# Patient Record
Sex: Female | Born: 1945 | Race: White | Hispanic: No | Marital: Married | Smoking: Former smoker
Health system: Southern US, Community
[De-identification: ages and names within clinical notes are randomized; demographics above are authoritative.]

## PROBLEM LIST (undated history)

## (undated) DIAGNOSIS — Z931 Gastrostomy status: Secondary | ICD-10-CM

## (undated) DIAGNOSIS — C069 Malignant neoplasm of mouth, unspecified: Secondary | ICD-10-CM

## (undated) DIAGNOSIS — E876 Hypokalemia: Secondary | ICD-10-CM

## (undated) DIAGNOSIS — E785 Hyperlipidemia, unspecified: Secondary | ICD-10-CM

## (undated) DIAGNOSIS — C799 Secondary malignant neoplasm of unspecified site: Secondary | ICD-10-CM

## (undated) DIAGNOSIS — I10 Essential (primary) hypertension: Secondary | ICD-10-CM

## (undated) DIAGNOSIS — E46 Unspecified protein-calorie malnutrition: Secondary | ICD-10-CM

## (undated) DIAGNOSIS — E079 Disorder of thyroid, unspecified: Secondary | ICD-10-CM

## (undated) DIAGNOSIS — E039 Hypothyroidism, unspecified: Secondary | ICD-10-CM

## (undated) DIAGNOSIS — R1310 Dysphagia, unspecified: Secondary | ICD-10-CM

## (undated) DIAGNOSIS — I739 Peripheral vascular disease, unspecified: Secondary | ICD-10-CM

## (undated) HISTORY — DX: Essential (primary) hypertension: I10

## (undated) HISTORY — DX: Disorder of thyroid, unspecified: E07.9

## (undated) HISTORY — DX: Secondary malignant neoplasm of unspecified site: C79.9

## (undated) HISTORY — DX: Unspecified protein-calorie malnutrition: E46

## (undated) HISTORY — DX: Gastrostomy status: Z93.1

## (undated) HISTORY — PX: FINGER SURGERY: SHX640

## (undated) HISTORY — DX: Malignant neoplasm of mouth, unspecified: C06.9

## (undated) HISTORY — PX: ABDOMINAL HYSTERECTOMY: SHX81

## (undated) HISTORY — DX: Hypokalemia: E87.6

## (undated) HISTORY — DX: Dysphagia, unspecified: R13.10

---

## 2005-03-24 ENCOUNTER — Ambulatory Visit: Payer: Self-pay

## 2005-04-07 ENCOUNTER — Ambulatory Visit: Payer: Self-pay | Admitting: Urology

## 2005-08-24 ENCOUNTER — Ambulatory Visit: Payer: Self-pay | Admitting: Unknown Physician Specialty

## 2005-09-08 ENCOUNTER — Ambulatory Visit: Payer: Self-pay | Admitting: Unknown Physician Specialty

## 2005-11-12 ENCOUNTER — Ambulatory Visit: Payer: Self-pay | Admitting: Internal Medicine

## 2006-12-29 ENCOUNTER — Ambulatory Visit: Payer: Self-pay | Admitting: Urology

## 2007-01-05 ENCOUNTER — Ambulatory Visit: Payer: Self-pay | Admitting: Internal Medicine

## 2011-07-17 ENCOUNTER — Ambulatory Visit: Payer: Self-pay | Admitting: Internal Medicine

## 2012-08-22 ENCOUNTER — Ambulatory Visit: Payer: Self-pay | Admitting: Internal Medicine

## 2013-07-21 ENCOUNTER — Ambulatory Visit: Payer: Self-pay | Admitting: Internal Medicine

## 2014-01-09 DIAGNOSIS — E039 Hypothyroidism, unspecified: Secondary | ICD-10-CM | POA: Insufficient documentation

## 2014-05-21 DIAGNOSIS — H5210 Myopia, unspecified eye: Secondary | ICD-10-CM | POA: Diagnosis not present

## 2014-05-21 DIAGNOSIS — H524 Presbyopia: Secondary | ICD-10-CM | POA: Diagnosis not present

## 2014-07-18 DIAGNOSIS — Z720 Tobacco use: Secondary | ICD-10-CM | POA: Diagnosis not present

## 2014-07-18 DIAGNOSIS — E782 Mixed hyperlipidemia: Secondary | ICD-10-CM | POA: Diagnosis not present

## 2014-07-18 DIAGNOSIS — I10 Essential (primary) hypertension: Secondary | ICD-10-CM | POA: Diagnosis not present

## 2014-07-25 ENCOUNTER — Other Ambulatory Visit: Payer: Self-pay | Admitting: Internal Medicine

## 2014-07-25 DIAGNOSIS — Z720 Tobacco use: Secondary | ICD-10-CM | POA: Diagnosis not present

## 2014-07-25 DIAGNOSIS — E780 Pure hypercholesterolemia: Secondary | ICD-10-CM | POA: Diagnosis not present

## 2014-07-25 DIAGNOSIS — Z1231 Encounter for screening mammogram for malignant neoplasm of breast: Secondary | ICD-10-CM

## 2014-07-25 DIAGNOSIS — R0989 Other specified symptoms and signs involving the circulatory and respiratory systems: Secondary | ICD-10-CM | POA: Insufficient documentation

## 2014-07-25 DIAGNOSIS — I10 Essential (primary) hypertension: Secondary | ICD-10-CM | POA: Diagnosis not present

## 2014-07-25 DIAGNOSIS — I739 Peripheral vascular disease, unspecified: Secondary | ICD-10-CM | POA: Diagnosis not present

## 2014-08-07 ENCOUNTER — Other Ambulatory Visit: Payer: Self-pay | Admitting: Internal Medicine

## 2014-08-07 ENCOUNTER — Ambulatory Visit
Admission: RE | Admit: 2014-08-07 | Discharge: 2014-08-07 | Disposition: A | Discharge status: Home / Self Care | Payer: Commercial Managed Care - HMO | Source: Ambulatory Visit | Attending: Internal Medicine | Admitting: Internal Medicine

## 2014-08-07 DIAGNOSIS — Z1231 Encounter for screening mammogram for malignant neoplasm of breast: Secondary | ICD-10-CM | POA: Insufficient documentation

## 2014-09-07 DIAGNOSIS — I714 Abdominal aortic aneurysm, without rupture: Secondary | ICD-10-CM | POA: Diagnosis not present

## 2014-09-07 DIAGNOSIS — I739 Peripheral vascular disease, unspecified: Secondary | ICD-10-CM | POA: Diagnosis not present

## 2014-09-07 DIAGNOSIS — I70219 Atherosclerosis of native arteries of extremities with intermittent claudication, unspecified extremity: Secondary | ICD-10-CM | POA: Diagnosis not present

## 2015-01-25 DIAGNOSIS — F17200 Nicotine dependence, unspecified, uncomplicated: Secondary | ICD-10-CM | POA: Diagnosis not present

## 2015-01-25 DIAGNOSIS — I739 Peripheral vascular disease, unspecified: Secondary | ICD-10-CM | POA: Diagnosis not present

## 2015-01-25 DIAGNOSIS — I10 Essential (primary) hypertension: Secondary | ICD-10-CM | POA: Diagnosis not present

## 2015-01-25 DIAGNOSIS — Z139 Encounter for screening, unspecified: Secondary | ICD-10-CM | POA: Diagnosis not present

## 2015-01-25 DIAGNOSIS — E7800 Pure hypercholesterolemia, unspecified: Secondary | ICD-10-CM | POA: Diagnosis not present

## 2015-06-12 ENCOUNTER — Other Ambulatory Visit: Payer: Self-pay | Admitting: Internal Medicine

## 2015-06-12 DIAGNOSIS — I10 Essential (primary) hypertension: Secondary | ICD-10-CM | POA: Diagnosis not present

## 2015-06-12 DIAGNOSIS — R220 Localized swelling, mass and lump, head: Secondary | ICD-10-CM

## 2015-06-12 DIAGNOSIS — E7800 Pure hypercholesterolemia, unspecified: Secondary | ICD-10-CM | POA: Diagnosis not present

## 2015-06-12 DIAGNOSIS — E039 Hypothyroidism, unspecified: Secondary | ICD-10-CM | POA: Diagnosis not present

## 2015-06-12 DIAGNOSIS — Z720 Tobacco use: Secondary | ICD-10-CM | POA: Diagnosis not present

## 2015-06-13 ENCOUNTER — Ambulatory Visit
Admission: RE | Admit: 2015-06-13 | Discharge: 2015-06-13 | Disposition: A | Discharge status: Home / Self Care | Payer: Commercial Managed Care - HMO | Source: Ambulatory Visit | Attending: Internal Medicine | Admitting: Internal Medicine

## 2015-06-13 DIAGNOSIS — R220 Localized swelling, mass and lump, head: Secondary | ICD-10-CM | POA: Diagnosis not present

## 2015-06-13 DIAGNOSIS — C029 Malignant neoplasm of tongue, unspecified: Secondary | ICD-10-CM | POA: Diagnosis not present

## 2015-06-13 DIAGNOSIS — R938 Abnormal findings on diagnostic imaging of other specified body structures: Secondary | ICD-10-CM | POA: Diagnosis not present

## 2015-06-13 DIAGNOSIS — K148 Other diseases of tongue: Secondary | ICD-10-CM | POA: Diagnosis not present

## 2015-06-13 DIAGNOSIS — I2510 Atherosclerotic heart disease of native coronary artery without angina pectoris: Secondary | ICD-10-CM | POA: Diagnosis not present

## 2015-06-13 MED ORDER — IOPAMIDOL (ISOVUE-300) INJECTION 61%
75.0000 mL | Freq: Once | INTRAVENOUS | Status: AC | PRN
  Administered 2015-06-13: 75 mL via INTRAVENOUS

## 2015-06-18 ENCOUNTER — Ambulatory Visit: Payer: Commercial Managed Care - HMO | Admitting: Internal Medicine

## 2015-06-18 ENCOUNTER — Telehealth: Payer: Self-pay | Admitting: *Deleted

## 2015-06-18 DIAGNOSIS — E785 Hyperlipidemia, unspecified: Secondary | ICD-10-CM | POA: Insufficient documentation

## 2015-06-18 DIAGNOSIS — I10 Essential (primary) hypertension: Secondary | ICD-10-CM | POA: Insufficient documentation

## 2015-06-18 DIAGNOSIS — Z720 Tobacco use: Secondary | ICD-10-CM | POA: Insufficient documentation

## 2015-06-18 NOTE — Telephone Encounter (Addendum)
Per v/o Dr. Rogue Bussing, called [facility]. Left msg for Dr. Linton Ham nurse to contact cancer center to coordinate patient's care. Pt needs an ENT referral as soon as possible to evaluate mandible mass. Dr. Thomes Lolling will like Dr. Ginette Pitman to be ordering md to initiate this as the cancer center has not seen pt yet. Pt has an apt with Dr. Rogue Bussing on Friday. Pt was notified by scheduling yesterday about her apt. Pt made the appointment for Friday 4/717. Office will give Dr. Linton Ham nurse msg to have nurse contact Renita Papa, RN @ cancer center asap.

## 2015-06-21 ENCOUNTER — Encounter: Payer: Self-pay | Admitting: Internal Medicine

## 2015-06-21 ENCOUNTER — Inpatient Hospital Stay: Payer: Commercial Managed Care - HMO | Attending: Internal Medicine | Admitting: Internal Medicine

## 2015-06-21 ENCOUNTER — Ambulatory Visit: Payer: Commercial Managed Care - HMO

## 2015-06-21 VITALS — BP 148/81 | HR 65 | Temp 97.7°F | Ht 61.0 in | Wt 99.4 lb

## 2015-06-21 DIAGNOSIS — R590 Localized enlarged lymph nodes: Secondary | ICD-10-CM

## 2015-06-21 DIAGNOSIS — M4802 Spinal stenosis, cervical region: Secondary | ICD-10-CM | POA: Diagnosis not present

## 2015-06-21 DIAGNOSIS — R630 Anorexia: Secondary | ICD-10-CM | POA: Diagnosis not present

## 2015-06-21 DIAGNOSIS — I10 Essential (primary) hypertension: Secondary | ICD-10-CM

## 2015-06-21 DIAGNOSIS — C049 Malignant neoplasm of floor of mouth, unspecified: Secondary | ICD-10-CM | POA: Insufficient documentation

## 2015-06-21 DIAGNOSIS — Z79899 Other long term (current) drug therapy: Secondary | ICD-10-CM | POA: Insufficient documentation

## 2015-06-21 DIAGNOSIS — F418 Other specified anxiety disorders: Secondary | ICD-10-CM

## 2015-06-21 DIAGNOSIS — Z87891 Personal history of nicotine dependence: Secondary | ICD-10-CM

## 2015-06-21 DIAGNOSIS — I6522 Occlusion and stenosis of left carotid artery: Secondary | ICD-10-CM | POA: Diagnosis not present

## 2015-06-21 DIAGNOSIS — R634 Abnormal weight loss: Secondary | ICD-10-CM

## 2015-06-21 DIAGNOSIS — E039 Hypothyroidism, unspecified: Secondary | ICD-10-CM | POA: Diagnosis not present

## 2015-06-21 DIAGNOSIS — C029 Malignant neoplasm of tongue, unspecified: Secondary | ICD-10-CM

## 2015-06-21 DIAGNOSIS — R221 Localized swelling, mass and lump, neck: Secondary | ICD-10-CM

## 2015-06-21 LAB — CBC WITH DIFFERENTIAL/PLATELET
BASOS ABS: 0.1 10*3/uL (ref 0–0.1)
Basophils Relative: 1 %
Eosinophils Absolute: 0.1 10*3/uL (ref 0–0.7)
Eosinophils Relative: 1 %
HEMATOCRIT: 37.5 % (ref 35.0–47.0)
Hemoglobin: 13.4 g/dL (ref 12.0–16.0)
LYMPHS PCT: 17 %
Lymphs Abs: 2 10*3/uL (ref 1.0–3.6)
MCH: 32.3 pg (ref 26.0–34.0)
MCHC: 35.8 g/dL (ref 32.0–36.0)
MCV: 90.1 fL (ref 80.0–100.0)
Monocytes Absolute: 0.7 10*3/uL (ref 0.2–0.9)
Monocytes Relative: 6 %
NEUTROS ABS: 9 10*3/uL — AB (ref 1.4–6.5)
Neutrophils Relative %: 75 %
Platelets: 313 10*3/uL (ref 150–440)
RBC: 4.16 MIL/uL (ref 3.80–5.20)
RDW: 12.5 % (ref 11.5–14.5)
WBC: 11.9 10*3/uL — AB (ref 3.6–11.0)

## 2015-06-21 LAB — COMPREHENSIVE METABOLIC PANEL
ALT: 17 U/L (ref 14–54)
AST: 22 U/L (ref 15–41)
Albumin: 4.9 g/dL (ref 3.5–5.0)
Alkaline Phosphatase: 74 U/L (ref 38–126)
Anion gap: 9 (ref 5–15)
BILIRUBIN TOTAL: 0.8 mg/dL (ref 0.3–1.2)
BUN: 16 mg/dL (ref 6–20)
CO2: 26 mmol/L (ref 22–32)
Calcium: 9 mg/dL (ref 8.9–10.3)
Chloride: 91 mmol/L — ABNORMAL LOW (ref 101–111)
Creatinine, Ser: 0.72 mg/dL (ref 0.44–1.00)
GFR calc Af Amer: >60 mL/min (ref >60)
GFR calc non Af Amer: >60 mL/min (ref >60)
Glucose, Bld: 108 mg/dL — ABNORMAL HIGH (ref 65–99)
POTASSIUM: 3.9 mmol/L (ref 3.5–5.1)
Sodium: 126 mmol/L — ABNORMAL LOW (ref 135–145)
TOTAL PROTEIN: 8.1 g/dL (ref 6.5–8.1)

## 2015-06-21 NOTE — Progress Notes (Signed)
Patient stated that she was not sure why she was seeing Dr at [facility] and that Dr. Ginette Pitman had not told her was cancer.  She stated that she was anxious.

## 2015-06-21 NOTE — Progress Notes (Signed)
[facility] CONSULT NOTE  Patient Care Team: Tracie Harrier, MD as PCP - General (Internal Medicine)  CHIEF COMPLAINTS/PURPOSE OF CONSULTATION:   # April 2017- Left Tongue base Lesion/Left neck adenopathy [left tongue/ floor of mouth- 4 x 3 x 2 cm.; 4.3 x 3.3 x 3 cm complex mass extends through the left mylohyoid muscle below the left mandible anterior to left submandibular gland suspicious for necrotic metastatic adenopathy.]  # Smoker   HISTORY OF PRESENTING ILLNESS:  Rebecca Mcmillan 70 y.o.  female with long-standing history of smoking noted to have a lump in the left side of the neck for the last 3 months or so. It had been waxing and waning. However in the last few weeks Noted to have worsening left side of the neck lump.  She is a poor appetite/lost about 10 pounds in the last few months which she attributes to anxiety/depression since her mother passed away.   Otherwise no nausea no vomiting. She denies any pain. Denies any difficulty swallowing pain with swallowing or any hoarseness of voice. Denies any shortness of breath or chest pain.   ROS: A complete 10 point review of system is done which is negative except mentioned above in history of present illness  MEDICAL HISTORY:  Past Medical History  Diagnosis Date  . Hypertension   . Thyroid disease     SURGICAL HISTORY: None  SOCIAL HISTORY: Patient previously used to work in nursing homes. Denies any alcohol. Smokes a pack of Celexa day for 45 years lives with her husband in [place]. No children.  Social History   Social History  . Marital Status: Married    Spouse Name: N/A  . Number of Children: N/A  . Years of Education: N/A   Occupational History  . Not on file.   Social History Main Topics  . Smoking status: Former Smoker -- 1.00 packs/day for 45 years    Types: Cigarettes    Quit date: 06/19/2015  . Smokeless tobacco: Not on file  . Alcohol Use: Not on file  . Drug Use: Not on file  .  Sexual Activity: Not on file   Other Topics Concern  . Not on file   Social History Narrative    FAMILY HISTORY:no family history of head and neck cancer.   ALLERGIES:  is allergic to lactose intolerance (gi) and tetracyclines & related.  MEDICATIONS:  Current Outpatient Prescriptions  Medication Sig Dispense Refill  . atenolol (TENORMIN) 25 MG tablet     . levothyroxine (SYNTHROID, LEVOTHROID) 50 MCG tablet Take by mouth.    . losartan-hydrochlorothiazide (HYZAAR) 50-12.5 MG tablet     . lovastatin (MEVACOR) 40 MG tablet      No current facility-administered medications for this visit.      Marland Kitchen  PHYSICAL EXAMINATION: ECOG PERFORMANCE STATUS: 1 - Symptomatic but completely ambulatory  Filed Vitals:   06/21/15 0925  BP: 148/81  Pulse: 65  Temp: 97.7 F (36.5 C)   Filed Weights   06/21/15 0925  Weight: 99 lb 6.8 oz (45.1 kg)    GENERAL: Thin built moderately nourished Caucasian female patient anxious. Alert, no distress and comfortable. She is alone.  EYES: no pallor or icterus OROPHARYNX: no thrush or ulceration; poor dentition. No obvious masses noted in the oral cavity. NECK: supple, 4-5 cm mass/firm- submandibular mass. Non-tender.  LYMPH:  no palpable lymphadenopathy xillary or inguinal regions LUNGS: Decreased breath sounds bilaterally No wheeze or crackles; barrel chested.  HEART/CVS: regular rate &  rhythm and no murmurs; No lower extremity edema ABDOMEN: abdomen soft, non-tender and normal bowel sounds Musculoskeletal:no cyanosis of digits and no clubbing  PSYCH: alert & oriented x 3 with fluent speech NEURO: no focal motor/sensory deficits SKIN:  no rashes or significant lesions  LABORATORY DATA:  I have reviewed the data as listed Lab Results  Component Value Date   WBC 11.9* 06/21/2015   HGB 13.4 06/21/2015   HCT 37.5 06/21/2015   MCV 90.1 06/21/2015   PLT 313 06/21/2015    Recent Labs  06/21/15 1029  NA 126*  K 3.9  CL 91*  CO2 26   GLUCOSE 108*  BUN 16  CREATININE 0.72  CALCIUM 9.0  GFRNONAA >60  GFRAA >60  PROT 8.1  ALBUMIN 4.9  AST 22  ALT 17  ALKPHOS 74  BILITOT 0.8    RADIOGRAPHIC STUDIES: I have personally reviewed the radiological images as listed and agreed with the findings in the report. Ct Maxillofacial W/cm  06/13/2015  CLINICAL DATA:  70 year old female with mass left mandible since February. Initial encounter. EXAM: CT MAXILLOFACIAL WITH CONTRAST TECHNIQUE: Multidetector CT imaging of the maxillofacial structures was performed with intravenous contrast. Multiplanar CT image reconstructions were also generated. A small metallic BB was placed on the right temple in order to reliably differentiate right from left. CONTRAST:  107mL ISOVUE-300 IOPAMIDOL (ISOVUE-300) INJECTION 61% COMPARISON:  None. FINDINGS: Primary malignancy within the left tongue/ floor of mouth suspected spanning over 4 x 3 x 2 cm. 4.3 x 3.3 x 3 cm complex mass extends through the left mylohyoid muscle below the left mandible anterior to left submandibular gland suspicious for necrotic metastatic adenopathy. Extracapsular spread may be present. At points is is inseparable from the submandibular gland. Small metastatic lymph nodes bilaterally sublingual level 1 region larger on the left measuring up to 1.2 cm. Metastatic necrotic adenopathy left level 2 and 3 region largest in the left level 2A region measuring up to 1.4 cm. Infectious process felt to be unlikely as cause for above described findings. Atherosclerotic changes with carotid bifurcation and internal carotid artery calcification narrowing greater on the left. Evaluation limited by streak artifact however left internal carotid artery stenosis may be hemodynamically significant. 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 IMPRESSION: Primary malignancy within the left tongue/ floor of mouth suspected  spanning over 4 x 3 x 2 cm. 4.3 x 3.3 x 3 cm complex mass extends through the left mylohyoid muscle below the left mandible anterior to left submandibular gland suspicious for necrotic metastatic adenopathy. Extracapsular spread may be present. At points is is inseparable from the submandibular gland. Small metastatic lymph nodes bilaterally sublingual level 1 region larger on the left measuring up to 1.2 cm. Metastatic necrotic adenopathy left level 2 and 3 region largest in the left level 2A region measuring up to 1.4 cm. Atherosclerotic changes with carotid bifurcation and internal carotid artery calcification narrowing greater on the left. Evaluation limited by streak artifact however left internal carotid artery stenosis may be hemodynamically significant. These results will be called to the ordering clinician or representative by the Radiologist Assistant, and communication documented in the PACS or zVision Dashboard. Electronically Signed   By: Genia Del M.D.   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 Concerning for primary base of tongue primary with metastatic disease bilateral neck. I  reviewed my concerns with the patient detailed. I recommend ENT evaluation. I had spoken to Dr. Tami Ribas. Awaiting a referral from primary care physician.  # Discussed with the patient the treatment options include surgery if feasible; if not concurrent chemoradiation therapy would be recommended. If surgery is feasible- she might need adjuvant chemoradiation therapy also.   # I recommend CBC CMP PET scan- for further evaluation.   # Patient follow-up with me in approximately few days after the PET scan.  # weight loss- 10 pounds- patient might need a feeding tube in future.  Thank you Dr. Ginette Pitman for allowing me to participate in the care of your pleasant patient. Please do not hesitate to contact me with questions or concerns  in the interim.  # 45 minutes face-to-face with the patient discussing the above plan of care; more than 50% of time spent on prognosis/ natural history; counseling and coordination.   Cammie Sickle, MD 06/21/2015 2:42 PM

## 2015-06-21 NOTE — Telephone Encounter (Signed)
Called back to [facility] to verify that ENT ref Josem Kaufmann was initiated. RN Spoke with Dr. Linton Ham nurse. ENT refer auth. Not yet initiated per Dr. Linton Ham nurse. I explained the reason for expediting the ref to ENT.  I also asked if pet scan will need a prior auth from Dr. Ginette Pitman. She states that as long as the referral auth was sent to cancer center, then the pet scan does not need prior auth from Dr. Linton Ham office.

## 2015-06-25 ENCOUNTER — Telehealth: Payer: Self-pay | Admitting: *Deleted

## 2015-06-25 NOTE — Telephone Encounter (Signed)
Per [facility], Pt had appt on April 10 th, but pt call and cancel on April 7th.  Left vm.  I called the patient to inquire why she did not keep her ENT apt. I gave her the cancer center's phone number to call our office back as soon possible. I also provided  ENT's direct phone number so that she could r/s this apt asap.

## 2015-06-25 NOTE — Telephone Encounter (Signed)
Pt returned phone call. She states that she missed the ENT apt on Monday due to a "court date."  She states that she will call ENT back now and make the apt with ENT.

## 2015-06-26 ENCOUNTER — Ambulatory Visit
Admission: RE | Admit: 2015-06-26 | Discharge: 2015-06-26 | Disposition: A | Discharge status: Home / Self Care | Payer: Commercial Managed Care - HMO | Source: Ambulatory Visit | Attending: Internal Medicine | Admitting: Internal Medicine

## 2015-06-26 DIAGNOSIS — C029 Malignant neoplasm of tongue, unspecified: Secondary | ICD-10-CM

## 2015-06-26 DIAGNOSIS — E785 Hyperlipidemia, unspecified: Secondary | ICD-10-CM | POA: Diagnosis not present

## 2015-06-26 DIAGNOSIS — I7090 Unspecified atherosclerosis: Secondary | ICD-10-CM | POA: Insufficient documentation

## 2015-06-26 DIAGNOSIS — I2510 Atherosclerotic heart disease of native coronary artery without angina pectoris: Secondary | ICD-10-CM | POA: Diagnosis not present

## 2015-06-26 DIAGNOSIS — I10 Essential (primary) hypertension: Secondary | ICD-10-CM | POA: Diagnosis not present

## 2015-06-26 DIAGNOSIS — Z0189 Encounter for other specified special examinations: Secondary | ICD-10-CM | POA: Diagnosis present

## 2015-06-26 DIAGNOSIS — R001 Bradycardia, unspecified: Secondary | ICD-10-CM | POA: Diagnosis not present

## 2015-06-26 DIAGNOSIS — C778 Secondary and unspecified malignant neoplasm of lymph nodes of multiple regions: Secondary | ICD-10-CM | POA: Insufficient documentation

## 2015-06-26 DIAGNOSIS — D102 Benign neoplasm of floor of mouth: Secondary | ICD-10-CM | POA: Diagnosis not present

## 2015-06-26 LAB — GLUCOSE, CAPILLARY: GLUCOSE-CAPILLARY: 89 mg/dL (ref 65–99)

## 2015-06-26 MED ORDER — FLUDEOXYGLUCOSE F - 18 (FDG) INJECTION
12.1600 | Freq: Once | INTRAVENOUS | Status: AC | PRN
  Administered 2015-06-26: 12.16 via INTRAVENOUS

## 2015-06-27 NOTE — Telephone Encounter (Signed)
Patient seen yesterday 06-26-15 by Dr. Tami Ribas.

## 2015-06-28 ENCOUNTER — Inpatient Hospital Stay: Payer: Commercial Managed Care - HMO | Admitting: Internal Medicine

## 2015-06-28 ENCOUNTER — Encounter
Admission: RE | Admit: 2015-06-28 | Discharge: 2015-06-28 | Disposition: A | Discharge status: Home / Self Care | Payer: Commercial Managed Care - HMO | Source: Ambulatory Visit | Attending: Unknown Physician Specialty | Admitting: Unknown Physician Specialty

## 2015-06-28 DIAGNOSIS — E785 Hyperlipidemia, unspecified: Secondary | ICD-10-CM | POA: Diagnosis not present

## 2015-06-28 DIAGNOSIS — I7090 Unspecified atherosclerosis: Secondary | ICD-10-CM | POA: Diagnosis not present

## 2015-06-28 DIAGNOSIS — C778 Secondary and unspecified malignant neoplasm of lymph nodes of multiple regions: Secondary | ICD-10-CM | POA: Diagnosis not present

## 2015-06-28 DIAGNOSIS — R001 Bradycardia, unspecified: Secondary | ICD-10-CM | POA: Diagnosis not present

## 2015-06-28 DIAGNOSIS — I2510 Atherosclerotic heart disease of native coronary artery without angina pectoris: Secondary | ICD-10-CM | POA: Diagnosis not present

## 2015-06-28 DIAGNOSIS — C029 Malignant neoplasm of tongue, unspecified: Secondary | ICD-10-CM | POA: Diagnosis not present

## 2015-06-28 DIAGNOSIS — I10 Essential (primary) hypertension: Secondary | ICD-10-CM | POA: Diagnosis not present

## 2015-06-28 HISTORY — DX: Hyperlipidemia, unspecified: E78.5

## 2015-06-28 HISTORY — DX: Hypothyroidism, unspecified: E03.9

## 2015-06-28 LAB — BASIC METABOLIC PANEL
ANION GAP: 7 (ref 5–15)
BUN: 13 mg/dL (ref 6–20)
CALCIUM: 8.8 mg/dL — AB (ref 8.9–10.3)
CHLORIDE: 103 mmol/L (ref 101–111)
CO2: 24 mmol/L (ref 22–32)
Creatinine, Ser: 0.74 mg/dL (ref 0.44–1.00)
GFR calc Af Amer: >60 mL/min (ref >60)
GFR calc non Af Amer: >60 mL/min (ref >60)
Glucose, Bld: 93 mg/dL (ref 65–99)
POTASSIUM: 3.8 mmol/L (ref 3.5–5.1)
Sodium: 134 mmol/L — ABNORMAL LOW (ref 135–145)

## 2015-06-28 NOTE — Patient Instructions (Signed)
Your procedure is scheduled on: Tuesday 07/02/15 Report to Day Surgery. 2ND FLOOR MEDICAL MALL ENTRANCE To find out your arrival time please call [phone] between 1PM - 3PM on Monday 07/01/15.  Remember: Instructions that are not followed completely may result in serious medical risk, up to and including death, or upon the discretion of your surgeon and anesthesiologist your surgery may need to be rescheduled.    __X__ 1. Do not eat food or drink liquids after midnight. No gum chewing or hard candies.     __X__ 2. No Alcohol for 24 hours before or after surgery.   ____ 3. Bring all medications with you on the day of surgery if instructed.    __X__ 4. Notify your doctor if there is any change in your medical condition     (cold, fever, infections).     Do not wear jewelry, make-up, hairpins, clips or nail polish.  Do not wear lotions, powders, or perfumes.   Do not shave 48 hours prior to surgery. Men may shave face and neck.  Do not bring valuables to the hospital.    [facility] is not responsible for any belongings or valuables.               Contacts, dentures or bridgework may not be worn into surgery.  Leave your suitcase in the car. After surgery it may be brought to your room.  For patients admitted to the hospital, discharge time is determined by your                treatment team.   Patients discharged the day of surgery will not be allowed to drive home.   Please read over the following fact sheets that you were given:   Surgical Site Infection Prevention   __X__ Take these medicines the morning of surgery with A SIP OF WATER:    1. ATENOLOL  2. LEVOTHYROXINE  3.   4.  5.  6.  ____ Fleet Enema (as directed)   ____ Use CHG Soap as directed  ____ Use inhalers on the day of surgery  ____ Stop metformin 2 days prior to surgery    ____ Take 1/2 of usual insulin dose the night before surgery and none on the morning of surgery.   __X__ Stop  Coumadin/Plavix/aspirin on ALREADY STOPPED  ____ Stop Anti-inflammatories on    ____ Stop supplements until after surgery.    ____ Bring C-Pap to the hospital.

## 2015-07-02 ENCOUNTER — Ambulatory Visit: Payer: Commercial Managed Care - HMO | Admitting: Certified Registered Nurse Anesthetist

## 2015-07-02 ENCOUNTER — Encounter: Payer: Self-pay | Admitting: *Deleted

## 2015-07-02 ENCOUNTER — Encounter
Admission: RE | Disposition: A | Discharge status: Home / Self Care | Payer: Self-pay | Source: Ambulatory Visit | Attending: Unknown Physician Specialty

## 2015-07-02 ENCOUNTER — Ambulatory Visit
Admission: RE | Admit: 2015-07-02 | Discharge: 2015-07-02 | Disposition: A | Discharge status: Home / Self Care | Payer: Commercial Managed Care - HMO | Source: Ambulatory Visit | Attending: Unknown Physician Specialty | Admitting: Unknown Physician Specialty

## 2015-07-02 DIAGNOSIS — Z8349 Family history of other endocrine, nutritional and metabolic diseases: Secondary | ICD-10-CM | POA: Diagnosis not present

## 2015-07-02 DIAGNOSIS — Z91018 Allergy to other foods: Secondary | ICD-10-CM | POA: Insufficient documentation

## 2015-07-02 DIAGNOSIS — E039 Hypothyroidism, unspecified: Secondary | ICD-10-CM | POA: Diagnosis not present

## 2015-07-02 DIAGNOSIS — C020 Malignant neoplasm of dorsal surface of tongue: Secondary | ICD-10-CM | POA: Diagnosis not present

## 2015-07-02 DIAGNOSIS — Z87891 Personal history of nicotine dependence: Secondary | ICD-10-CM | POA: Insufficient documentation

## 2015-07-02 DIAGNOSIS — Z825 Family history of asthma and other chronic lower respiratory diseases: Secondary | ICD-10-CM | POA: Insufficient documentation

## 2015-07-02 DIAGNOSIS — Z91011 Allergy to milk products: Secondary | ICD-10-CM | POA: Insufficient documentation

## 2015-07-02 DIAGNOSIS — Z8249 Family history of ischemic heart disease and other diseases of the circulatory system: Secondary | ICD-10-CM | POA: Diagnosis not present

## 2015-07-02 DIAGNOSIS — C048 Malignant neoplasm of overlapping sites of floor of mouth: Secondary | ICD-10-CM | POA: Diagnosis not present

## 2015-07-02 DIAGNOSIS — Z90710 Acquired absence of both cervix and uterus: Secondary | ICD-10-CM | POA: Insufficient documentation

## 2015-07-02 DIAGNOSIS — Z881 Allergy status to other antibiotic agents status: Secondary | ICD-10-CM | POA: Insufficient documentation

## 2015-07-02 DIAGNOSIS — I10 Essential (primary) hypertension: Secondary | ICD-10-CM | POA: Diagnosis not present

## 2015-07-02 DIAGNOSIS — C7989 Secondary malignant neoplasm of other specified sites: Secondary | ICD-10-CM | POA: Insufficient documentation

## 2015-07-02 DIAGNOSIS — Z833 Family history of diabetes mellitus: Secondary | ICD-10-CM | POA: Diagnosis not present

## 2015-07-02 DIAGNOSIS — E785 Hyperlipidemia, unspecified: Secondary | ICD-10-CM | POA: Insufficient documentation

## 2015-07-02 DIAGNOSIS — Z79899 Other long term (current) drug therapy: Secondary | ICD-10-CM | POA: Insufficient documentation

## 2015-07-02 DIAGNOSIS — C049 Malignant neoplasm of floor of mouth, unspecified: Secondary | ICD-10-CM | POA: Diagnosis not present

## 2015-07-02 DIAGNOSIS — R221 Localized swelling, mass and lump, neck: Secondary | ICD-10-CM | POA: Diagnosis not present

## 2015-07-02 DIAGNOSIS — C770 Secondary and unspecified malignant neoplasm of lymph nodes of head, face and neck: Secondary | ICD-10-CM | POA: Diagnosis not present

## 2015-07-02 HISTORY — PX: FINE NEEDLE ASPIRATION: SHX6590

## 2015-07-02 HISTORY — PX: TONGUE BIOPSY: SHX6575

## 2015-07-02 SURGERY — BIOPSY, TONGUE
Anesthesia: General | Wound class: Clean Contaminated

## 2015-07-02 MED ORDER — LACTATED RINGERS IV SOLN
INTRAVENOUS | Status: DC
  Administered 2015-07-02: 09:00:00 via INTRAVENOUS

## 2015-07-02 MED ORDER — DEXAMETHASONE SODIUM PHOSPHATE 10 MG/ML IJ SOLN
INTRAMUSCULAR | Status: DC | PRN
  Administered 2015-07-02: 5 mg via INTRAVENOUS

## 2015-07-02 MED ORDER — EPHEDRINE SULFATE 50 MG/ML IJ SOLN
INTRAMUSCULAR | Status: DC | PRN
  Administered 2015-07-02 (×2): 5 mg via INTRAVENOUS

## 2015-07-02 MED ORDER — PROPOFOL 10 MG/ML IV BOLUS
INTRAVENOUS | Status: DC | PRN
  Administered 2015-07-02: 110 mg via INTRAVENOUS

## 2015-07-02 MED ORDER — MIDAZOLAM HCL 2 MG/2ML IJ SOLN
INTRAMUSCULAR | Status: DC | PRN
  Administered 2015-07-02: 2 mg via INTRAVENOUS

## 2015-07-02 MED ORDER — GLYCOPYRROLATE 0.2 MG/ML IJ SOLN
INTRAMUSCULAR | Status: DC | PRN
  Administered 2015-07-02: 0.2 mg via INTRAVENOUS

## 2015-07-02 MED ORDER — LIDOCAINE HCL (CARDIAC) 20 MG/ML IV SOLN
INTRAVENOUS | Status: DC | PRN
  Administered 2015-07-02: 50 mg via INTRAVENOUS

## 2015-07-02 MED ORDER — ONDANSETRON HCL 4 MG/2ML IJ SOLN: 4.0000 mg | Freq: Once | INTRAMUSCULAR | Status: DC | PRN

## 2015-07-02 MED ORDER — SUCCINYLCHOLINE CHLORIDE 20 MG/ML IJ SOLN
INTRAMUSCULAR | Status: DC | PRN
  Administered 2015-07-02: 80 mg via INTRAVENOUS

## 2015-07-02 MED ORDER — FENTANYL CITRATE (PF) 100 MCG/2ML IJ SOLN
INTRAMUSCULAR | Status: DC | PRN
  Administered 2015-07-02: 50 ug via INTRAVENOUS
  Administered 2015-07-02 (×2): 25 ug via INTRAVENOUS

## 2015-07-02 MED ORDER — FENTANYL CITRATE (PF) 100 MCG/2ML IJ SOLN: 25.0000 ug | INTRAMUSCULAR | Status: DC | PRN

## 2015-07-02 MED ORDER — LIDOCAINE-EPINEPHRINE 1 %-1:100000 IJ SOLN
INTRAMUSCULAR | Status: DC | PRN
  Administered 2015-07-02: 4 mL

## 2015-07-02 MED ORDER — FAMOTIDINE 20 MG PO TABS
ORAL_TABLET | ORAL | Status: AC
  Administered 2015-07-02: 20 mg via ORAL
  Filled 2015-07-02: qty 1

## 2015-07-02 MED ORDER — SILVER NITRATE-POT NITRATE 75-25 % EX MISC
CUTANEOUS | Status: AC
  Filled 2015-07-02: qty 1

## 2015-07-02 MED ORDER — FAMOTIDINE 20 MG PO TABS
20.0000 mg | ORAL_TABLET | Freq: Once | ORAL | Status: AC
  Administered 2015-07-02: 20 mg via ORAL

## 2015-07-02 MED ORDER — LIDOCAINE-EPINEPHRINE 1 %-1:100000 IJ SOLN
INTRAMUSCULAR | Status: AC
  Filled 2015-07-02: qty 1

## 2015-07-02 MED ORDER — ONDANSETRON HCL 4 MG/2ML IJ SOLN
INTRAMUSCULAR | Status: DC | PRN
  Administered 2015-07-02: 4 mg via INTRAVENOUS

## 2015-07-02 MED ORDER — OXYCODONE-ACETAMINOPHEN 5-325 MG PO TABS: 1.0000 | ORAL_TABLET | ORAL | Status: DC | PRN

## 2015-07-02 MED ORDER — ROCURONIUM BROMIDE 100 MG/10ML IV SOLN
INTRAVENOUS | Status: DC | PRN
  Administered 2015-07-02: 10 mg via INTRAVENOUS

## 2015-07-02 MED ORDER — BUPIVACAINE-EPINEPHRINE (PF) 0.5% -1:200000 IJ SOLN
INTRAMUSCULAR | Status: AC
  Filled 2015-07-02: qty 30

## 2015-07-02 SURGICAL SUPPLY — 24 items
APPLICATOR COTTON TIP 3IN (MISCELLANEOUS) ×3 IMPLANT
BLADE SURG 15 STRL LF DISP TIS (BLADE) ×1 IMPLANT
BLADE SURG 15 STRL SS (BLADE) ×2
BNDG ADH 2 X3.75 FABRIC TAN LF (GAUZE/BANDAGES/DRESSINGS) ×3 IMPLANT
CANISTER SUCT 1200ML W/VALVE (MISCELLANEOUS) ×3 IMPLANT
COAG SUCT 10F 3.5MM HAND CTRL (MISCELLANEOUS) IMPLANT
COAGULATOR SUCT 8FR VV (MISCELLANEOUS) IMPLANT
COVER MAYO STAND STRL (DRAPES) ×3 IMPLANT
COVER PROBE ULTRASOUND XLONG L (MISCELLANEOUS) IMPLANT
ELECT CAUTERY BLADE 6.4 (BLADE) ×3 IMPLANT
ELECT CAUTERY NEEDLE 2.0 MIC (NEEDLE) ×3 IMPLANT
ELECT REM PT RETURN 9FT ADLT (ELECTROSURGICAL) ×3
ELECTRODE REM PT RTRN 9FT ADLT (ELECTROSURGICAL) ×1 IMPLANT
GAUZE SPONGE 4X4 12PLY STRL (GAUZE/BANDAGES/DRESSINGS) ×3 IMPLANT
GLOVE BIO SURGEON STRL SZ7.5 (GLOVE) ×3 IMPLANT
GOWN STRL REUS W/ TWL LRG LVL3 (GOWN DISPOSABLE) ×2 IMPLANT
GOWN STRL REUS W/TWL LRG LVL3 (GOWN DISPOSABLE) ×4
NEEDLE HYPO 25GX1X1/2 BEV (NEEDLE) ×9 IMPLANT
NS IRRIG 500ML POUR BTL (IV SOLUTION) ×3 IMPLANT
PACK HEAD/NECK (MISCELLANEOUS) ×3 IMPLANT
SCRUB POVIDONE IODINE 4 OZ (MISCELLANEOUS) IMPLANT
SUT SILK 2 0 SH (SUTURE) ×6 IMPLANT
SUT VIC AB 4-0 RB1 18 (SUTURE) ×3 IMPLANT
SYR 3ML LL SCALE MARK (SYRINGE) ×9 IMPLANT

## 2015-07-02 NOTE — Anesthesia Preprocedure Evaluation (Addendum)
Anesthesia Evaluation  Patient identified by MRN, date of birth, ID band Patient awake    Reviewed: Allergy & Precautions, NPO status , Patient's Chart, lab work & pertinent test results, reviewed documented beta blocker date and time   Airway Mallampati: II  TM Distance: >3 FB     Dental  (+) Chipped, Upper Dentures, Dental Advisory Given, Missing   Pulmonary former smoker,           Cardiovascular hypertension, Pt. on medications and Pt. on home beta blockers      Neuro/Psych    GI/Hepatic   Endo/Other  Hypothyroidism   Renal/GU      Musculoskeletal   Abdominal   Peds  Hematology   Anesthesia Other Findings EKG OK.  Reproductive/Obstetrics                            Anesthesia Physical Anesthesia Plan  ASA: II  Anesthesia Plan: General   Post-op Pain Management:    Induction: Intravenous  Airway Management Planned: Oral ETT  Additional Equipment:   Intra-op Plan:   Post-operative Plan:   Informed Consent: I have reviewed the patients History and Physical, chart, labs and discussed the procedure including the risks, benefits and alternatives for the proposed anesthesia with the patient or authorized representative who has indicated his/her understanding and acceptance.     Plan Discussed with: CRNA  Anesthesia Plan Comments:         Anesthesia Quick Evaluation

## 2015-07-02 NOTE — Anesthesia Postprocedure Evaluation (Signed)
Anesthesia Post Note  Patient: Rebecca Mcmillan  Procedure(s) Performed: Procedure(s) (LRB): TONGUE BIOPSY (N/A) FINE NEEDLE ASPIRATION (N/A)  Patient location during evaluation: PACU Anesthesia Type: General Level of consciousness: awake and alert Pain management: pain level controlled Vital Signs Assessment: post-procedure vital signs reviewed and stable Respiratory status: spontaneous breathing, nonlabored ventilation, respiratory function stable and patient connected to nasal cannula oxygen Cardiovascular status: blood pressure returned to baseline and stable Postop Assessment: no signs of nausea or vomiting Anesthetic complications: no    Last Vitals:  Filed Vitals:   07/02/15 1129 07/02/15 1151  BP: 159/76 168/72  Pulse: 58 54  Temp: 36.4 C   Resp: 18     Last Pain:  Filed Vitals:   07/02/15 1202  PainSc: 2                  [name] S

## 2015-07-02 NOTE — H&P (Signed)
  H+P  Reviewed and will be scanned in later. No changes noted. 

## 2015-07-02 NOTE — Progress Notes (Signed)
Left base of tongue is purplish in color from bruising.  No sutures noted.  No dressings.

## 2015-07-02 NOTE — Discharge Instructions (Signed)

## 2015-07-02 NOTE — Op Note (Signed)
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 After general endotracheal anesthesia the table was turned 90. Examination oral cavity showed a firm mass involving the tongue which extended past midline appeared to originate from the left floor mouth was an ulcerative lesion in the left lateral gutter multiple biopsies were taken of this area and sent for frozen section. Palpation tongue showed a firm mass involving the nearly entire left side of tongue extending past midline floor mouth was involved from the left posterior gutter anteriorly up to the Warthin's duct. A local anesthetic of 1% lidocaine with 1 100,000 units of epinephrine was used to inject this left lateral floor mouth area total of 4 cc was used. A cotton sponges placed in the floor mouth while we were waiting on the frozen sections to return the left neck was prepped sterilely there was a large cystic mass measuring approximately 5 cm a 22-gauge needle was introduced into this mass was mucopurulent debris which was suctioned free. Cytology was performed at the bedside and contained multiple squamous cells. I used an 18-gauge needle to aspirate all of this fluid out which was too thick to come thru the 22-gauge. Approximately 14 cc of this mucopurulent debris was suctioned free. With the cystic mass deflated  a firm mass could be palpated a 22-gauge was then used to perform a needle aspirate of this mass. This did appear to be metastatic squamous cell disease. At this point Dr. 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 No further surgery to be done the area biopsied was cauterized using silver nitrate all sponges were removed. The patient was return anesthesia where she was awakened in the operating room taken recovery room in stable condition  Specimens left floor mouth and left neck needle aspirate  Cultures: Left neck aspirate  EBL: Less than 10 cc  Dispo:   Good  Plan:  Discharge to home will involve oncology.  Rebecca Mcmillan T  07/02/2015 10:22 AM

## 2015-07-02 NOTE — Transfer of Care (Signed)
Immediate Anesthesia Transfer of Care Note  Patient: Rebecca Mcmillan  Procedure(s) Performed: Procedure(s): TONGUE BIOPSY (N/A) FINE NEEDLE ASPIRATION (N/A)  Patient Location: PACU  Anesthesia Type:General  Level of Consciousness: sedated  Airway & Oxygen Therapy: Patient Spontanous Breathing and Patient connected to face mask oxygen  Post-op Assessment: Report given to RN and Post -op Vital signs reviewed and stable  Post vital signs: Reviewed and stable  Last Vitals:  Filed Vitals:   07/02/15 0835  BP: 156/76  Pulse: 48  Temp: 36.6 C  Resp: 18    Complications: No apparent anesthesia complications

## 2015-07-02 NOTE — Anesthesia Procedure Notes (Signed)
Procedure Name: Intubation Performed by: Shahara Hartsfield Pre-anesthesia Checklist: Patient identified, Patient being monitored, Timeout performed, Emergency Drugs available and Suction available Patient Re-evaluated:Patient Re-evaluated prior to inductionOxygen Delivery Method: Circle system utilized Preoxygenation: Pre-oxygenation with 100% oxygen Intubation Type: IV induction Ventilation: Mask ventilation without difficulty Laryngoscope Size: Mac and 3 Grade View: Grade II Tube type: Oral Tube size: 7.0 mm Number of attempts: 1 Airway Equipment and Method: Stylet Placement Confirmation: ETT inserted through vocal cords under direct vision,  positive ETCO2 and breath sounds checked- equal and bilateral Secured at: 21 cm Tube secured with: Tape Dental Injury: Teeth and Oropharynx as per pre-operative assessment        

## 2015-07-03 LAB — SURGICAL PATHOLOGY

## 2015-07-03 LAB — CYTOLOGY - NON PAP

## 2015-07-06 LAB — WOUND CULTURE: Culture: NO GROWTH

## 2015-07-06 LAB — ANAEROBIC CULTURE

## 2015-07-08 DIAGNOSIS — C040 Malignant neoplasm of anterior floor of mouth: Secondary | ICD-10-CM | POA: Diagnosis not present

## 2015-07-10 ENCOUNTER — Inpatient Hospital Stay (HOSPITAL_BASED_OUTPATIENT_CLINIC_OR_DEPARTMENT_OTHER): Payer: Commercial Managed Care - HMO | Admitting: Internal Medicine

## 2015-07-10 VITALS — BP 165/73 | HR 52 | Temp 97.6°F | Wt 95.5 lb

## 2015-07-10 DIAGNOSIS — E039 Hypothyroidism, unspecified: Secondary | ICD-10-CM | POA: Diagnosis not present

## 2015-07-10 DIAGNOSIS — IMO0002 Reserved for concepts with insufficient information to code with codable children: Secondary | ICD-10-CM

## 2015-07-10 DIAGNOSIS — R221 Localized swelling, mass and lump, neck: Secondary | ICD-10-CM | POA: Diagnosis not present

## 2015-07-10 DIAGNOSIS — R634 Abnormal weight loss: Secondary | ICD-10-CM | POA: Diagnosis not present

## 2015-07-10 DIAGNOSIS — Z79899 Other long term (current) drug therapy: Secondary | ICD-10-CM

## 2015-07-10 DIAGNOSIS — Z95828 Presence of other vascular implants and grafts: Secondary | ICD-10-CM

## 2015-07-10 DIAGNOSIS — R590 Localized enlarged lymph nodes: Secondary | ICD-10-CM

## 2015-07-10 DIAGNOSIS — I10 Essential (primary) hypertension: Secondary | ICD-10-CM | POA: Diagnosis not present

## 2015-07-10 DIAGNOSIS — R630 Anorexia: Secondary | ICD-10-CM | POA: Diagnosis not present

## 2015-07-10 DIAGNOSIS — F418 Other specified anxiety disorders: Secondary | ICD-10-CM | POA: Diagnosis not present

## 2015-07-10 DIAGNOSIS — C049 Malignant neoplasm of floor of mouth, unspecified: Secondary | ICD-10-CM

## 2015-07-10 DIAGNOSIS — C799 Secondary malignant neoplasm of unspecified site: Secondary | ICD-10-CM

## 2015-07-10 MED ORDER — ONDANSETRON HCL 8 MG PO TABS: 8.0000 mg | ORAL_TABLET | Freq: Three times a day (TID) | ORAL | Status: DC | PRN

## 2015-07-10 MED ORDER — PROCHLORPERAZINE MALEATE 10 MG PO TABS
10.0000 mg | ORAL_TABLET | Freq: Four times a day (QID) | ORAL | Status: DC | PRN
Start: 2015-07-10 — End: 2015-07-22

## 2015-07-10 MED ORDER — LIDOCAINE-PRILOCAINE 2.5-2.5 % EX CREA: 1.0000 "application " | TOPICAL_CREAM | CUTANEOUS | Status: AC | PRN

## 2015-07-10 NOTE — Patient Instructions (Signed)
Cisplatin injection What is this medicine? CISPLATIN (SIS pla tin) is a chemotherapy drug. It targets fast dividing cells, like cancer cells, and causes these cells to die. This medicine is used to treat many types of cancer like bladder, ovarian, and testicular cancers. This medicine may be used for other purposes; ask your health care provider or pharmacist if you have questions. What should I tell my health care provider before I take this medicine? They need to know if you have any of these conditions: -blood disorders -hearing problems -kidney disease -recent or ongoing radiation therapy -an unusual or allergic reaction to cisplatin, carboplatin, other chemotherapy, other medicines, foods, dyes, or preservatives -pregnant or trying to get pregnant -breast-feeding How should I use this medicine? This drug is given as an infusion into a vein. It is administered in a hospital or clinic by a specially trained health care professional. Talk to your pediatrician regarding the use of this medicine in children. Special care may be needed. Overdosage: If you think you have taken too much of this medicine contact a poison control center or emergency room at once. NOTE: This medicine is only for you. Do not share this medicine with others. What if I miss a dose? It is important not to miss a dose. Call your doctor or health care professional if you are unable to keep an appointment. What may interact with this medicine? -dofetilide -foscarnet -medicines for seizures -medicines to increase blood counts like filgrastim, pegfilgrastim, sargramostim -probenecid -pyridoxine used with altretamine -rituximab -some antibiotics like amikacin, gentamicin, neomycin, polymyxin B, streptomycin, tobramycin -sulfinpyrazone -vaccines -zalcitabine Talk to your doctor or health care professional before taking any of these medicines: -acetaminophen -aspirin -ibuprofen -ketoprofen -naproxen This list may  not describe all possible interactions. Give your health care provider a list of all the medicines, herbs, non-prescription drugs, or dietary supplements you use. Also tell them if you smoke, drink alcohol, or use illegal drugs. Some items may interact with your medicine. What should I watch for while using this medicine? Your condition will be monitored carefully while you are receiving this medicine. You will need important blood work done while you are taking this medicine. This drug may make you feel generally unwell. This is not uncommon, as chemotherapy can affect healthy cells as well as cancer cells. Report any side effects. Continue your course of treatment even though you feel ill unless your doctor tells you to stop. In some cases, you may be given additional medicines to help with side effects. Follow all directions for their use. Call your doctor or health care professional for advice if you get a fever, chills or sore throat, or other symptoms of a cold or flu. Do not treat yourself. This drug decreases your body's ability to fight infections. Try to avoid being around people who are sick. This medicine may increase your risk to bruise or bleed. Call your doctor or health care professional if you notice any unusual bleeding. Be careful brushing and flossing your teeth or using a toothpick because you may get an infection or bleed more easily. If you have any dental work done, tell your dentist you are receiving this medicine. Avoid taking products that contain aspirin, acetaminophen, ibuprofen, naproxen, or ketoprofen unless instructed by your doctor. These medicines may hide a fever. Do not become pregnant while taking this medicine. Women should inform their doctor if they wish to become pregnant or think they might be pregnant. There is a potential for serious side effects to   an unborn child. Talk to your health care professional or pharmacist for more information. Do not breast-feed an  infant while taking this medicine. Drink fluids as directed while you are taking this medicine. This will help protect your kidneys. Call your doctor or health care professional if you get diarrhea. Do not treat yourself. What side effects may I notice from receiving this medicine? Side effects that you should report to your doctor or health care professional as soon as possible: -allergic reactions like skin rash, itching or hives, swelling of the face, lips, or tongue -signs of infection - fever or chills, cough, sore throat, pain or difficulty passing urine -signs of decreased platelets or bleeding - bruising, pinpoint red spots on the skin, black, tarry stools, nosebleeds -signs of decreased red blood cells - unusually weak or tired, fainting spells, lightheadedness -breathing problems -changes in hearing -gout pain -low blood counts - This drug may decrease the number of white blood cells, red blood cells and platelets. You may be at increased risk for infections and bleeding. -nausea and vomiting -pain, swelling, redness or irritation at the injection site -pain, tingling, numbness in the hands or feet -problems with balance, movement -trouble passing urine or change in the amount of urine Side effects that usually do not require medical attention (report to your doctor or health care professional if they continue or are bothersome): -changes in vision -loss of appetite -metallic taste in the mouth or changes in taste This list may not describe all possible side effects. Call your doctor for medical advice about side effects. You may report side effects to FDA at 1-800-FDA-1088. Where should I keep my medicine? This drug is given in a hospital or clinic and will not be stored at home. NOTE: This sheet is a summary. It may not cover all possible information. If you have questions about this medicine, talk to your doctor, pharmacist, or health care provider.    2016, [employer]vier/[employer]  Standard. (2007-06-07 14:40:54)  

## 2015-07-10 NOTE — Progress Notes (Signed)
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  She has been started on antibiotics for possible infection.  Patient continues to lose weight. She states to have lost about 18 pounds in the last 3 months. Left neck lump has not significantly gotten worse since her last visit approximately 3 weeks ago.  Otherwise no nausea no vomiting. She denies any pain. Denies any difficulty swallowing pain with swallowing or any hoarseness of voice. Denies any shortness of breath or chest pain.   ROS: A complete 10 point review of system is done which is negative except mentioned above in history of present illness  MEDICAL HISTORY:  Past Medical History  Diagnosis Date  . Hypertension   . Thyroid disease   . Hypothyroidism   . Hyperlipidemia     SURGICAL HISTORY: None  SOCIAL HISTORY: Patient previously used to work in nursing homes. Denies any alcohol. Smokes a pack of Celexa day for 45 years lives with her husband in [place]. No children.  Social History   Social History  . Marital Status: Married    Spouse Name: N/A  . Number of Children: N/A  . Years of Education: N/A   Occupational History  . Not on file.   Social History Main Topics  . Smoking status: Former Smoker -- 1.00 packs/day for 45 years    Types: Cigarettes    Quit  date: 06/19/2015  . Smokeless tobacco: Never Used  . Alcohol Use: No  . Drug Use: No  . Sexual Activity: Not on file   Other Topics Concern  . Not on file   Social History Narrative    FAMILY HISTORY:no family history of head and neck cancer.   ALLERGIES:  is allergic to garlic; lactose intolerance (gi); and tetracyclines & related.  MEDICATIONS:  Current Outpatient Prescriptions  Medication Sig Dispense Refill  . aspirin 325 MG tablet Take 325 mg by mouth daily.    Marland Kitchen atenolol (TENORMIN) 25 MG tablet Take 25 mg by mouth daily.     . clindamycin (CLEOCIN) 300 MG capsule     . levothyroxine (SYNTHROID, LEVOTHROID) 50 MCG tablet     . losartan (COZAAR) 100 MG tablet     . losartan-hydrochlorothiazide (HYZAAR) 50-12.5 MG tablet Take 1 tablet by mouth daily.     Marland Kitchen lovastatin (MEVACOR) 40 MG tablet Take 40 mg by mouth every evening.     Marland Kitchen oxyCODONE-acetaminophen (ROXICET) 5-325 MG tablet Take 1-2 tablets by mouth every 4 (four) hours as needed for severe pain. 40 tablet 0   No current facility-administered medications for this visit.      Marland Kitchen  PHYSICAL EXAMINATION: ECOG PERFORMANCE STATUS: 1 - Symptomatic but completely ambulatory  Filed Vitals:   07/10/15 1533  BP: 165/73  Pulse: 52  Temp: 97.6 F (36.4 C)  Filed Weights   07/10/15 1529  Weight: 95 lb 7.4 oz (43.3 kg)    GENERAL: Thin built moderately nourished Caucasian female patient anxious. Alert, no distress and comfortable. She is Accompanied by her husband.  EYES: no pallor or icterus OROPHARYNX: no thrush or ulceration; poor dentition. No obvious masses noted in the oral cavity. NECK: supple, 4-5 cm mass/firm- submandibular mass. Non-tender.  LYMPH:  no palpable lymphadenopathy xillary or inguinal regions LUNGS: Decreased breath sounds bilaterally No wheeze or crackles; barrel chested.  HEART/CVS: regular rate & rhythm and no murmurs; No lower extremity edema ABDOMEN: abdomen soft, non-tender and normal bowel  sounds Musculoskeletal:no cyanosis of digits and no clubbing  PSYCH: alert & oriented x 3 with fluent speech NEURO: no focal motor/sensory deficits SKIN:  no rashes or significant lesions  LABORATORY DATA:  I have reviewed the data as listed Lab Results  Component Value Date   WBC 11.9* 06/21/2015   HGB 13.4 06/21/2015   HCT 37.5 06/21/2015   MCV 90.1 06/21/2015   PLT 313 06/21/2015    Recent Labs  06/21/15 1029 06/28/15 1139  NA 126* 134*  K 3.9 3.8  CL 91* 103  CO2 26 24  GLUCOSE 108* 93  BUN 16 13  CREATININE 0.72 0.74  CALCIUM 9.0 8.8*  GFRNONAA >60 >60  GFRAA >60 >60  PROT 8.1  --   ALBUMIN 4.9  --   AST 22  --   ALT 17  --   ALKPHOS 74  --   BILITOT 0.8  --     RADIOGRAPHIC STUDIES: I have personally reviewed the radiological images as listed and agreed with the findings in the report. Nm Pet Image Initial (pi) Skull Base To Thigh  06/26/2015  CLINICAL DATA:  Initial treatment strategy for left-sided tongue cancer. No recent chemotherapy or radiation therapy. EXAM: NUCLEAR MEDICINE PET SKULL BASE TO THIGH TECHNIQUE: 12.2 mCi F-18 FDG was injected intravenously. Full-ring PET imaging was performed from the skull base to thigh after the radiotracer. CT data was obtained and used for attenuation correction and anatomic localization. FASTING BLOOD GLUCOSE:  Value: 89 mg/dl COMPARISON:  Neck CT of 06/13/2015. Abdominopelvic CT of 12/29/2006. FINDINGS: NECK Left floor of mouth primary. This measures a S.U.V. max of 9.9, including on image 34/series 3. An adjacent left submandibular mass is favored to represent localize nodal metastasis, possibly with a component of direct tumor extension. This measures 4.3 x 3.3 cm and a S.U.V. max of 12.1. The hypermetabolism is identified medially, with necrosis identified laterally. Hypermetabolic nodule or node in the left parotid gland measures 7 mm and a S.U.V. max of 3.9 on image 30/series 3. Left level 2 hypermetabolic nodes,  including a centrally necrotic 1.0 cm node which measures a S.U.V. max of 4.3 on image 35/series 3. CHEST No areas of abnormal hypermetabolism. ABDOMEN/PELVIS No areas of abnormal hypermetabolism. SKELETON No abnormal marrow activity. CT IMAGES PERFORMED FOR ATTENUATION CORRECTION Neck findings deferred to recent diagnostic CT. Thoracic aortic and great vessel atherosclerosis. Multivessel coronary artery atherosclerosis. Mild centrilobular and paraseptal emphysema. Normal adrenal glands. Renal vascular calcifications. Non aneurysmal dilatation of the infrarenal abdominal aorta at maximally 2.2 cm. Presumed calcified wall thrombus within. Hysterectomy. Mild pelvic floor laxity. Osteopenia. IMPRESSION: 1. Left-sided floor of mouth primary with left-sided submandibular and level 2 jugular chain nodal metastasis. A left parotid hypermetabolic nodule is favored to represent metastasis. Concurrent parotid neoplasm felt less likely. 2. No extracervical hypermetabolic metastatic disease identified. 3. Advanced atherosclerosis, including within  the coronary arteries. Electronically Signed   By: Abigail Miyamoto M.D.   On: 06/26/2015 10:42   Ct Maxillofacial W/cm  06/13/2015  CLINICAL DATA:  70 year old female with mass left mandible since February. Initial encounter. EXAM: CT MAXILLOFACIAL WITH CONTRAST TECHNIQUE: Multidetector CT imaging of the maxillofacial structures was performed with intravenous contrast. Multiplanar CT image reconstructions were also generated. A small metallic BB was placed on the right temple in order to reliably differentiate right from left. CONTRAST:  47mL ISOVUE-300 IOPAMIDOL (ISOVUE-300) INJECTION 61% COMPARISON:  None. FINDINGS: Primary malignancy within the left tongue/ floor of mouth suspected spanning over 4 x 3 x 2 cm. 4.3 x 3.3 x 3 cm complex mass extends through the left mylohyoid muscle below the left mandible anterior to left submandibular gland suspicious for necrotic metastatic  adenopathy. Extracapsular spread may be present. At points is is inseparable from the submandibular gland. Small metastatic lymph nodes bilaterally sublingual level 1 region larger on the left measuring up to 1.2 cm. Metastatic necrotic adenopathy left level 2 and 3 region largest in the left level 2A region measuring up to 1.4 cm. Infectious process felt to be unlikely as cause for above described findings. Atherosclerotic changes with carotid bifurcation and internal carotid artery calcification narrowing greater on the left. Evaluation limited by streak artifact however left internal carotid artery stenosis may be hemodynamically significant. 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 IMPRESSION: Primary malignancy within the left tongue/ floor of mouth suspected spanning over 4 x 3 x 2 cm. 4.3 x 3.3 x 3 cm complex mass extends through the left mylohyoid muscle below the left mandible anterior to left submandibular gland suspicious for necrotic metastatic adenopathy. Extracapsular spread may be present. At points is is inseparable from the submandibular gland. Small metastatic lymph nodes bilaterally sublingual level 1 region larger on the left measuring up to 1.2 cm. Metastatic necrotic adenopathy left level 2 and 3 region largest in the left level 2A region measuring up to 1.4 cm. Atherosclerotic changes with carotid bifurcation and internal carotid artery calcification narrowing greater on the left. Evaluation limited by streak artifact however left internal carotid artery stenosis may be hemodynamically significant. These results will be called to the ordering clinician or representative by the Radiologist Assistant, and communication documented in the PACS or zVision Dashboard. Electronically Signed   By: Genia Del M.D.   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 Unresectable as per ENT. Discussed the Combined chemoradiation therapy modality the patient/husband in detail. Discussed that the goal of care is local control. Cure less likley.   # I would recommend cisplatin weekly along with radiation.  Discussed the potential side effects including but not limited to-increasing fatigue, nausea vomiting, diarrhea, hair loss, sores in the mouth, increase risk of infection and also neuropathy. Recommend chemotherapy education. Discussed regarding port placement.  Referred to Dr. Donella Stade for radiation. Discussed with dr.Crystal   # Also discussed regarding PEG tube given the weight loss- patient reluctant. Hold off For now.  # Discussed with Dr. Tami Ribas will also discuss the tumor conference tomorrow.  # Patient is planned to go to [place] for her mother's funeral. She will see me back on May 15 to proceed with cisplatin.  # 40 minutes face-to-face with the patient discussing the above plan of care; more  than 50% of time spent on prognosis/ natural history; counseling and coordination.   Cammie Sickle, MD 07/10/2015 3:41 PM  Addendum: Discussed at tumor conference. Plan as above.

## 2015-07-10 NOTE — Progress Notes (Signed)
Pt provided with scripts for emla cream, zofran and compazine. Pt refused to have these medications called into local pharmacy or to provide me with a local pharmacy of her choice. She asked that these rx be sent to her mail order pharmacy only.  RX sent to [facility], [address] [facility] [address] at fax [phone].  Dr. Linton Ham office was notified of the ref to Dr. Massie Maroon for radiation therapy and the need for prior auth for a port a cath placement with Dr. Lucky Cowboy or Delana Meyer. Pt is an established patient with Dr. Lucky Cowboy.  Pt provided with cisplatin education (written and verbal). Also provided with written information on a port a cath placement. Teach back process performed with patient. Pt states that she feels very reluctant to schedule her port placement. I want to go to [place] to bury my mother and have her memorial service. I am scheduled to leave next Saturday May 6 and will not be back until May 13'th. I explained to patient that Dr. Rogue Bussing would like to start her first treatment on May 15th. He would like the port a cath placed by this date; however, I explained that it is the patient's choice whether to proceed without a port for the first treatment. I explained the pros and cons of having a port placement.  Approximately 20 minutes spent in face to face discussion with patient. Her husband was at the bedside during the discussion. Patient agreed to allow me to make the referral to Dr. Lucky Cowboy. I reviewed with the patient that on the day of the port placement, she will not be able to have anything to eat or drink after midnight in preparation for the procedure. She will need to bring a driver to this procedure.  I faxed the referral to vascular. I will contact the patient as soon as I know when the port will be placed.

## 2015-07-10 NOTE — Patient Instructions (Addendum)
Cisplatin injection What is this medicine? CISPLATIN (SIS pla tin) is a chemotherapy drug. It targets fast dividing cells, like cancer cells, and causes these cells to die. This medicine is used to treat many types of cancer like bladder, ovarian, and testicular cancers. This medicine may be used for other purposes; ask your health care provider or pharmacist if you have questions. What should I tell my health care provider before I take this medicine? They need to know if you have any of these conditions: -blood disorders -hearing problems -kidney disease -recent or ongoing radiation therapy -an unusual or allergic reaction to cisplatin, carboplatin, other chemotherapy, other medicines, foods, dyes, or preservatives -pregnant or trying to get pregnant -breast-feeding How should I use this medicine? This drug is given as an infusion into a vein. It is administered in a hospital or clinic by a specially trained health care professional. Talk to your pediatrician regarding the use of this medicine in children. Special care may be needed. Overdosage: If you think you have taken too much of this medicine contact a poison control center or emergency room at once. NOTE: This medicine is only for you. Do not share this medicine with others. What if I miss a dose? It is important not to miss a dose. Call your doctor or health care professional if you are unable to keep an appointment. What may interact with this medicine? -dofetilide -foscarnet -medicines for seizures -medicines to increase blood counts like filgrastim, pegfilgrastim, sargramostim -probenecid -pyridoxine used with altretamine -rituximab -some antibiotics like amikacin, gentamicin, neomycin, polymyxin B, streptomycin, tobramycin -sulfinpyrazone -vaccines -zalcitabine Talk to your doctor or health care professional before taking any of these medicines: -acetaminophen -aspirin -ibuprofen -ketoprofen -naproxen This list may  not describe all possible interactions. Give your health care provider a list of all the medicines, herbs, non-prescription drugs, or dietary supplements you use. Also tell them if you smoke, drink alcohol, or use illegal drugs. Some items may interact with your medicine. What should I watch for while using this medicine? Your condition will be monitored carefully while you are receiving this medicine. You will need important blood work done while you are taking this medicine. This drug may make you feel generally unwell. This is not uncommon, as chemotherapy can affect healthy cells as well as cancer cells. Report any side effects. Continue your course of treatment even though you feel ill unless your doctor tells you to stop. In some cases, you may be given additional medicines to help with side effects. Follow all directions for their use. Call your doctor or health care professional for advice if you get a fever, chills or sore throat, or other symptoms of a cold or flu. Do not treat yourself. This drug decreases your body's ability to fight infections. Try to avoid being around people who are sick. This medicine may increase your risk to bruise or bleed. Call your doctor or health care professional if you notice any unusual bleeding. Be careful brushing and flossing your teeth or using a toothpick because you may get an infection or bleed more easily. If you have any dental work done, tell your dentist you are receiving this medicine. Avoid taking products that contain aspirin, acetaminophen, ibuprofen, naproxen, or ketoprofen unless instructed by your doctor. These medicines may hide a fever. Do not become pregnant while taking this medicine. Women should inform their doctor if they wish to become pregnant or think they might be pregnant. There is a potential for serious side effects to   an unborn child. Talk to your health care professional or pharmacist for more information. Do not breast-feed an  infant while taking this medicine. Drink fluids as directed while you are taking this medicine. This will help protect your kidneys. Call your doctor or health care professional if you get diarrhea. Do not treat yourself. What side effects may I notice from receiving this medicine? Side effects that you should report to your doctor or health care professional as soon as possible: -allergic reactions like skin rash, itching or hives, swelling of the face, lips, or tongue -signs of infection - fever or chills, cough, sore throat, pain or difficulty passing urine -signs of decreased platelets or bleeding - bruising, pinpoint red spots on the skin, black, tarry stools, nosebleeds -signs of decreased red blood cells - unusually weak or tired, fainting spells, lightheadedness -breathing problems -changes in hearing -gout pain -low blood counts - This drug may decrease the number of white blood cells, red blood cells and platelets. You may be at increased risk for infections and bleeding. -nausea and vomiting -pain, swelling, redness or irritation at the injection site -pain, tingling, numbness in the hands or feet -problems with balance, movement -trouble passing urine or change in the amount of urine Side effects that usually do not require medical attention (report to your doctor or health care professional if they continue or are bothersome): -changes in vision -loss of appetite -metallic taste in the mouth or changes in taste This list may not describe all possible side effects. Call your doctor for medical advice about side effects. You may report side effects to FDA at 1-800-FDA-1088. Where should I keep my medicine? This drug is given in a hospital or clinic and will not be stored at home. NOTE: This sheet is a summary. It may not cover all possible information. If you have questions about this medicine, talk to your doctor, pharmacist, or health care provider.    2016, Elsevier/Gold  Standard. (2007-06-07 14:40:54)     Implanted [facility] Guide An implanted port is a type of central line that is placed under the skin. Central lines are used to provide IV access when treatment or nutrition needs to be given through a person's veins. Implanted ports are used for long-term IV access. An implanted port may be placed because:   You need IV medicine that would be irritating to the small veins in your hands or arms.   You need long-term IV medicines, such as antibiotics.   You need IV nutrition for a long period.   You need frequent blood draws for lab tests.   You need dialysis.  Implanted ports are usually placed in the chest area, but they can also be placed in the upper arm, the abdomen, or the leg. An implanted port has two main parts:   Reservoir. The reservoir is round and will appear as a small, raised area under your skin. The reservoir is the part where a needle is inserted to give medicines or draw blood.   Catheter. The catheter is a thin, flexible tube that extends from the reservoir. The catheter is placed into a large vein. Medicine that is inserted into the reservoir goes into the catheter and then into the vein.  HOW WILL I CARE FOR MY INCISION SITE? Do not get the incision site wet. Bathe or shower as directed by your health care provider.  HOW IS MY PORT ACCESSED? Special steps must be taken to access the port:   Before  the port is accessed, a numbing cream can be placed on the skin. This helps numb the skin over the port site.   Your health care provider uses a sterile technique to access the port.  Your health care provider must put on a mask and sterile gloves.  The skin over your port is cleaned carefully with an antiseptic and allowed to dry.  The port is gently pinched between sterile gloves, and a needle is inserted into the port.  Only "non-coring" port needles should be used to access the port. Once the port is accessed, a blood  return should be checked. This helps ensure that the port is in the vein and is not clogged.   If your port needs to remain accessed for a constant infusion, a clear (transparent) bandage will be placed over the needle site. The bandage and needle will need to be changed every week, or as directed by your health care provider.   Keep the bandage covering the needle clean and dry. Do not get it wet. Follow your health care provider's instructions on how to take a shower or bath while the port is accessed.   If your port does not need to stay accessed, no bandage is needed over the port.  WHAT IS FLUSHING? Flushing helps keep the port from getting clogged. Follow your health care provider's instructions on how and when to flush the port. Ports are usually flushed with saline solution or a medicine called heparin. The need for flushing will depend on how the port is used.   If the port is used for intermittent medicines or blood draws, the port will need to be flushed:   After medicines have been given.   After blood has been drawn.   As part of routine maintenance.   If a constant infusion is running, the port may not need to be flushed.  HOW LONG WILL MY PORT STAY IMPLANTED? The port can stay in for as long as your health care provider thinks it is needed. When it is time for the port to come out, surgery will be done to remove it. The procedure is similar to the one performed when the port was put in.  WHEN SHOULD I SEEK IMMEDIATE MEDICAL CARE? When you have an implanted port, you should seek immediate medical care if:   You notice a bad smell coming from the incision site.   You have swelling, redness, or drainage at the incision site.   You have more swelling or pain at the port site or the surrounding area.   You have a fever that is not controlled with medicine.   This information is not intended to replace advice given to you by your health care provider. Make sure  you discuss any questions you have with your health care provider.   Document Released: 03/02/2005 Document Revised: 12/21/2012 Document Reviewed: 11/07/2012 Elsevier Interactive Patient Education [employer].

## 2015-07-11 ENCOUNTER — Other Ambulatory Visit: Payer: Self-pay | Admitting: Vascular Surgery

## 2015-07-11 ENCOUNTER — Encounter: Payer: Self-pay | Admitting: *Deleted

## 2015-07-11 NOTE — Progress Notes (Signed)
Received msg from [facility]. Port a cath will be placed on Monday, May 1st. Patient is aware of apts per [facility].

## 2015-07-15 ENCOUNTER — Encounter
Admission: RE | Disposition: A | Discharge status: Home / Self Care | Payer: Self-pay | Source: Ambulatory Visit | Attending: Vascular Surgery

## 2015-07-15 ENCOUNTER — Ambulatory Visit
Admission: RE | Admit: 2015-07-15 | Discharge: 2015-07-15 | Disposition: A | Discharge status: Home / Self Care | Payer: Commercial Managed Care - HMO | Source: Ambulatory Visit | Attending: Vascular Surgery | Admitting: Vascular Surgery

## 2015-07-15 DIAGNOSIS — I70219 Atherosclerosis of native arteries of extremities with intermittent claudication, unspecified extremity: Secondary | ICD-10-CM | POA: Diagnosis not present

## 2015-07-15 DIAGNOSIS — Z681 Body mass index (BMI) 19 or less, adult: Secondary | ICD-10-CM | POA: Diagnosis not present

## 2015-07-15 DIAGNOSIS — E785 Hyperlipidemia, unspecified: Secondary | ICD-10-CM | POA: Insufficient documentation

## 2015-07-15 DIAGNOSIS — Z87891 Personal history of nicotine dependence: Secondary | ICD-10-CM | POA: Insufficient documentation

## 2015-07-15 DIAGNOSIS — Z91018 Allergy to other foods: Secondary | ICD-10-CM | POA: Diagnosis not present

## 2015-07-15 DIAGNOSIS — R630 Anorexia: Secondary | ICD-10-CM | POA: Diagnosis not present

## 2015-07-15 DIAGNOSIS — I739 Peripheral vascular disease, unspecified: Secondary | ICD-10-CM | POA: Diagnosis not present

## 2015-07-15 DIAGNOSIS — I10 Essential (primary) hypertension: Secondary | ICD-10-CM | POA: Diagnosis not present

## 2015-07-15 DIAGNOSIS — Z79899 Other long term (current) drug therapy: Secondary | ICD-10-CM | POA: Diagnosis not present

## 2015-07-15 DIAGNOSIS — I714 Abdominal aortic aneurysm, without rupture: Secondary | ICD-10-CM | POA: Diagnosis not present

## 2015-07-15 DIAGNOSIS — Z888 Allergy status to other drugs, medicaments and biological substances status: Secondary | ICD-10-CM | POA: Diagnosis not present

## 2015-07-15 DIAGNOSIS — E039 Hypothyroidism, unspecified: Secondary | ICD-10-CM | POA: Insufficient documentation

## 2015-07-15 DIAGNOSIS — C801 Malignant (primary) neoplasm, unspecified: Secondary | ICD-10-CM | POA: Insufficient documentation

## 2015-07-15 DIAGNOSIS — Z91011 Allergy to milk products: Secondary | ICD-10-CM | POA: Insufficient documentation

## 2015-07-15 DIAGNOSIS — Z7982 Long term (current) use of aspirin: Secondary | ICD-10-CM | POA: Diagnosis not present

## 2015-07-15 DIAGNOSIS — C799 Secondary malignant neoplasm of unspecified site: Secondary | ICD-10-CM | POA: Diagnosis not present

## 2015-07-15 HISTORY — PX: PERIPHERAL VASCULAR CATHETERIZATION: SHX172C

## 2015-07-15 SURGERY — PORTA CATH INSERTION
Anesthesia: Moderate Sedation

## 2015-07-15 MED ORDER — ONDANSETRON HCL 4 MG/2ML IJ SOLN: 4.0000 mg | Freq: Four times a day (QID) | INTRAMUSCULAR | Status: DC | PRN

## 2015-07-15 MED ORDER — SODIUM CHLORIDE 0.9 % IR SOLN
Freq: Once | Status: DC
  Filled 2015-07-15: qty 2

## 2015-07-15 MED ORDER — LIDOCAINE-EPINEPHRINE (PF) 1 %-1:200000 IJ SOLN
INTRAMUSCULAR | Status: AC
  Filled 2015-07-15: qty 30

## 2015-07-15 MED ORDER — FENTANYL CITRATE (PF) 100 MCG/2ML IJ SOLN
INTRAMUSCULAR | Status: AC
  Filled 2015-07-15: qty 4

## 2015-07-15 MED ORDER — DEXTROSE 5 % IV SOLN: 1.5000 g | INTRAVENOUS | Status: DC

## 2015-07-15 MED ORDER — MIDAZOLAM HCL 2 MG/2ML IJ SOLN
INTRAMUSCULAR | Status: DC | PRN
  Administered 2015-07-15 (×2): 1 mg via INTRAVENOUS
  Administered 2015-07-15: 2 mg via INTRAVENOUS

## 2015-07-15 MED ORDER — HYDROMORPHONE HCL 1 MG/ML IJ SOLN: 1.0000 mg | Freq: Once | INTRAMUSCULAR | Status: DC

## 2015-07-15 MED ORDER — MIDAZOLAM HCL 5 MG/5ML IJ SOLN
INTRAMUSCULAR | Status: AC
  Filled 2015-07-15: qty 5

## 2015-07-15 MED ORDER — SODIUM CHLORIDE 0.9 % IV SOLN: INTRAVENOUS | Status: DC

## 2015-07-15 MED ORDER — FENTANYL CITRATE (PF) 100 MCG/2ML IJ SOLN
INTRAMUSCULAR | Status: DC | PRN
  Administered 2015-07-15 (×2): 25 ug via INTRAVENOUS
  Administered 2015-07-15 (×2): 50 ug via INTRAVENOUS
  Administered 2015-07-15: 25 ug via INTRAVENOUS

## 2015-07-15 SURGICAL SUPPLY — 10 items
BAG DECANTER STRL (MISCELLANEOUS) ×3 IMPLANT
PACK ANGIOGRAPHY (CUSTOM PROCEDURE TRAY) ×3 IMPLANT
PAD GROUND ADULT SPLIT (MISCELLANEOUS) ×3 IMPLANT
PENCIL ELECTRO HAND CTR (MISCELLANEOUS) ×3 IMPLANT
PORTACATH POWER 8F (Port) ×3 IMPLANT
PREP CHG 10.5 TEAL (MISCELLANEOUS) ×3 IMPLANT
SUT MNCRL AB 4-0 PS2 18 (SUTURE) ×3 IMPLANT
SUT PROLENE 0 CT 1 30 (SUTURE) ×3 IMPLANT
SUTURE VIC 3-0 (SUTURE) ×3 IMPLANT
TOWEL OR 17X26 4PK STRL BLUE (TOWEL DISPOSABLE) ×3 IMPLANT

## 2015-07-15 NOTE — H&P (Signed)
  [facility] History & Physical Update  The patient was interviewed and re-examined.  The patient's previous History and Physical has been reviewed and is unchanged.  There is no change in the plan of care. We plan to proceed with the scheduled procedure.  DEW,JASON, MD  07/15/2015, 12:42 PM

## 2015-07-15 NOTE — Discharge Instructions (Signed)
Implanted Port Insertion, Care After °Refer to this sheet in the next few weeks. These instructions provide you with information on caring for yourself after your procedure. Your health care provider may also give you more specific instructions. Your treatment has been planned according to current medical practices, but problems sometimes occur. Call your health care provider if you have any problems or questions after your procedure. °WHAT TO EXPECT AFTER THE PROCEDURE °After your procedure, it is typical to have the following:  °· Discomfort at the port insertion site. Ice packs to the area will help. °· Bruising on the skin over the port. This will subside in 3-4 days. °HOME CARE INSTRUCTIONS °· After your port is placed, you will get a manufacturer's information card. The card has information about your port. Keep this card with you at all times.   °· Know what kind of port you have. There are many types of ports available.   °· Wear a medical alert bracelet in case of an emergency. This can help alert health care workers that you have a port.   °· The port can stay in for as long as your health care provider believes it is necessary.   °· A home health care nurse may give medicines and take care of the port.   °· You or a family member can get special training and directions for giving medicine and taking care of the port at home.   °SEEK MEDICAL CARE IF:  °· Your port does not flush or you are unable to get a blood return.   °· You have a fever or chills. °SEEK IMMEDIATE MEDICAL CARE IF: °· You have new fluid or pus coming from your incision.   °· You notice a bad smell coming from your incision site.   °· You have swelling, pain, or more redness at the incision or port site.   °· You have chest pain or shortness of breath. °  °This information is not intended to replace advice given to you by your health care provider. Make sure you discuss any questions you have with your health care provider. °  °Document  Released: 12/21/2012 Document Revised: 03/07/2013 Document Reviewed: 12/21/2012 °Elsevier Interactive Patient Education ©2016 Elsevier Inc. ° °

## 2015-07-15 NOTE — Op Note (Signed)
      [facility]       Operative Note  Date: 07/15/2015  Preoperative diagnosis:  1. 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 Ultrasound guidance for vascular access to the right internal jugular vein. #2. Fluoroscopic guidance for placement of catheter. #3. Placement of CT compatible Port-A-Cath, right internal jugular vein.  Surgeon: Leotis Pain, MD.   Anesthesia: Local with moderate conscious sedation for approximately 25  minutes using 4 mg of Versed and 150 mcg of Fentanyl  Fluoroscopy time: less than 1 minute  Contrast used: 0  Estimated blood loss: Minimal  Indication for the procedure:  The patient is a 70 y.o.female with head and neck cancer.  The patient needs a Port-A-Cath for durable venous access, chemotherapy, lab draws, and CT scans. We are asked to place this. Risks and benefits were discussed and informed consent was obtained.  Description of procedure: The patient was brought to the vascular and interventional radiology suite.  Moderate conscious sedation was administered throughout the procedure with a face-to-face encounter with the patient with my supervision of the RN administering medicines and monitoring the patient's vital signs, pulse oximetry, telemetry and mental status throughout from the start of the procedure until the patient was taken to the recovery room. The right neck chest and shoulder were sterilely prepped and draped, and a sterile surgical field was created. Ultrasound was used to help visualize a patent right internal jugular vein. This was then accessed under direct ultrasound guidance without difficulty with the Seldinger needle and a permanent image was recorded. A J-wire was placed. After skin nick and dilatation, the peel-away sheath was then placed over the wire. I then anesthetized an area under the clavicle approximately 1-2 fingerbreadths. A transverse incision was  created and an inferior pocket was created with electrocautery and blunt dissection. The port was then brought onto the field, placed into the pocket and secured to the chest wall with 2 Prolene sutures. The catheter was connected to the port and tunneled from the subclavicular incision to the access site. Fluoroscopic guidance was then used to cut the catheter to an appropriate length. The catheter was then placed through the peel-away sheath and the peel-away sheath was removed. The catheter tip was parked in excellent location under fluorocoscopic guidance in the cavoatrial junction area. The pocket was then irrigated with antibiotic impregnated saline and the wound was closed with a running 3-0 Vicryl and a 4-0 Monocryl. The access incision was closed with a single 4-0 Monocryl. The Huber needle was used to withdraw blood and flush the port with heparinized saline. Dermabond was then placed as a dressing. The patient tolerated the procedure well and was taken to the recovery room in stable condition.   DEW,JASON 07/15/2015 2:20 PM

## 2015-07-16 ENCOUNTER — Ambulatory Visit
Admission: RE | Admit: 2015-07-16 | Discharge: 2015-07-16 | Disposition: A | Discharge status: Home / Self Care | Payer: Commercial Managed Care - HMO | Source: Ambulatory Visit | Attending: Radiation Oncology | Admitting: Radiation Oncology

## 2015-07-16 ENCOUNTER — Inpatient Hospital Stay: Payer: Commercial Managed Care - HMO | Attending: Internal Medicine

## 2015-07-16 ENCOUNTER — Encounter: Payer: Self-pay | Admitting: Radiation Oncology

## 2015-07-16 VITALS — BP 167/88 | HR 74 | Temp 96.8°F | Resp 20 | Wt 97.0 lb

## 2015-07-16 DIAGNOSIS — Z7982 Long term (current) use of aspirin: Secondary | ICD-10-CM | POA: Insufficient documentation

## 2015-07-16 DIAGNOSIS — C040 Malignant neoplasm of anterior floor of mouth: Secondary | ICD-10-CM | POA: Insufficient documentation

## 2015-07-16 DIAGNOSIS — W19XXXA Unspecified fall, initial encounter: Secondary | ICD-10-CM | POA: Insufficient documentation

## 2015-07-16 DIAGNOSIS — C770 Secondary and unspecified malignant neoplasm of lymph nodes of head, face and neck: Secondary | ICD-10-CM | POA: Insufficient documentation

## 2015-07-16 DIAGNOSIS — F17210 Nicotine dependence, cigarettes, uncomplicated: Secondary | ICD-10-CM | POA: Insufficient documentation

## 2015-07-16 DIAGNOSIS — D649 Anemia, unspecified: Secondary | ICD-10-CM | POA: Insufficient documentation

## 2015-07-16 DIAGNOSIS — Z79899 Other long term (current) drug therapy: Secondary | ICD-10-CM | POA: Insufficient documentation

## 2015-07-16 DIAGNOSIS — E039 Hypothyroidism, unspecified: Secondary | ICD-10-CM | POA: Insufficient documentation

## 2015-07-16 DIAGNOSIS — R252 Cramp and spasm: Secondary | ICD-10-CM | POA: Insufficient documentation

## 2015-07-16 DIAGNOSIS — C01 Malignant neoplasm of base of tongue: Secondary | ICD-10-CM | POA: Insufficient documentation

## 2015-07-16 DIAGNOSIS — E785 Hyperlipidemia, unspecified: Secondary | ICD-10-CM | POA: Insufficient documentation

## 2015-07-16 DIAGNOSIS — Z5111 Encounter for antineoplastic chemotherapy: Secondary | ICD-10-CM | POA: Insufficient documentation

## 2015-07-16 DIAGNOSIS — I10 Essential (primary) hypertension: Secondary | ICD-10-CM | POA: Insufficient documentation

## 2015-07-16 DIAGNOSIS — R200 Anesthesia of skin: Secondary | ICD-10-CM | POA: Insufficient documentation

## 2015-07-16 DIAGNOSIS — C049 Malignant neoplasm of floor of mouth, unspecified: Secondary | ICD-10-CM | POA: Diagnosis not present

## 2015-07-16 DIAGNOSIS — Z510 Encounter for antineoplastic radiation therapy: Secondary | ICD-10-CM | POA: Insufficient documentation

## 2015-07-16 DIAGNOSIS — R634 Abnormal weight loss: Secondary | ICD-10-CM | POA: Insufficient documentation

## 2015-07-16 NOTE — Consult Note (Signed)
Except an outstanding is perfect of Radiation Oncology NEW PATIENT EVALUATION  Name: Rebecca Mcmillan  MRN: [id number]  Date:   07/16/2015     DOB: March 16, 1946   This 70 y.o. female patient presents to the clinic for initial evaluation of base of tongue cancer stage for a (T4 N2 M0) for concurrent chemoradiation.  REFERRING PHYSICIAN: Tracie Harrier, MD  CHIEF COMPLAINT:  Chief Complaint  Patient presents with  . Cancer    Pt is here for initial consultation of floor of mouth cancer.      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   PREVIOUS INVESTIGATIONS:  PET CT and CT scans reviewed Clinical notes reviewed Pathology report reviewed Presented at case conference tumor Board  HPI: Patient is an 70 year old female who presented with approximately 20 pound weight loss over the past months and a protruding left neck mass in his sub-digastric region concerning for malignancy. She was seen by ENT found to have a 10 cm mass in the left floor of the mouth. She was seen by ENT taken to the operating room where a firm mass involving the left floor the mouth extended to the lateral tongue gutter anterior to the floor the mouth with involvement of the tongue past the midline with anterior fixation. Biopsy was positive for squamous cell carcinoma. PET CT scan demonstrated left-sided floor the mouth primary tumor hypermetabolic with left-sided submandibular and level II jugular nodal metastasis. There is also a left parotid hypermetabolic nodule favor to represent metastatic disease. The nodal mass as a cystic apparently necrotic center measures approximate 4.3 cm in greatest dimension. The floor of mouth lesion measures approximately 10 cm. Patient is fairly in denial about this mass and the facial distortion caused by the lymph node metastasis. She is scheduled to go up for 2 weeks to her mother's funeral and will be returning to May 15. She's been seen  by medical oncology and is inked plan for concurrent chemoradiation. She has significant trismus making oral examination difficult.  PLANNED TREATMENT REGIMEN: Concurrent chemoradiation  PAST MEDICAL HISTORY:  has a past medical history of Hypertension; Thyroid disease; Hypothyroidism; and Hyperlipidemia.    PAST SURGICAL HISTORY:  Past Surgical History  Procedure Laterality Date  . Abdominal hysterectomy    . Finger surgery Right     thumb  . Tongue biopsy N/A 07/02/2015    Procedure: TONGUE BIOPSY;  Surgeon: Beverly Gust, MD;  Location: [facility] ORS;  Service: ENT;  Laterality: N/A;  . Fine needle aspiration N/A 07/02/2015    Procedure: FINE NEEDLE ASPIRATION;  Surgeon: Beverly Gust, MD;  Location: [facility] ORS;  Service: ENT;  Laterality: N/A;    FAMILY HISTORY: family history is not on file.  SOCIAL HISTORY:  reports that she quit smoking about 3 weeks ago. Her smoking use included Cigarettes. She has a 45 pack-year smoking history. She has never used smokeless tobacco. She reports that she does not drink alcohol or use illicit drugs.  ALLERGIES: Garlic; Lactose intolerance (gi); and Tetracyclines & related  MEDICATIONS:  Current Outpatient Prescriptions  Medication Sig Dispense Refill  . amoxicillin-clavulanate (AUGMENTIN) 875-125 MG tablet Take 1 tablet by mouth 2 (two) times daily.    Marland Kitchen atenolol (TENORMIN) 25 MG tablet Take 25 mg by mouth daily.     Marland Kitchen levothyroxine (SYNTHROID, LEVOTHROID) 50 MCG tablet     . lidocaine-prilocaine (EMLA) cream Apply 1 application topically as needed. 30 g 6  . losartan (COZAAR) 100 MG  tablet     . lovastatin (MEVACOR) 40 MG tablet Take 40 mg by mouth every evening.     . ondansetron (ZOFRAN) 8 MG tablet Take 1 tablet (8 mg total) by mouth every 8 (eight) hours as needed for nausea or vomiting (start 3 days; after chemo). 40 tablet 0  . oxyCODONE-acetaminophen (ROXICET) 5-325 MG tablet Take 1-2 tablets by mouth every 4 (four) hours as needed for  severe pain. 40 tablet 0  . prochlorperazine (COMPAZINE) 10 MG tablet Take 1 tablet (10 mg total) by mouth every 6 (six) hours as needed for nausea or vomiting. 30 tablet 0  . aspirin 325 MG tablet Take 325 mg by mouth daily. Reported on 07/16/2015     No current facility-administered medications for this encounter.    ECOG PERFORMANCE STATUS:  1 - Symptomatic but completely ambulatory  REVIEW OF SYSTEMS: Patient is having weight loss and difficulty eating secondary to trismus otherwise Patient denies any weight loss, fatigue, weakness, fever, chills or night sweats. Patient denies any loss of vision, blurred vision. Patient denies any ringing  of the ears or hearing loss. No irregular heartbeat. Patient denies heart murmur or history of fainting. Patient denies any chest pain or pain radiating to her upper extremities. Patient denies any shortness of breath, difficulty breathing at night, cough or hemoptysis. Patient denies any swelling in the lower legs. Patient denies any nausea vomiting, vomiting of blood, or coffee ground material in the vomitus. Patient denies any stomach pain. Patient states has had normal bowel movements no significant constipation or diarrhea. Patient denies any dysuria, hematuria or significant nocturia. Patient denies any problems walking, swelling in the joints or loss of balance. Patient denies any skin changes, loss of hair or loss of weight. Patient denies any excessive worrying or anxiety or significant depression. Patient denies any problems with insomnia. Patient denies excessive thirst, polyuria, polydipsia. Patient denies any swollen glands, patient denies easy bruising or easy bleeding. Patient denies any recent infections, allergies or URI. Patient "s visual fields have not changed significantly in recent time.    PHYSICAL EXAM: BP 167/88 mmHg  Pulse 74  Temp(Src) 96.8 F (36 C)  Resp 20  Wt 97 lb (44 kg) Teeth are in poor state of repair. She has significant  trismus making oral examination difficult. Her tongue is fixated to the midline. She has a significant left-sided sub-digastric firm lymph node compatible with the above-stated CT PET findings. No lower cervical or supraclavicular adenopathy is appreciated. Well-developed well-nourished patient in NAD. HEENT reveals PERLA, EOMI, discs not visualized.  Oral cavity is clear. No oral mucosal lesions are identified. Neck is clear without evidence of cervical or supraclavicular adenopathy. Lungs are clear to A&P. Cardiac examination is essentially unremarkable with regular rate and rhythm without murmur rub or thrill. Abdomen is benign with no organomegaly or masses noted. Motor sensory and DTR levels are equal and symmetric in the upper and lower extremities. Cranial nerves II through XII are grossly intact. Proprioception is intact. No peripheral adenopathy or edema is identified. No motor or sensory levels are noted. Crude visual fields are within normal range.  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 Would plan on delivering 7000 cGy to both these areas usingIMRT treatment planning and delivery. Would useIMRT to spare critical structures such as a contralateral parotid gland spinal cord. Would treat her remaining ipsilateral contralateral lymph nodes to 5400 cGy again usingIMRT treatment planning and delivery. Risks and benefits of treatment including xerostomia worsening of her poor state of dentition, alteration of blood counts, oral mucositis dysphasia all were discussed in detail with the patient. Also her loss of taste. I will have her see our dental expert on  dental care in patients receiving concurrent chemotherapy and radiation although I believe with her significant trismus at this time any extractions be extremely difficult. I have personally set up and ordered CT simulation later this week will start her treatment after her return from her trip up [place] and coordinate with medical oncology her concurrent chemotherapy.  I would like to take this opportunity for allowing me to participate in the care of your patient.Armstead Peaks., MD

## 2015-07-17 ENCOUNTER — Encounter: Payer: Self-pay | Admitting: Vascular Surgery

## 2015-07-19 ENCOUNTER — Ambulatory Visit
Admission: RE | Admit: 2015-07-19 | Discharge: 2015-07-19 | Disposition: A | Discharge status: Home / Self Care | Payer: Commercial Managed Care - HMO | Source: Ambulatory Visit | Attending: Radiation Oncology | Admitting: Radiation Oncology

## 2015-07-19 DIAGNOSIS — Z510 Encounter for antineoplastic radiation therapy: Secondary | ICD-10-CM | POA: Diagnosis not present

## 2015-07-19 DIAGNOSIS — Z7982 Long term (current) use of aspirin: Secondary | ICD-10-CM | POA: Diagnosis not present

## 2015-07-19 DIAGNOSIS — C770 Secondary and unspecified malignant neoplasm of lymph nodes of head, face and neck: Secondary | ICD-10-CM | POA: Diagnosis not present

## 2015-07-19 DIAGNOSIS — I10 Essential (primary) hypertension: Secondary | ICD-10-CM | POA: Diagnosis not present

## 2015-07-19 DIAGNOSIS — E785 Hyperlipidemia, unspecified: Secondary | ICD-10-CM | POA: Diagnosis not present

## 2015-07-19 DIAGNOSIS — E039 Hypothyroidism, unspecified: Secondary | ICD-10-CM | POA: Diagnosis not present

## 2015-07-19 DIAGNOSIS — C040 Malignant neoplasm of anterior floor of mouth: Secondary | ICD-10-CM | POA: Diagnosis not present

## 2015-07-19 DIAGNOSIS — Z79899 Other long term (current) drug therapy: Secondary | ICD-10-CM | POA: Diagnosis not present

## 2015-07-19 DIAGNOSIS — F17210 Nicotine dependence, cigarettes, uncomplicated: Secondary | ICD-10-CM | POA: Diagnosis not present

## 2015-07-19 DIAGNOSIS — R634 Abnormal weight loss: Secondary | ICD-10-CM | POA: Diagnosis not present

## 2015-07-19 DIAGNOSIS — R252 Cramp and spasm: Secondary | ICD-10-CM | POA: Diagnosis not present

## 2015-07-22 ENCOUNTER — Other Ambulatory Visit: Payer: Self-pay | Admitting: Internal Medicine

## 2015-07-23 DIAGNOSIS — C049 Malignant neoplasm of floor of mouth, unspecified: Secondary | ICD-10-CM | POA: Diagnosis not present

## 2015-07-23 DIAGNOSIS — R634 Abnormal weight loss: Secondary | ICD-10-CM | POA: Diagnosis not present

## 2015-07-23 DIAGNOSIS — C040 Malignant neoplasm of anterior floor of mouth: Secondary | ICD-10-CM | POA: Diagnosis not present

## 2015-07-23 DIAGNOSIS — E785 Hyperlipidemia, unspecified: Secondary | ICD-10-CM | POA: Diagnosis not present

## 2015-07-23 DIAGNOSIS — R252 Cramp and spasm: Secondary | ICD-10-CM | POA: Diagnosis not present

## 2015-07-23 DIAGNOSIS — C770 Secondary and unspecified malignant neoplasm of lymph nodes of head, face and neck: Secondary | ICD-10-CM | POA: Diagnosis not present

## 2015-07-23 DIAGNOSIS — Z510 Encounter for antineoplastic radiation therapy: Secondary | ICD-10-CM | POA: Diagnosis not present

## 2015-07-23 DIAGNOSIS — E039 Hypothyroidism, unspecified: Secondary | ICD-10-CM | POA: Diagnosis not present

## 2015-07-23 DIAGNOSIS — F17210 Nicotine dependence, cigarettes, uncomplicated: Secondary | ICD-10-CM | POA: Diagnosis not present

## 2015-07-23 DIAGNOSIS — I10 Essential (primary) hypertension: Secondary | ICD-10-CM | POA: Diagnosis not present

## 2015-07-24 ENCOUNTER — Other Ambulatory Visit: Payer: Self-pay | Admitting: Internal Medicine

## 2015-07-24 DIAGNOSIS — I10 Essential (primary) hypertension: Secondary | ICD-10-CM | POA: Diagnosis not present

## 2015-07-24 DIAGNOSIS — Z510 Encounter for antineoplastic radiation therapy: Secondary | ICD-10-CM | POA: Diagnosis not present

## 2015-07-24 DIAGNOSIS — C770 Secondary and unspecified malignant neoplasm of lymph nodes of head, face and neck: Secondary | ICD-10-CM | POA: Diagnosis not present

## 2015-07-24 DIAGNOSIS — E785 Hyperlipidemia, unspecified: Secondary | ICD-10-CM | POA: Diagnosis not present

## 2015-07-24 DIAGNOSIS — R634 Abnormal weight loss: Secondary | ICD-10-CM | POA: Diagnosis not present

## 2015-07-24 DIAGNOSIS — C049 Malignant neoplasm of floor of mouth, unspecified: Secondary | ICD-10-CM | POA: Diagnosis not present

## 2015-07-24 DIAGNOSIS — E039 Hypothyroidism, unspecified: Secondary | ICD-10-CM | POA: Diagnosis not present

## 2015-07-24 DIAGNOSIS — C040 Malignant neoplasm of anterior floor of mouth: Secondary | ICD-10-CM | POA: Diagnosis not present

## 2015-07-24 DIAGNOSIS — R252 Cramp and spasm: Secondary | ICD-10-CM | POA: Diagnosis not present

## 2015-07-24 DIAGNOSIS — F17210 Nicotine dependence, cigarettes, uncomplicated: Secondary | ICD-10-CM | POA: Diagnosis not present

## 2015-07-25 ENCOUNTER — Other Ambulatory Visit: Payer: Self-pay | Admitting: Internal Medicine

## 2015-07-29 ENCOUNTER — Inpatient Hospital Stay: Payer: Commercial Managed Care - HMO

## 2015-07-29 ENCOUNTER — Ambulatory Visit: Payer: Commercial Managed Care - HMO

## 2015-07-29 ENCOUNTER — Inpatient Hospital Stay (HOSPITAL_BASED_OUTPATIENT_CLINIC_OR_DEPARTMENT_OTHER): Payer: Commercial Managed Care - HMO | Admitting: Internal Medicine

## 2015-07-29 VITALS — BP 168/77 | HR 58 | Temp 96.5°F | Resp 18 | Wt 92.4 lb

## 2015-07-29 DIAGNOSIS — R252 Cramp and spasm: Secondary | ICD-10-CM | POA: Diagnosis not present

## 2015-07-29 DIAGNOSIS — W19XXXA Unspecified fall, initial encounter: Secondary | ICD-10-CM | POA: Diagnosis not present

## 2015-07-29 DIAGNOSIS — F17210 Nicotine dependence, cigarettes, uncomplicated: Secondary | ICD-10-CM | POA: Diagnosis not present

## 2015-07-29 DIAGNOSIS — Z95828 Presence of other vascular implants and grafts: Secondary | ICD-10-CM

## 2015-07-29 DIAGNOSIS — C01 Malignant neoplasm of base of tongue: Secondary | ICD-10-CM | POA: Diagnosis not present

## 2015-07-29 DIAGNOSIS — C049 Malignant neoplasm of floor of mouth, unspecified: Secondary | ICD-10-CM | POA: Diagnosis not present

## 2015-07-29 DIAGNOSIS — Z79899 Other long term (current) drug therapy: Secondary | ICD-10-CM

## 2015-07-29 DIAGNOSIS — IMO0002 Reserved for concepts with insufficient information to code with codable children: Secondary | ICD-10-CM

## 2015-07-29 DIAGNOSIS — C040 Malignant neoplasm of anterior floor of mouth: Secondary | ICD-10-CM | POA: Diagnosis not present

## 2015-07-29 DIAGNOSIS — R634 Abnormal weight loss: Secondary | ICD-10-CM

## 2015-07-29 DIAGNOSIS — D649 Anemia, unspecified: Secondary | ICD-10-CM | POA: Diagnosis not present

## 2015-07-29 DIAGNOSIS — R200 Anesthesia of skin: Secondary | ICD-10-CM | POA: Diagnosis not present

## 2015-07-29 DIAGNOSIS — Z5111 Encounter for antineoplastic chemotherapy: Secondary | ICD-10-CM | POA: Diagnosis not present

## 2015-07-29 DIAGNOSIS — C799 Secondary malignant neoplasm of unspecified site: Secondary | ICD-10-CM

## 2015-07-29 DIAGNOSIS — Z510 Encounter for antineoplastic radiation therapy: Secondary | ICD-10-CM | POA: Diagnosis not present

## 2015-07-29 DIAGNOSIS — C770 Secondary and unspecified malignant neoplasm of lymph nodes of head, face and neck: Secondary | ICD-10-CM | POA: Diagnosis not present

## 2015-07-29 DIAGNOSIS — E039 Hypothyroidism, unspecified: Secondary | ICD-10-CM | POA: Diagnosis not present

## 2015-07-29 DIAGNOSIS — I10 Essential (primary) hypertension: Secondary | ICD-10-CM | POA: Diagnosis not present

## 2015-07-29 DIAGNOSIS — E785 Hyperlipidemia, unspecified: Secondary | ICD-10-CM | POA: Diagnosis not present

## 2015-07-29 LAB — CBC WITH DIFFERENTIAL/PLATELET
BASOS ABS: 0.1 10*3/uL (ref 0–0.1)
Basophils Relative: 1 %
Eosinophils Absolute: 0.2 10*3/uL (ref 0–0.7)
Eosinophils Relative: 2 %
HEMATOCRIT: 35.4 % (ref 35.0–47.0)
HEMOGLOBIN: 12.4 g/dL (ref 12.0–16.0)
LYMPHS PCT: 17 %
Lymphs Abs: 1.5 10*3/uL (ref 1.0–3.6)
MCH: 32.3 pg (ref 26.0–34.0)
MCHC: 35.2 g/dL (ref 32.0–36.0)
MCV: 91.8 fL (ref 80.0–100.0)
MONO ABS: 0.5 10*3/uL (ref 0.2–0.9)
Monocytes Relative: 6 %
NEUTROS ABS: 6.4 10*3/uL (ref 1.4–6.5)
NEUTROS PCT: 74 %
Platelets: 215 10*3/uL (ref 150–440)
RBC: 3.85 MIL/uL (ref 3.80–5.20)
RDW: 13.4 % (ref 11.5–14.5)
WBC: 8.6 10*3/uL (ref 3.6–11.0)

## 2015-07-29 LAB — COMPREHENSIVE METABOLIC PANEL
ALBUMIN: 4.2 g/dL (ref 3.5–5.0)
ALK PHOS: 73 U/L (ref 38–126)
ALT: 14 U/L (ref 14–54)
AST: 23 U/L (ref 15–41)
Anion gap: 6 (ref 5–15)
BILIRUBIN TOTAL: 0.7 mg/dL (ref 0.3–1.2)
BUN: 17 mg/dL (ref 6–20)
CALCIUM: 8.8 mg/dL — AB (ref 8.9–10.3)
CO2: 24 mmol/L (ref 22–32)
CREATININE: 0.81 mg/dL (ref 0.44–1.00)
Chloride: 104 mmol/L (ref 101–111)
GFR calc Af Amer: >60 mL/min (ref >60)
GLUCOSE: 162 mg/dL — AB (ref 65–99)
Potassium: 3.9 mmol/L (ref 3.5–5.1)
Sodium: 134 mmol/L — ABNORMAL LOW (ref 135–145)
TOTAL PROTEIN: 7.1 g/dL (ref 6.5–8.1)

## 2015-07-29 MED ORDER — PALONOSETRON HCL INJECTION 0.25 MG/5ML
0.2500 mg | Freq: Once | INTRAVENOUS | Status: AC
  Administered 2015-07-29: 0.25 mg via INTRAVENOUS
  Filled 2015-07-29: qty 5

## 2015-07-29 MED ORDER — HEPARIN SOD (PORK) LOCK FLUSH 100 UNIT/ML IV SOLN
500.0000 [IU] | Freq: Once | INTRAVENOUS | Status: AC | PRN
  Administered 2015-07-29: 500 [IU]
  Filled 2015-07-29: qty 5

## 2015-07-29 MED ORDER — SODIUM CHLORIDE 0.9 % IV SOLN
Freq: Once | INTRAVENOUS | Status: AC
  Administered 2015-07-29: 10:00:00 via INTRAVENOUS
  Filled 2015-07-29: qty 1000

## 2015-07-29 MED ORDER — DEXTROSE-NACL 5-0.45 % IV SOLN
Freq: Once | INTRAVENOUS | Status: AC
  Administered 2015-07-29: 10:00:00 via INTRAVENOUS
  Filled 2015-07-29: qty 10

## 2015-07-29 MED ORDER — SODIUM CHLORIDE 0.9 % IV SOLN
Freq: Once | INTRAVENOUS | Status: AC
  Administered 2015-07-29: 12:00:00 via INTRAVENOUS
  Filled 2015-07-29: qty 5

## 2015-07-29 MED ORDER — SODIUM CHLORIDE 0.9 % IV SOLN
40.0000 mg/m2 | Freq: Once | INTRAVENOUS | Status: AC
  Administered 2015-07-29: 55 mg via INTRAVENOUS
  Filled 2015-07-29: qty 55

## 2015-07-29 NOTE — Progress Notes (Signed)
[facility] NOTE  Patient Care Team: Tracie Harrier, MD as PCP - General (Internal Medicine) Noreene Filbert, MD as Referring Physician (Radiation Oncology) Algernon Huxley, MD as Referring Physician (Vascular Surgery)  CHIEF COMPLAINTS/PURPOSE OF CONSULTATION:   # April 2017-SCC [STAGE IVA T4N2; Dr.McQueen; Left floor of mouth -Bx; Left neck LN-FNA+] left tongue/ floor of mouth- 4 x 3 x 2 cm.; 4.3 x 3.3 x 3 cm complex mass extends through the left mylohyoid muscle below the left mandible anterior to left submandibular gland] - START Cis [5/15]- weekly with RT [5/17]  # Smoker # weight loss  HISTORY OF PRESENTING ILLNESS:  Rebecca Mcmillan 70 y.o.  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  She is ready to start chemotherapy today. Denies any pain in the submandibular region. Denies any worsening growth of the left neck mass.  Otherwise no nausea no vomiting. She denies any pain. Denies any difficulty swallowing pain with swallowing or any hoarseness of voice. Denies any shortness of breath or chest pain.   ROS: A complete 10 point review of system is done which is negative except mentioned above in history of present illness  MEDICAL HISTORY:  Past Medical History  Diagnosis Date  . Hypertension   . Thyroid disease   . Hypothyroidism   . Hyperlipidemia     SURGICAL HISTORY: None  SOCIAL HISTORY: Patient previously used to work in nursing homes. Denies any alcohol. Smokes a pack of Celexa day for 45 years lives with her husband in [place]. No children.  Social History   Social History  . Marital Status: Married    Spouse Name: N/A  . Number of Children: N/A  . Years of Education: N/A   Occupational History  . Not on file.   Social History Main Topics  . Smoking status: Former Smoker -- 1.00 packs/day for 45 years    Types: Cigarettes    Quit date:  06/19/2015  . Smokeless tobacco: Never Used  . Alcohol Use: No  . Drug Use: No  . Sexual Activity: Not on file   Other Topics Concern  . Not on file   Social History Narrative    FAMILY HISTORY:no family history of head and neck cancer.   ALLERGIES:  is allergic to garlic; lactose intolerance (gi); and tetracyclines & related.  MEDICATIONS:  Current Outpatient Prescriptions  Medication Sig Dispense Refill  . aspirin 325 MG tablet Take 325 mg by mouth daily. Reported on 07/16/2015    . atenolol (TENORMIN) 25 MG tablet Take 25 mg by mouth daily.     Marland Kitchen levothyroxine (SYNTHROID, LEVOTHROID) 50 MCG tablet     . lidocaine-prilocaine (EMLA) cream Apply 1 application topically as needed. 30 g 6  . losartan (COZAAR) 100 MG tablet     . lovastatin (MEVACOR) 40 MG tablet Take 40 mg by mouth every evening.     . ondansetron (ZOFRAN) 8 MG tablet TAKE 1 TABLET EVERY 8 HOURS AS NEEDED FOR NAUSEA AND VOMITING (START 3 DAYS AFTER CHEMOTHERAPY) 40 tablet 0  . oxyCODONE-acetaminophen (ROXICET) 5-325 MG tablet Take 1-2 tablets by mouth every 4 (four) hours as needed for severe pain. 40 tablet 0  . prochlorperazine (COMPAZINE) 10 MG tablet TAKE 1 TABLET EVERY 6 HOURS AS NEEDED FOR NAUSEA AND VOMITING 30 tablet 0   No current facility-administered medications for this visit.      Marland Kitchen  PHYSICAL EXAMINATION: ECOG PERFORMANCE STATUS: 1 - Symptomatic but completely ambulatory  Filed Vitals:   07/29/15 0842  BP: 168/77  Pulse: 58  Temp: 96.5 F (35.8 C)  Resp: 18   Filed Weights   07/29/15 0842  Weight: 92 lb 6 oz (41.9 kg)    GENERAL: Thin built moderately nourished Caucasian female patient anxious. Alert, no distress and comfortable. She is Accompanied by her husband.  EYES: no pallor or icterus OROPHARYNX: no thrush or ulceration; poor dentition. No obvious masses noted in the oral cavity. NECK: supple, 5-6cm mass/firm- submandibular mass. Non-tender.  LYMPH:  no palpable lymphadenopathy  xillary or inguinal regions LUNGS: Decreased breath sounds bilaterally No wheeze or crackles; barrel chested.  HEART/CVS: regular rate & rhythm and no murmurs; No lower extremity edema ABDOMEN: abdomen soft, non-tender and normal bowel sounds Musculoskeletal:no cyanosis of digits and no clubbing  PSYCH: alert & oriented x 3 with fluent speech NEURO: no focal motor/sensory deficits SKIN:  no rashes or significant lesions  LABORATORY DATA:  I have reviewed the data as listed Lab Results  Component Value Date   WBC 8.6 07/29/2015   HGB 12.4 07/29/2015   HCT 35.4 07/29/2015   MCV 91.8 07/29/2015   PLT 215 07/29/2015    Recent Labs  06/21/15 1029 06/28/15 1139  NA 126* 134*  K 3.9 3.8  CL 91* 103  CO2 26 24  GLUCOSE 108* 93  BUN 16 13  CREATININE 0.72 0.74  CALCIUM 9.0 8.8*  GFRNONAA >60 >60  GFRAA >60 >60  PROT 8.1  --   ALBUMIN 4.9  --   AST 22  --   ALT 17  --   ALKPHOS 74  --   BILITOT 0.8  --     ASSESSMENT & PLAN:   # 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 Unresectable. We will Start weekly cisplatin today; patient will start radiation on May 17.  # CBC CMP within normal limits. Again reviewed the symptoms/side effects of cisplatin of nausea vomiting tingling and numbness with the patient  # Also discussed regarding PEG tube given the weight loss- patient reluctant. Hold off For now.  # patient will get weekly CBC BMP and magnesium/cisplatin; follow-up with me in 2 weeks.    Cammie Sickle, MD 07/29/2015 8:49 AM

## 2015-07-30 ENCOUNTER — Ambulatory Visit
Admission: RE | Admit: 2015-07-30 | Discharge: 2015-07-30 | Disposition: A | Discharge status: Home / Self Care | Payer: Commercial Managed Care - HMO | Source: Ambulatory Visit | Attending: Radiation Oncology | Admitting: Radiation Oncology

## 2015-07-31 ENCOUNTER — Telehealth: Payer: Self-pay | Admitting: *Deleted

## 2015-07-31 ENCOUNTER — Inpatient Hospital Stay (HOSPITAL_BASED_OUTPATIENT_CLINIC_OR_DEPARTMENT_OTHER): Payer: Commercial Managed Care - HMO | Admitting: Family Medicine

## 2015-07-31 ENCOUNTER — Ambulatory Visit
Admission: RE | Admit: 2015-07-31 | Discharge: 2015-07-31 | Disposition: A | Discharge status: Home / Self Care | Payer: Commercial Managed Care - HMO | Source: Ambulatory Visit | Attending: Family Medicine | Admitting: Family Medicine

## 2015-07-31 ENCOUNTER — Ambulatory Visit
Admission: RE | Admit: 2015-07-31 | Discharge: 2015-07-31 | Disposition: A | Discharge status: Home / Self Care | Payer: Commercial Managed Care - HMO | Source: Ambulatory Visit | Attending: Radiation Oncology | Admitting: Radiation Oncology

## 2015-07-31 ENCOUNTER — Other Ambulatory Visit: Payer: Self-pay | Admitting: Internal Medicine

## 2015-07-31 VITALS — BP 152/69 | HR 50 | Temp 97.0°F | Wt 94.6 lb

## 2015-07-31 DIAGNOSIS — M25432 Effusion, left wrist: Secondary | ICD-10-CM | POA: Insufficient documentation

## 2015-07-31 DIAGNOSIS — C049 Malignant neoplasm of floor of mouth, unspecified: Secondary | ICD-10-CM | POA: Diagnosis not present

## 2015-07-31 DIAGNOSIS — W19XXXA Unspecified fall, initial encounter: Secondary | ICD-10-CM

## 2015-07-31 DIAGNOSIS — C01 Malignant neoplasm of base of tongue: Secondary | ICD-10-CM

## 2015-07-31 DIAGNOSIS — I700 Atherosclerosis of aorta: Secondary | ICD-10-CM | POA: Diagnosis not present

## 2015-07-31 DIAGNOSIS — C770 Secondary and unspecified malignant neoplasm of lymph nodes of head, face and neck: Secondary | ICD-10-CM | POA: Diagnosis not present

## 2015-07-31 DIAGNOSIS — Z79899 Other long term (current) drug therapy: Secondary | ICD-10-CM | POA: Diagnosis not present

## 2015-07-31 DIAGNOSIS — Z510 Encounter for antineoplastic radiation therapy: Secondary | ICD-10-CM | POA: Diagnosis not present

## 2015-07-31 DIAGNOSIS — D649 Anemia, unspecified: Secondary | ICD-10-CM | POA: Diagnosis not present

## 2015-07-31 DIAGNOSIS — Z5111 Encounter for antineoplastic chemotherapy: Secondary | ICD-10-CM | POA: Diagnosis not present

## 2015-07-31 DIAGNOSIS — M546 Pain in thoracic spine: Secondary | ICD-10-CM | POA: Diagnosis not present

## 2015-07-31 DIAGNOSIS — R200 Anesthesia of skin: Secondary | ICD-10-CM | POA: Insufficient documentation

## 2015-07-31 DIAGNOSIS — M479 Spondylosis, unspecified: Secondary | ICD-10-CM | POA: Insufficient documentation

## 2015-07-31 DIAGNOSIS — R634 Abnormal weight loss: Secondary | ICD-10-CM | POA: Diagnosis not present

## 2015-07-31 DIAGNOSIS — M5136 Other intervertebral disc degeneration, lumbar region: Secondary | ICD-10-CM | POA: Diagnosis not present

## 2015-07-31 DIAGNOSIS — C040 Malignant neoplasm of anterior floor of mouth: Secondary | ICD-10-CM | POA: Diagnosis not present

## 2015-07-31 DIAGNOSIS — S6992XA Unspecified injury of left wrist, hand and finger(s), initial encounter: Secondary | ICD-10-CM | POA: Diagnosis not present

## 2015-07-31 DIAGNOSIS — R252 Cramp and spasm: Secondary | ICD-10-CM | POA: Diagnosis not present

## 2015-07-31 DIAGNOSIS — E785 Hyperlipidemia, unspecified: Secondary | ICD-10-CM | POA: Diagnosis not present

## 2015-07-31 DIAGNOSIS — F17210 Nicotine dependence, cigarettes, uncomplicated: Secondary | ICD-10-CM

## 2015-07-31 DIAGNOSIS — S299XXA Unspecified injury of thorax, initial encounter: Secondary | ICD-10-CM | POA: Diagnosis not present

## 2015-07-31 DIAGNOSIS — E039 Hypothyroidism, unspecified: Secondary | ICD-10-CM | POA: Diagnosis not present

## 2015-07-31 DIAGNOSIS — I10 Essential (primary) hypertension: Secondary | ICD-10-CM | POA: Diagnosis not present

## 2015-07-31 DIAGNOSIS — S3992XA Unspecified injury of lower back, initial encounter: Secondary | ICD-10-CM | POA: Diagnosis not present

## 2015-07-31 NOTE — Progress Notes (Signed)
Pt here for add on/ acute visit. She had her 1st Cisplatin Tx on Monday of this week. She reports her R leg suddenly went completely numb, her leg went out from under her causing her to fall down concrete steps unto concrete patio. She injured her R pinkie toe with bruising present. Her Left wrist is sore and swollen. R leg feels fine today. No numbness. She has had mild ringing in her ears since her chemo treatment.  She took her 1st XRT treatment today. Currently eating and swolling okay. Denies any nausea.

## 2015-07-31 NOTE — Telephone Encounter (Signed)
i just spoke to Rebecca Mcmillan- she can see her today. Please confirm with pt. Thanks

## 2015-07-31 NOTE — Telephone Encounter (Signed)
appt given for right after XRT today and agreed on

## 2015-07-31 NOTE — Progress Notes (Signed)
[facility]  [phone]  [phone]     Rebecca Mcmillan DOB: 09/24/1945  MR#: [id number]  EVO#:[id number]  Patient Care Team: Tracie Harrier, MD as PCP - General (Internal Medicine) Noreene Filbert, MD as Referring Physician (Radiation Oncology) Algernon Huxley, MD as Referring Physician (Vascular Surgery)  CHIEF COMPLAINT:  Chief Complaint  Patient presents with  . Follow-up    Leg numbness and s/p fall    INTERVAL HISTORY:  Patient is here for follow-up regarding the right lower extremity numbness that lasted approximately 15 minutes and resulted in a fall on Tuesday May 16th, 2017.  Chemotherapy with cisplatin and concurrent radiation therapy was started on Monday, May 15 for recent diagnosis of squamous cell carcinoma of the floor the mouth. Patient reports having peripheral vascular disease in the left lower extremity but never any previous issue with the right lower extremity. She reports that the right leg suddenly went completely numb which resulted in a fall in which she landed on her left wrist and hit her right fifth toe. There is swelling of the left wrist and bruising over the toe but no complaints of pain. There was no head injury, no skin tears or lacerations, and patient did not want to go to the emergency room.  REVIEW OF SYSTEMS:   Review of Systems  Constitutional: Negative for fever, chills, weight loss, malaise/fatigue and diaphoresis.  HENT: Negative.   Eyes: Negative.   Respiratory: Negative.   Cardiovascular: Negative.   Gastrointestinal: Negative.   Genitourinary: Negative.   Musculoskeletal: Positive for joint pain and falls.       Pain in the left wrist and right fifth toe following fall related to sudden numbness of right lower extremity.  Skin: Negative.   Neurological: Positive for focal weakness. Negative for dizziness, tingling, seizures and weakness.       Right leg numbness lasting approximately 15 minutes    Endo/Heme/Allergies: Does not bruise/bleed easily.  Psychiatric/Behavioral: Negative for depression. The patient is not nervous/anxious and does not have insomnia.     As per HPI. Otherwise, a complete review of systems is negatve.  PAST MEDICAL HISTORY: Past Medical History  Diagnosis Date  . Hypertension   . Thyroid disease   . Hypothyroidism   . Hyperlipidemia     PAST SURGICAL HISTORY: Past Surgical History  Procedure Laterality Date  . Abdominal hysterectomy    . Finger surgery Right     thumb  . Tongue biopsy N/A 07/02/2015    Procedure: TONGUE BIOPSY;  Surgeon: Beverly Gust, MD;  Location: [facility];  Service: ENT;  Laterality: N/A;  . Fine needle aspiration N/A 07/02/2015    Procedure: FINE NEEDLE ASPIRATION;  Surgeon: Beverly Gust, MD;  Location: [facility];  Service: ENT;  Laterality: N/A;  . Peripheral vascular catheterization N/A 07/15/2015    Procedure: Glori Luis Cath Insertion;  Surgeon: Algernon Huxley, MD;  Location: [facility];  Service: Cardiovascular;  Laterality: N/A;    FAMILY HISTORY No family history on file.  GYNECOLOGIC HISTORY:  No LMP recorded. Patient has had a hysterectomy.     ADVANCED DIRECTIVES:    HEALTH MAINTENANCE: Social History  Substance Use Topics  . Smoking status: Former Smoker -- 1.00 packs/day for 45 years    Types: Cigarettes    Quit date: 06/19/2015  . Smokeless tobacco: Never Used  . Alcohol Use: No     Colonoscopy:  PAP:  Bone density:  Mammogram:  Allergies  Allergen Reactions  .  Garlic Swelling    "Eyes swell up"  . Lactose Intolerance (Gi)   . Tetracyclines & Related     Per patient breaks out in hives given 5-6 years ago pt doesn't remember where this happened     Current Outpatient Prescriptions  Medication Sig Dispense Refill  . aspirin 325 MG tablet Take 325 mg by mouth daily. Reported on 07/16/2015    . atenolol (TENORMIN) 25 MG tablet Take 25 mg by mouth daily.     Marland Kitchen levothyroxine (SYNTHROID,  LEVOTHROID) 50 MCG tablet     . lidocaine-prilocaine (EMLA) cream Apply 1 application topically as needed. 30 g 6  . losartan (COZAAR) 100 MG tablet     . lovastatin (MEVACOR) 40 MG tablet Take 40 mg by mouth every evening.     . ondansetron (ZOFRAN) 8 MG tablet TAKE 1 TABLET EVERY 8 HOURS AS NEEDED FOR NAUSEA AND VOMITING (START 3 DAYS AFTER CHEMOTHERAPY) 40 tablet 0  . oxyCODONE-acetaminophen (ROXICET) 5-325 MG tablet Take 1-2 tablets by mouth every 4 (four) hours as needed for severe pain. 40 tablet 0  . prochlorperazine (COMPAZINE) 10 MG tablet TAKE 1 TABLET EVERY 6 HOURS AS NEEDED FOR NAUSEA AND VOMITING 30 tablet 0   No current facility-administered medications for this visit.    OBJECTIVE: BP 152/69 mmHg  Pulse 50  Temp(Src) 97 F (36.1 C) (Tympanic)  Wt 94 lb 9.2 oz (42.9 kg)   Body mass index is 17.88 kg/(m^2).    ECOG FS:1 - Symptomatic but completely ambulatory  General: Well-developed, well-nourished, no acute distress. Eyes: Pink conjunctiva, anicteric sclera. HEENT: Normocephalic, moist mucous membranes, clear oropharnyx. Musculoskeletal: mild edema associated with the left wrist, ecchymosis over right foot and fifth toe. Neuro: Alert, answering all questions appropriately. Cranial nerves grossly intact. No areas of numbness  Skin: No rashes or petechiae noted. Psych: Normal affect.   LAB RESULTS:  Infusion on 07/29/2015  Component Date Value Ref Range Status  . WBC 07/29/2015 8.6  3.6 - 11.0 K/uL Final  . RBC 07/29/2015 3.85  3.80 - 5.20 MIL/uL Final  . Hemoglobin 07/29/2015 12.4  12.0 - 16.0 g/dL Final  . HCT 07/29/2015 35.4  35.0 - 47.0 % Final  . MCV 07/29/2015 91.8  80.0 - 100.0 fL Final  . MCH 07/29/2015 32.3  26.0 - 34.0 pg Final  . MCHC 07/29/2015 35.2  32.0 - 36.0 g/dL Final  . RDW 07/29/2015 13.4  11.5 - 14.5 % Final  . Platelets 07/29/2015 215  150 - 440 K/uL Final  . Neutrophils Relative % 07/29/2015 74   Final  . Neutro Abs 07/29/2015 6.4  1.4 - 6.5  K/uL Final  . Lymphocytes Relative 07/29/2015 17   Final  . Lymphs Abs 07/29/2015 1.5  1.0 - 3.6 K/uL Final  . Monocytes Relative 07/29/2015 6   Final  . Monocytes Absolute 07/29/2015 0.5  0.2 - 0.9 K/uL Final  . Eosinophils Relative 07/29/2015 2   Final  . Eosinophils Absolute 07/29/2015 0.2  0 - 0.7 K/uL Final  . Basophils Relative 07/29/2015 1   Final  . Basophils Absolute 07/29/2015 0.1  0 - 0.1 K/uL Final  . Sodium 07/29/2015 134* 135 - 145 mmol/L Final  . Potassium 07/29/2015 3.9  3.5 - 5.1 mmol/L Final  . Chloride 07/29/2015 104  101 - 111 mmol/L Final  . CO2 07/29/2015 24  22 - 32 mmol/L Final  . Glucose, Bld 07/29/2015 162* 65 - 99 mg/dL Final  . BUN 07/29/2015 17  6 - 20 mg/dL Final  . Creatinine, Ser 07/29/2015 0.81  0.44 - 1.00 mg/dL Final  . Calcium 07/29/2015 8.8* 8.9 - 10.3 mg/dL Final  . Total Protein 07/29/2015 7.1  6.5 - 8.1 g/dL Final  . Albumin 07/29/2015 4.2  3.5 - 5.0 g/dL Final  . AST 07/29/2015 23  15 - 41 U/L Final  . ALT 07/29/2015 14  14 - 54 U/L Final  . Alkaline Phosphatase 07/29/2015 73  38 - 126 U/L Final  . Total Bilirubin 07/29/2015 0.7  0.3 - 1.2 mg/dL Final  . GFR calc non Af Amer 07/29/2015 >60  >60 mL/min Final  . GFR calc Af Amer 07/29/2015 >60  >60 mL/min Final   Comment: (NOTE) The eGFR has been calculated using the CKD EPI equation. This calculation has not been validated in all clinical situations. eGFR's persistently <60 mL/min signify possible Chronic Kidney Disease.   . Anion gap 07/29/2015 6  5 - 15 Final    STUDIES: No results found.  ASSESSMENT:  Right lower extremity numbness resulting in fall.  PLAN:  1. Right lower extremity numbness resulting in fall. Unsure of exact etiology of numbness as patient has never experienced this before. She does note some peripheral vascular disease in the left lower extremity, she has not had follow-up with her vascular surgeon in several months. As patient does have some swelling of the left  wrist from fall we'll proceed with x-rays for evaluation to ensure there is no fracture. We'll also obtain plain films of the lumbar and thoracic spine to ensure that there is no pinched nerve or bulging disc. We will update patient regarding results as they are received.   Patient expressed understanding and was in agreement with this plan. She also understands that She can call clinic at any time with any questions, concerns, or complaints.   Dr. Rogue Bussing was available for consultation and an active participant in the planning of care for this patient.  Evlyn Kanner, NP   07/31/2015 2:42 PM

## 2015-07-31 NOTE — Telephone Encounter (Signed)
Reports that her right leg keeps going numb on her and she actually fell off the back steps yesterday because of it. Please advise

## 2015-08-01 ENCOUNTER — Ambulatory Visit
Admission: RE | Admit: 2015-08-01 | Discharge: 2015-08-01 | Disposition: A | Discharge status: Home / Self Care | Payer: Commercial Managed Care - HMO | Source: Ambulatory Visit | Attending: Radiation Oncology | Admitting: Radiation Oncology

## 2015-08-01 ENCOUNTER — Ambulatory Visit: Payer: Commercial Managed Care - HMO

## 2015-08-01 DIAGNOSIS — C049 Malignant neoplasm of floor of mouth, unspecified: Secondary | ICD-10-CM | POA: Diagnosis not present

## 2015-08-01 DIAGNOSIS — R634 Abnormal weight loss: Secondary | ICD-10-CM | POA: Diagnosis not present

## 2015-08-01 DIAGNOSIS — C770 Secondary and unspecified malignant neoplasm of lymph nodes of head, face and neck: Secondary | ICD-10-CM | POA: Diagnosis not present

## 2015-08-01 DIAGNOSIS — Z510 Encounter for antineoplastic radiation therapy: Secondary | ICD-10-CM | POA: Diagnosis not present

## 2015-08-01 DIAGNOSIS — E785 Hyperlipidemia, unspecified: Secondary | ICD-10-CM | POA: Diagnosis not present

## 2015-08-01 DIAGNOSIS — E039 Hypothyroidism, unspecified: Secondary | ICD-10-CM | POA: Diagnosis not present

## 2015-08-01 DIAGNOSIS — F17210 Nicotine dependence, cigarettes, uncomplicated: Secondary | ICD-10-CM | POA: Diagnosis not present

## 2015-08-01 DIAGNOSIS — R252 Cramp and spasm: Secondary | ICD-10-CM | POA: Diagnosis not present

## 2015-08-01 DIAGNOSIS — C040 Malignant neoplasm of anterior floor of mouth: Secondary | ICD-10-CM | POA: Diagnosis not present

## 2015-08-01 DIAGNOSIS — I10 Essential (primary) hypertension: Secondary | ICD-10-CM | POA: Diagnosis not present

## 2015-08-02 ENCOUNTER — Ambulatory Visit: Payer: Commercial Managed Care - HMO

## 2015-08-02 ENCOUNTER — Ambulatory Visit
Admission: RE | Admit: 2015-08-02 | Discharge: 2015-08-02 | Disposition: A | Discharge status: Home / Self Care | Payer: Commercial Managed Care - HMO | Source: Ambulatory Visit | Attending: Radiation Oncology | Admitting: Radiation Oncology

## 2015-08-02 DIAGNOSIS — E039 Hypothyroidism, unspecified: Secondary | ICD-10-CM | POA: Diagnosis not present

## 2015-08-02 DIAGNOSIS — F17210 Nicotine dependence, cigarettes, uncomplicated: Secondary | ICD-10-CM | POA: Diagnosis not present

## 2015-08-02 DIAGNOSIS — I10 Essential (primary) hypertension: Secondary | ICD-10-CM | POA: Diagnosis not present

## 2015-08-02 DIAGNOSIS — R252 Cramp and spasm: Secondary | ICD-10-CM | POA: Diagnosis not present

## 2015-08-02 DIAGNOSIS — Z510 Encounter for antineoplastic radiation therapy: Secondary | ICD-10-CM | POA: Diagnosis not present

## 2015-08-02 DIAGNOSIS — E785 Hyperlipidemia, unspecified: Secondary | ICD-10-CM | POA: Diagnosis not present

## 2015-08-02 DIAGNOSIS — C049 Malignant neoplasm of floor of mouth, unspecified: Secondary | ICD-10-CM | POA: Diagnosis not present

## 2015-08-02 DIAGNOSIS — C040 Malignant neoplasm of anterior floor of mouth: Secondary | ICD-10-CM | POA: Diagnosis not present

## 2015-08-02 DIAGNOSIS — C770 Secondary and unspecified malignant neoplasm of lymph nodes of head, face and neck: Secondary | ICD-10-CM | POA: Diagnosis not present

## 2015-08-02 DIAGNOSIS — R634 Abnormal weight loss: Secondary | ICD-10-CM | POA: Diagnosis not present

## 2015-08-05 ENCOUNTER — Inpatient Hospital Stay: Payer: Commercial Managed Care - HMO

## 2015-08-05 ENCOUNTER — Other Ambulatory Visit: Payer: Self-pay | Admitting: Internal Medicine

## 2015-08-05 ENCOUNTER — Ambulatory Visit: Payer: Commercial Managed Care - HMO

## 2015-08-05 ENCOUNTER — Ambulatory Visit
Admission: RE | Admit: 2015-08-05 | Discharge: 2015-08-05 | Disposition: A | Discharge status: Home / Self Care | Payer: Commercial Managed Care - HMO | Source: Ambulatory Visit | Attending: Radiation Oncology | Admitting: Radiation Oncology

## 2015-08-05 DIAGNOSIS — C040 Malignant neoplasm of anterior floor of mouth: Secondary | ICD-10-CM | POA: Diagnosis not present

## 2015-08-05 DIAGNOSIS — Z79899 Other long term (current) drug therapy: Secondary | ICD-10-CM | POA: Diagnosis not present

## 2015-08-05 DIAGNOSIS — Z510 Encounter for antineoplastic radiation therapy: Secondary | ICD-10-CM | POA: Diagnosis not present

## 2015-08-05 DIAGNOSIS — R252 Cramp and spasm: Secondary | ICD-10-CM | POA: Diagnosis not present

## 2015-08-05 DIAGNOSIS — R200 Anesthesia of skin: Secondary | ICD-10-CM | POA: Diagnosis not present

## 2015-08-05 DIAGNOSIS — C01 Malignant neoplasm of base of tongue: Secondary | ICD-10-CM | POA: Diagnosis not present

## 2015-08-05 DIAGNOSIS — E785 Hyperlipidemia, unspecified: Secondary | ICD-10-CM | POA: Diagnosis not present

## 2015-08-05 DIAGNOSIS — C799 Secondary malignant neoplasm of unspecified site: Secondary | ICD-10-CM

## 2015-08-05 DIAGNOSIS — R634 Abnormal weight loss: Secondary | ICD-10-CM | POA: Diagnosis not present

## 2015-08-05 DIAGNOSIS — C049 Malignant neoplasm of floor of mouth, unspecified: Secondary | ICD-10-CM | POA: Diagnosis not present

## 2015-08-05 DIAGNOSIS — I10 Essential (primary) hypertension: Secondary | ICD-10-CM | POA: Diagnosis not present

## 2015-08-05 DIAGNOSIS — Z5111 Encounter for antineoplastic chemotherapy: Secondary | ICD-10-CM | POA: Diagnosis not present

## 2015-08-05 DIAGNOSIS — F17210 Nicotine dependence, cigarettes, uncomplicated: Secondary | ICD-10-CM | POA: Diagnosis not present

## 2015-08-05 DIAGNOSIS — IMO0002 Reserved for concepts with insufficient information to code with codable children: Secondary | ICD-10-CM

## 2015-08-05 DIAGNOSIS — E039 Hypothyroidism, unspecified: Secondary | ICD-10-CM | POA: Diagnosis not present

## 2015-08-05 DIAGNOSIS — C770 Secondary and unspecified malignant neoplasm of lymph nodes of head, face and neck: Secondary | ICD-10-CM | POA: Diagnosis not present

## 2015-08-05 DIAGNOSIS — D649 Anemia, unspecified: Secondary | ICD-10-CM | POA: Diagnosis not present

## 2015-08-05 LAB — CBC WITH DIFFERENTIAL/PLATELET
BASOS PCT: 0 %
Basophils Absolute: 0 10*3/uL (ref 0–0.1)
EOS ABS: 0.2 10*3/uL (ref 0–0.7)
Eosinophils Relative: 2 %
HCT: 32.6 % — ABNORMAL LOW (ref 35.0–47.0)
HEMOGLOBIN: 11.4 g/dL — AB (ref 12.0–16.0)
LYMPHS ABS: 1.4 10*3/uL (ref 1.0–3.6)
Lymphocytes Relative: 12 %
MCH: 32.2 pg (ref 26.0–34.0)
MCHC: 35.1 g/dL (ref 32.0–36.0)
MCV: 91.8 fL (ref 80.0–100.0)
Monocytes Absolute: 0.9 10*3/uL (ref 0.2–0.9)
Monocytes Relative: 8 %
NEUTROS ABS: 8.9 10*3/uL — AB (ref 1.4–6.5)
NEUTROS PCT: 78 %
Platelets: 194 10*3/uL (ref 150–440)
RBC: 3.55 MIL/uL — AB (ref 3.80–5.20)
RDW: 13.2 % (ref 11.5–14.5)
WBC: 11.5 10*3/uL — AB (ref 3.6–11.0)

## 2015-08-05 LAB — COMPREHENSIVE METABOLIC PANEL
ALT: 17 U/L (ref 14–54)
ANION GAP: 7 (ref 5–15)
AST: 16 U/L (ref 15–41)
Albumin: 3.7 g/dL (ref 3.5–5.0)
Alkaline Phosphatase: 62 U/L (ref 38–126)
BUN: 13 mg/dL (ref 6–20)
CHLORIDE: 98 mmol/L — AB (ref 101–111)
CO2: 24 mmol/L (ref 22–32)
CREATININE: 0.69 mg/dL (ref 0.44–1.00)
Calcium: 8.7 mg/dL — ABNORMAL LOW (ref 8.9–10.3)
Glucose, Bld: 94 mg/dL (ref 65–99)
Potassium: 4 mmol/L (ref 3.5–5.1)
Sodium: 129 mmol/L — ABNORMAL LOW (ref 135–145)
Total Bilirubin: 0.9 mg/dL (ref 0.3–1.2)
Total Protein: 6.6 g/dL (ref 6.5–8.1)

## 2015-08-05 LAB — MAGNESIUM: MAGNESIUM: 1.8 mg/dL (ref 1.7–2.4)

## 2015-08-05 MED ORDER — SODIUM CHLORIDE 0.9 % IV SOLN
Freq: Once | INTRAVENOUS | Status: AC
  Administered 2015-08-05: 12:00:00 via INTRAVENOUS
  Filled 2015-08-05: qty 5

## 2015-08-05 MED ORDER — HEPARIN SOD (PORK) LOCK FLUSH 100 UNIT/ML IV SOLN
500.0000 [IU] | Freq: Once | INTRAVENOUS | Status: AC
  Administered 2015-08-05: 500 [IU] via INTRAVENOUS
  Filled 2015-08-05: qty 5

## 2015-08-05 MED ORDER — SODIUM CHLORIDE 0.9 % IV SOLN
40.0000 mg/m2 | Freq: Once | INTRAVENOUS | Status: AC
  Administered 2015-08-05: 55 mg via INTRAVENOUS
  Filled 2015-08-05: qty 55

## 2015-08-05 MED ORDER — SODIUM CHLORIDE 0.9 % IV SOLN
Freq: Once | INTRAVENOUS | Status: AC
  Administered 2015-08-05: 10:00:00 via INTRAVENOUS
  Filled 2015-08-05: qty 1000

## 2015-08-05 MED ORDER — SODIUM CHLORIDE 0.9% FLUSH
10.0000 mL | INTRAVENOUS | Status: DC | PRN
  Administered 2015-08-05: 10 mL via INTRAVENOUS
  Filled 2015-08-05: qty 10

## 2015-08-05 MED ORDER — PALONOSETRON HCL INJECTION 0.25 MG/5ML
0.2500 mg | Freq: Once | INTRAVENOUS | Status: AC
  Administered 2015-08-05: 0.25 mg via INTRAVENOUS
  Filled 2015-08-05: qty 5

## 2015-08-05 MED ORDER — DEXTROSE-NACL 5-0.45 % IV SOLN
Freq: Once | INTRAVENOUS | Status: AC
  Administered 2015-08-05: 10:00:00 via INTRAVENOUS
  Filled 2015-08-05: qty 10

## 2015-08-06 ENCOUNTER — Ambulatory Visit
Admission: RE | Admit: 2015-08-06 | Discharge: 2015-08-06 | Disposition: A | Discharge status: Home / Self Care | Payer: Commercial Managed Care - HMO | Source: Ambulatory Visit | Attending: Radiation Oncology | Admitting: Radiation Oncology

## 2015-08-06 ENCOUNTER — Ambulatory Visit: Payer: Commercial Managed Care - HMO

## 2015-08-06 DIAGNOSIS — E039 Hypothyroidism, unspecified: Secondary | ICD-10-CM | POA: Diagnosis not present

## 2015-08-06 DIAGNOSIS — R634 Abnormal weight loss: Secondary | ICD-10-CM | POA: Diagnosis not present

## 2015-08-06 DIAGNOSIS — R252 Cramp and spasm: Secondary | ICD-10-CM | POA: Diagnosis not present

## 2015-08-06 DIAGNOSIS — E785 Hyperlipidemia, unspecified: Secondary | ICD-10-CM | POA: Diagnosis not present

## 2015-08-06 DIAGNOSIS — I10 Essential (primary) hypertension: Secondary | ICD-10-CM | POA: Diagnosis not present

## 2015-08-06 DIAGNOSIS — F17210 Nicotine dependence, cigarettes, uncomplicated: Secondary | ICD-10-CM | POA: Diagnosis not present

## 2015-08-06 DIAGNOSIS — C049 Malignant neoplasm of floor of mouth, unspecified: Secondary | ICD-10-CM | POA: Diagnosis not present

## 2015-08-06 DIAGNOSIS — Z510 Encounter for antineoplastic radiation therapy: Secondary | ICD-10-CM | POA: Diagnosis not present

## 2015-08-06 DIAGNOSIS — C770 Secondary and unspecified malignant neoplasm of lymph nodes of head, face and neck: Secondary | ICD-10-CM | POA: Diagnosis not present

## 2015-08-06 DIAGNOSIS — C040 Malignant neoplasm of anterior floor of mouth: Secondary | ICD-10-CM | POA: Diagnosis not present

## 2015-08-07 ENCOUNTER — Ambulatory Visit
Admission: RE | Admit: 2015-08-07 | Discharge: 2015-08-07 | Disposition: A | Discharge status: Home / Self Care | Payer: Commercial Managed Care - HMO | Source: Ambulatory Visit | Attending: Radiation Oncology | Admitting: Radiation Oncology

## 2015-08-07 ENCOUNTER — Ambulatory Visit: Payer: Commercial Managed Care - HMO

## 2015-08-07 DIAGNOSIS — I10 Essential (primary) hypertension: Secondary | ICD-10-CM | POA: Diagnosis not present

## 2015-08-07 DIAGNOSIS — C040 Malignant neoplasm of anterior floor of mouth: Secondary | ICD-10-CM | POA: Diagnosis not present

## 2015-08-07 DIAGNOSIS — C049 Malignant neoplasm of floor of mouth, unspecified: Secondary | ICD-10-CM | POA: Diagnosis not present

## 2015-08-07 DIAGNOSIS — C770 Secondary and unspecified malignant neoplasm of lymph nodes of head, face and neck: Secondary | ICD-10-CM | POA: Diagnosis not present

## 2015-08-07 DIAGNOSIS — R634 Abnormal weight loss: Secondary | ICD-10-CM | POA: Diagnosis not present

## 2015-08-07 DIAGNOSIS — E039 Hypothyroidism, unspecified: Secondary | ICD-10-CM | POA: Diagnosis not present

## 2015-08-07 DIAGNOSIS — F17210 Nicotine dependence, cigarettes, uncomplicated: Secondary | ICD-10-CM | POA: Diagnosis not present

## 2015-08-07 DIAGNOSIS — Z510 Encounter for antineoplastic radiation therapy: Secondary | ICD-10-CM | POA: Diagnosis not present

## 2015-08-07 DIAGNOSIS — R252 Cramp and spasm: Secondary | ICD-10-CM | POA: Diagnosis not present

## 2015-08-07 DIAGNOSIS — E785 Hyperlipidemia, unspecified: Secondary | ICD-10-CM | POA: Diagnosis not present

## 2015-08-08 ENCOUNTER — Ambulatory Visit
Admission: RE | Admit: 2015-08-08 | Discharge: 2015-08-08 | Disposition: A | Discharge status: Home / Self Care | Payer: Commercial Managed Care - HMO | Source: Ambulatory Visit | Attending: Radiation Oncology | Admitting: Radiation Oncology

## 2015-08-08 ENCOUNTER — Ambulatory Visit: Payer: Commercial Managed Care - HMO

## 2015-08-08 DIAGNOSIS — C770 Secondary and unspecified malignant neoplasm of lymph nodes of head, face and neck: Secondary | ICD-10-CM | POA: Diagnosis not present

## 2015-08-08 DIAGNOSIS — C040 Malignant neoplasm of anterior floor of mouth: Secondary | ICD-10-CM | POA: Diagnosis not present

## 2015-08-08 DIAGNOSIS — F17210 Nicotine dependence, cigarettes, uncomplicated: Secondary | ICD-10-CM | POA: Diagnosis not present

## 2015-08-08 DIAGNOSIS — E039 Hypothyroidism, unspecified: Secondary | ICD-10-CM | POA: Diagnosis not present

## 2015-08-08 DIAGNOSIS — Z510 Encounter for antineoplastic radiation therapy: Secondary | ICD-10-CM | POA: Diagnosis not present

## 2015-08-08 DIAGNOSIS — R252 Cramp and spasm: Secondary | ICD-10-CM | POA: Diagnosis not present

## 2015-08-08 DIAGNOSIS — R634 Abnormal weight loss: Secondary | ICD-10-CM | POA: Diagnosis not present

## 2015-08-08 DIAGNOSIS — E785 Hyperlipidemia, unspecified: Secondary | ICD-10-CM | POA: Diagnosis not present

## 2015-08-08 DIAGNOSIS — I10 Essential (primary) hypertension: Secondary | ICD-10-CM | POA: Diagnosis not present

## 2015-08-08 DIAGNOSIS — C049 Malignant neoplasm of floor of mouth, unspecified: Secondary | ICD-10-CM | POA: Diagnosis not present

## 2015-08-09 ENCOUNTER — Ambulatory Visit: Payer: Commercial Managed Care - HMO

## 2015-08-09 ENCOUNTER — Ambulatory Visit
Admission: RE | Admit: 2015-08-09 | Discharge: 2015-08-09 | Disposition: A | Discharge status: Home / Self Care | Payer: Commercial Managed Care - HMO | Source: Ambulatory Visit | Attending: Radiation Oncology | Admitting: Radiation Oncology

## 2015-08-09 DIAGNOSIS — C049 Malignant neoplasm of floor of mouth, unspecified: Secondary | ICD-10-CM | POA: Diagnosis not present

## 2015-08-09 DIAGNOSIS — C770 Secondary and unspecified malignant neoplasm of lymph nodes of head, face and neck: Secondary | ICD-10-CM | POA: Diagnosis not present

## 2015-08-09 DIAGNOSIS — C040 Malignant neoplasm of anterior floor of mouth: Secondary | ICD-10-CM | POA: Diagnosis not present

## 2015-08-09 DIAGNOSIS — R634 Abnormal weight loss: Secondary | ICD-10-CM | POA: Diagnosis not present

## 2015-08-09 DIAGNOSIS — R252 Cramp and spasm: Secondary | ICD-10-CM | POA: Diagnosis not present

## 2015-08-09 DIAGNOSIS — I10 Essential (primary) hypertension: Secondary | ICD-10-CM | POA: Diagnosis not present

## 2015-08-09 DIAGNOSIS — E039 Hypothyroidism, unspecified: Secondary | ICD-10-CM | POA: Diagnosis not present

## 2015-08-09 DIAGNOSIS — Z510 Encounter for antineoplastic radiation therapy: Secondary | ICD-10-CM | POA: Diagnosis not present

## 2015-08-09 DIAGNOSIS — F17210 Nicotine dependence, cigarettes, uncomplicated: Secondary | ICD-10-CM | POA: Diagnosis not present

## 2015-08-09 DIAGNOSIS — E785 Hyperlipidemia, unspecified: Secondary | ICD-10-CM | POA: Diagnosis not present

## 2015-08-13 ENCOUNTER — Ambulatory Visit
Admission: RE | Admit: 2015-08-13 | Discharge: 2015-08-13 | Disposition: A | Discharge status: Home / Self Care | Payer: Commercial Managed Care - HMO | Source: Ambulatory Visit | Attending: Radiation Oncology | Admitting: Radiation Oncology

## 2015-08-13 ENCOUNTER — Ambulatory Visit: Payer: Commercial Managed Care - HMO

## 2015-08-13 DIAGNOSIS — C040 Malignant neoplasm of anterior floor of mouth: Secondary | ICD-10-CM | POA: Diagnosis not present

## 2015-08-13 DIAGNOSIS — F17210 Nicotine dependence, cigarettes, uncomplicated: Secondary | ICD-10-CM | POA: Diagnosis not present

## 2015-08-13 DIAGNOSIS — C770 Secondary and unspecified malignant neoplasm of lymph nodes of head, face and neck: Secondary | ICD-10-CM | POA: Diagnosis not present

## 2015-08-13 DIAGNOSIS — C049 Malignant neoplasm of floor of mouth, unspecified: Secondary | ICD-10-CM | POA: Diagnosis not present

## 2015-08-13 DIAGNOSIS — E785 Hyperlipidemia, unspecified: Secondary | ICD-10-CM | POA: Diagnosis not present

## 2015-08-13 DIAGNOSIS — R252 Cramp and spasm: Secondary | ICD-10-CM | POA: Diagnosis not present

## 2015-08-13 DIAGNOSIS — Z510 Encounter for antineoplastic radiation therapy: Secondary | ICD-10-CM | POA: Diagnosis not present

## 2015-08-13 DIAGNOSIS — R634 Abnormal weight loss: Secondary | ICD-10-CM | POA: Diagnosis not present

## 2015-08-13 DIAGNOSIS — E039 Hypothyroidism, unspecified: Secondary | ICD-10-CM | POA: Diagnosis not present

## 2015-08-13 DIAGNOSIS — I10 Essential (primary) hypertension: Secondary | ICD-10-CM | POA: Diagnosis not present

## 2015-08-14 ENCOUNTER — Inpatient Hospital Stay (HOSPITAL_BASED_OUTPATIENT_CLINIC_OR_DEPARTMENT_OTHER): Payer: Commercial Managed Care - HMO | Admitting: Internal Medicine

## 2015-08-14 ENCOUNTER — Inpatient Hospital Stay: Payer: Commercial Managed Care - HMO

## 2015-08-14 ENCOUNTER — Ambulatory Visit
Admission: RE | Admit: 2015-08-14 | Discharge: 2015-08-14 | Disposition: A | Discharge status: Home / Self Care | Payer: Commercial Managed Care - HMO | Source: Ambulatory Visit | Attending: Radiation Oncology | Admitting: Radiation Oncology

## 2015-08-14 ENCOUNTER — Ambulatory Visit: Payer: Commercial Managed Care - HMO

## 2015-08-14 DIAGNOSIS — R252 Cramp and spasm: Secondary | ICD-10-CM | POA: Diagnosis not present

## 2015-08-14 DIAGNOSIS — F17210 Nicotine dependence, cigarettes, uncomplicated: Secondary | ICD-10-CM | POA: Diagnosis not present

## 2015-08-14 DIAGNOSIS — C01 Malignant neoplasm of base of tongue: Secondary | ICD-10-CM | POA: Diagnosis not present

## 2015-08-14 DIAGNOSIS — C049 Malignant neoplasm of floor of mouth, unspecified: Secondary | ICD-10-CM

## 2015-08-14 DIAGNOSIS — IMO0002 Reserved for concepts with insufficient information to code with codable children: Secondary | ICD-10-CM

## 2015-08-14 DIAGNOSIS — Z5111 Encounter for antineoplastic chemotherapy: Secondary | ICD-10-CM | POA: Diagnosis not present

## 2015-08-14 DIAGNOSIS — R634 Abnormal weight loss: Secondary | ICD-10-CM

## 2015-08-14 DIAGNOSIS — Z79899 Other long term (current) drug therapy: Secondary | ICD-10-CM | POA: Diagnosis not present

## 2015-08-14 DIAGNOSIS — D649 Anemia, unspecified: Secondary | ICD-10-CM | POA: Diagnosis not present

## 2015-08-14 DIAGNOSIS — E039 Hypothyroidism, unspecified: Secondary | ICD-10-CM | POA: Diagnosis not present

## 2015-08-14 DIAGNOSIS — I10 Essential (primary) hypertension: Secondary | ICD-10-CM | POA: Diagnosis not present

## 2015-08-14 DIAGNOSIS — C040 Malignant neoplasm of anterior floor of mouth: Secondary | ICD-10-CM | POA: Diagnosis not present

## 2015-08-14 DIAGNOSIS — R200 Anesthesia of skin: Secondary | ICD-10-CM

## 2015-08-14 DIAGNOSIS — Z510 Encounter for antineoplastic radiation therapy: Secondary | ICD-10-CM | POA: Diagnosis not present

## 2015-08-14 DIAGNOSIS — E785 Hyperlipidemia, unspecified: Secondary | ICD-10-CM | POA: Diagnosis not present

## 2015-08-14 DIAGNOSIS — C770 Secondary and unspecified malignant neoplasm of lymph nodes of head, face and neck: Secondary | ICD-10-CM | POA: Diagnosis not present

## 2015-08-14 DIAGNOSIS — C799 Secondary malignant neoplasm of unspecified site: Secondary | ICD-10-CM

## 2015-08-14 LAB — CBC WITH DIFFERENTIAL/PLATELET
Basophils Absolute: 0 10*3/uL (ref 0–0.1)
Basophils Relative: 1 %
EOS ABS: 0.1 10*3/uL (ref 0–0.7)
EOS PCT: 1 %
HCT: 29.2 % — ABNORMAL LOW (ref 35.0–47.0)
Hemoglobin: 10.5 g/dL — ABNORMAL LOW (ref 12.0–16.0)
LYMPHS ABS: 0.9 10*3/uL — AB (ref 1.0–3.6)
Lymphocytes Relative: 17 %
MCH: 32.7 pg (ref 26.0–34.0)
MCHC: 35.8 g/dL (ref 32.0–36.0)
MCV: 91.4 fL (ref 80.0–100.0)
MONO ABS: 0.6 10*3/uL (ref 0.2–0.9)
MONOS PCT: 11 %
NEUTROS PCT: 70 %
Neutro Abs: 3.6 10*3/uL (ref 1.4–6.5)
PLATELETS: 167 10*3/uL (ref 150–440)
RBC: 3.19 MIL/uL — ABNORMAL LOW (ref 3.80–5.20)
RDW: 13.2 % (ref 11.5–14.5)
WBC: 5.1 10*3/uL (ref 3.6–11.0)

## 2015-08-14 LAB — COMPREHENSIVE METABOLIC PANEL
ALT: 18 U/L (ref 14–54)
ANION GAP: 7 (ref 5–15)
AST: 16 U/L (ref 15–41)
Albumin: 3.6 g/dL (ref 3.5–5.0)
Alkaline Phosphatase: 72 U/L (ref 38–126)
BUN: 12 mg/dL (ref 6–20)
CHLORIDE: 101 mmol/L (ref 101–111)
CO2: 24 mmol/L (ref 22–32)
CREATININE: 0.71 mg/dL (ref 0.44–1.00)
Calcium: 8.6 mg/dL — ABNORMAL LOW (ref 8.9–10.3)
Glucose, Bld: 108 mg/dL — ABNORMAL HIGH (ref 65–99)
POTASSIUM: 3.9 mmol/L (ref 3.5–5.1)
SODIUM: 132 mmol/L — AB (ref 135–145)
Total Bilirubin: 0.4 mg/dL (ref 0.3–1.2)
Total Protein: 6.4 g/dL — ABNORMAL LOW (ref 6.5–8.1)

## 2015-08-14 LAB — MAGNESIUM: MAGNESIUM: 1.8 mg/dL (ref 1.7–2.4)

## 2015-08-14 MED ORDER — PALONOSETRON HCL INJECTION 0.25 MG/5ML
0.2500 mg | Freq: Once | INTRAVENOUS | Status: AC
  Administered 2015-08-14: 0.25 mg via INTRAVENOUS
  Filled 2015-08-14: qty 5

## 2015-08-14 MED ORDER — HEPARIN SOD (PORK) LOCK FLUSH 100 UNIT/ML IV SOLN
500.0000 [IU] | Freq: Once | INTRAVENOUS | Status: DC | PRN
  Filled 2015-08-14: qty 5

## 2015-08-14 MED ORDER — DEXTROSE-NACL 5-0.45 % IV SOLN
Freq: Once | INTRAVENOUS | Status: AC
  Administered 2015-08-14: 10:00:00 via INTRAVENOUS
  Filled 2015-08-14: qty 1000

## 2015-08-14 MED ORDER — HEPARIN SOD (PORK) LOCK FLUSH 100 UNIT/ML IV SOLN
500.0000 [IU] | Freq: Once | INTRAVENOUS | Status: AC
  Administered 2015-08-14: 500 [IU] via INTRAVENOUS

## 2015-08-14 MED ORDER — SODIUM CHLORIDE 0.9 % IV SOLN
40.0000 mg/m2 | Freq: Once | INTRAVENOUS | Status: AC
  Administered 2015-08-14: 55 mg via INTRAVENOUS
  Filled 2015-08-14: qty 55

## 2015-08-14 MED ORDER — SODIUM CHLORIDE 0.9 % IV SOLN
Freq: Once | INTRAVENOUS | Status: AC
  Administered 2015-08-14: 12:00:00 via INTRAVENOUS
  Filled 2015-08-14: qty 5

## 2015-08-14 MED ORDER — SODIUM CHLORIDE 0.9% FLUSH
10.0000 mL | INTRAVENOUS | Status: DC | PRN
  Administered 2015-08-14: 10 mL via INTRAVENOUS
  Filled 2015-08-14: qty 10

## 2015-08-14 MED ORDER — SODIUM CHLORIDE 0.9 % IV SOLN
Freq: Once | INTRAVENOUS | Status: AC
  Administered 2015-08-14: 10:00:00 via INTRAVENOUS
  Filled 2015-08-14: qty 1000

## 2015-08-14 NOTE — Progress Notes (Signed)
[facility] NOTE  Patient Care Team: Tracie Harrier, MD as PCP - General (Internal Medicine) Noreene Filbert, MD as Referring Physician (Radiation Oncology) Algernon Huxley, MD as Referring Physician (Vascular Surgery)  CHIEF COMPLAINTS/PURPOSE OF CONSULTATION:   # April 2017-[facility]CC [STAGE IVA T4N2; Dr.McQueen; Left floor of mouth -Bx; Left neck LN-FNA+] left tongue/ floor of mouth- 4 x 3 x 2 cm.; 4.3 x 3.3 x 3 cm complex mass extends through the left mylohyoid muscle below the left mandible anterior to left submandibular gland] - START Cis [5/15]- weekly with RT [5/17]  # Smoker # weight loss  HISTORY OF PRESENTING ILLNESS:  Rebecca Mcmillan 70 y.o.  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 Patient has finished 2 weekly treatments of cisplatin along with radiation. No significant improvement of the left mandibular mass noted.  Patient has mild nausea mild fatigue postchemotherapy. She does complain of poor appetite positive or weight loss.   She noted to have tingling and numbness of her right lower extremity few days after the chemotherapy.  She denies any pain. Denies any difficulty swallowing pain with swallowing or any hoarseness of voice. Denies any shortness of breath or chest pain.   ROS: A complete 10 point review of system is done which is negative except mentioned above in history of present illness  MEDICAL HISTORY:  Past Medical History  Diagnosis Date  . Hypertension   . Thyroid disease   . Hypothyroidism   . Hyperlipidemia     SURGICAL HISTORY: None  SOCIAL HISTORY: Patient previously used to work in nursing homes. Denies any alcohol. Smokes a pack of Celexa day for 45 years lives with her husband in [place]. No children.  Social History   Social History  . Marital Status: Married    Spouse Name: N/A  . Number of  Children: N/A  . Years of Education: N/A   Occupational History  . Not on file.   Social History Main Topics  . Smoking status: Former Smoker -- 1.00 packs/day for 45 years    Types: Cigarettes    Quit date: 06/19/2015  . Smokeless tobacco: Never Used  . Alcohol Use: No  . Drug Use: No  . Sexual Activity: Not on file   Other Topics Concern  . Not on file   Social History Narrative    FAMILY HISTORY:no family history of head and neck cancer.   ALLERGIES:  is allergic to garlic; lactose intolerance (gi); and tetracyclines & related.  MEDICATIONS:  Current Outpatient Prescriptions  Medication Sig Dispense Refill  . atenolol (TENORMIN) 25 MG tablet Take 25 mg by mouth daily.     Marland Kitchen levothyroxine (SYNTHROID, LEVOTHROID) 50 MCG tablet     . lidocaine-prilocaine (EMLA) cream Apply 1 application topically as needed. 30 g 6  . losartan (COZAAR) 100 MG tablet     . lovastatin (MEVACOR) 40 MG tablet Take 40 mg by mouth every evening.     . ondansetron (ZOFRAN) 8 MG tablet TAKE 1 TABLET EVERY 8 HOURS AS NEEDED FOR NAUSEA AND VOMITING (START 3 DAYS AFTER CHEMOTHERAPY) 40 tablet 0  . oxyCODONE-acetaminophen (ROXICET) 5-325 MG tablet Take 1-2 tablets by mouth every 4 (four) hours as needed for severe pain. 40 tablet 0  . prochlorperazine (COMPAZINE) 10 MG tablet TAKE 1 TABLET EVERY 6 HOURS AS NEEDED FOR NAUSEA AND VOMITING 30 tablet 0  Current Facility-Administered Medications  Medication Dose Route Frequency Provider Last Rate Last Dose  . heparin lock flush 100 unit/mL  500 Units Intravenous Once Cammie Sickle, MD      . sodium chloride flush (NS) 0.9 % injection 10 mL  10 mL Intravenous PRN Cammie Sickle, MD   10 mL at 08/14/15 0850      .  PHYSICAL EXAMINATION: ECOG PERFORMANCE STATUS: 1 - Symptomatic but completely ambulatory  Filed Vitals:   08/14/15 0917  BP: 156/84  Pulse: 56  Temp: 95.6 F (35.3 C)  Resp: 18   Filed Weights   08/14/15 0917  Weight:  88 lb 6.5 oz (40.1 kg)    GENERAL: Thin built moderately nourished Caucasian female patient anxious. Alert, no distress and comfortable. She is Accompanied by her husband.  EYES: no pallor or icterus OROPHARYNX: no thrush or ulceration; poor dentition. No obvious masses noted in the oral cavity. NECK: supple, 5-6cm mass/firm- submandibular mass. Non-tender.  LYMPH:  no palpable lymphadenopathy xillary or inguinal regions LUNGS: Decreased breath sounds bilaterally No wheeze or crackles; barrel chested.  HEART/CVS: regular rate & rhythm and no murmurs; No lower extremity edema ABDOMEN: abdomen soft, non-tender and normal bowel sounds Musculoskeletal:no cyanosis of digits and no clubbing  PSYCH: alert & oriented x 3 with fluent speech NEURO: no focal motor/sensory deficits SKIN:  no rashes or significant lesions  LABORATORY DATA:  I have reviewed the data as listed Lab Results  Component Value Date   WBC 5.1 08/14/2015   HGB 10.5* 08/14/2015   HCT 29.2* 08/14/2015   MCV 91.4 08/14/2015   PLT 167 08/14/2015    Recent Labs  07/29/15 0815 08/05/15 0827 08/14/15 0838  NA 134* 129* 132*  K 3.9 4.0 3.9  CL 104 98* 101  CO2 24 24 24   GLUCOSE 162* 94 108*  BUN 17 13 12   CREATININE 0.81 0.69 0.71  CALCIUM 8.8* 8.7* 8.6*  GFRNONAA >60 >60 >60  GFRAA >60 >60 >60  PROT 7.1 6.6 6.4*  ALBUMIN 4.2 3.7 3.6  AST 23 16 16   ALT 14 17 18   ALKPHOS 73 62 72  BILITOT 0.7 0.9 0.4    ASSESSMENT & PLAN:   # 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 Unresectable- Currently on concurrent chemoradiation status post 2 cycles of chemotherapy. No significant clinical improvement noted.   # Proceed with cycle #3 of cisplatin along with radiation. CBC CMP unremarkable except for mild anemia hemoglobin 10 and mild hyponatremia sodium 132.  # Nutrition/weight loss- secondary to poor taste; recommend nutrition evaluation. Recommend 2-3 post  once a day. Patient has previously declined PEG tube.  # patient will get weekly CBC BMP and magnesium/cisplatin; follow-up with me in 2 weeks.    Cammie Sickle, MD 08/14/2015 9:27 AM

## 2015-08-14 NOTE — Progress Notes (Signed)
Dx: Mouth cancer. Persistent wt loss; decreased appetite.  Dietary referral initiated.

## 2015-08-14 NOTE — Progress Notes (Signed)
Pt has had vascular issues on left leg and has issues in right leg too. Too much sitting, too much walking hurts her and she has to rest. She does better lying down with discomfort. Eating better but no taste buds. Bowels are working good

## 2015-08-15 ENCOUNTER — Ambulatory Visit: Payer: Commercial Managed Care - HMO

## 2015-08-15 ENCOUNTER — Ambulatory Visit
Admission: RE | Admit: 2015-08-15 | Discharge: 2015-08-15 | Disposition: A | Discharge status: Home / Self Care | Payer: Commercial Managed Care - HMO | Source: Ambulatory Visit | Attending: Radiation Oncology | Admitting: Radiation Oncology

## 2015-08-15 DIAGNOSIS — I10 Essential (primary) hypertension: Secondary | ICD-10-CM | POA: Diagnosis not present

## 2015-08-15 DIAGNOSIS — R252 Cramp and spasm: Secondary | ICD-10-CM | POA: Diagnosis not present

## 2015-08-15 DIAGNOSIS — Z510 Encounter for antineoplastic radiation therapy: Secondary | ICD-10-CM | POA: Diagnosis not present

## 2015-08-15 DIAGNOSIS — C040 Malignant neoplasm of anterior floor of mouth: Secondary | ICD-10-CM | POA: Diagnosis not present

## 2015-08-15 DIAGNOSIS — C049 Malignant neoplasm of floor of mouth, unspecified: Secondary | ICD-10-CM | POA: Diagnosis not present

## 2015-08-15 DIAGNOSIS — F17210 Nicotine dependence, cigarettes, uncomplicated: Secondary | ICD-10-CM | POA: Diagnosis not present

## 2015-08-15 DIAGNOSIS — C770 Secondary and unspecified malignant neoplasm of lymph nodes of head, face and neck: Secondary | ICD-10-CM | POA: Diagnosis not present

## 2015-08-15 DIAGNOSIS — R634 Abnormal weight loss: Secondary | ICD-10-CM | POA: Diagnosis not present

## 2015-08-15 DIAGNOSIS — E039 Hypothyroidism, unspecified: Secondary | ICD-10-CM | POA: Diagnosis not present

## 2015-08-15 DIAGNOSIS — E785 Hyperlipidemia, unspecified: Secondary | ICD-10-CM | POA: Diagnosis not present

## 2015-08-16 ENCOUNTER — Ambulatory Visit: Payer: Commercial Managed Care - HMO

## 2015-08-16 ENCOUNTER — Ambulatory Visit
Admission: RE | Admit: 2015-08-16 | Discharge: 2015-08-16 | Disposition: A | Discharge status: Home / Self Care | Payer: Commercial Managed Care - HMO | Source: Ambulatory Visit | Attending: Radiation Oncology | Admitting: Radiation Oncology

## 2015-08-16 ENCOUNTER — Other Ambulatory Visit: Payer: Self-pay | Admitting: Internal Medicine

## 2015-08-16 DIAGNOSIS — C770 Secondary and unspecified malignant neoplasm of lymph nodes of head, face and neck: Secondary | ICD-10-CM | POA: Diagnosis not present

## 2015-08-16 DIAGNOSIS — C049 Malignant neoplasm of floor of mouth, unspecified: Secondary | ICD-10-CM | POA: Diagnosis not present

## 2015-08-16 DIAGNOSIS — E039 Hypothyroidism, unspecified: Secondary | ICD-10-CM | POA: Diagnosis not present

## 2015-08-16 DIAGNOSIS — E785 Hyperlipidemia, unspecified: Secondary | ICD-10-CM | POA: Diagnosis not present

## 2015-08-16 DIAGNOSIS — R634 Abnormal weight loss: Secondary | ICD-10-CM | POA: Diagnosis not present

## 2015-08-16 DIAGNOSIS — F17210 Nicotine dependence, cigarettes, uncomplicated: Secondary | ICD-10-CM | POA: Diagnosis not present

## 2015-08-16 DIAGNOSIS — T451X5A Adverse effect of antineoplastic and immunosuppressive drugs, initial encounter: Principal | ICD-10-CM

## 2015-08-16 DIAGNOSIS — R252 Cramp and spasm: Secondary | ICD-10-CM | POA: Diagnosis not present

## 2015-08-16 DIAGNOSIS — Z510 Encounter for antineoplastic radiation therapy: Secondary | ICD-10-CM | POA: Diagnosis not present

## 2015-08-16 DIAGNOSIS — C040 Malignant neoplasm of anterior floor of mouth: Secondary | ICD-10-CM | POA: Diagnosis not present

## 2015-08-16 DIAGNOSIS — I10 Essential (primary) hypertension: Secondary | ICD-10-CM | POA: Diagnosis not present

## 2015-08-16 DIAGNOSIS — R112 Nausea with vomiting, unspecified: Secondary | ICD-10-CM

## 2015-08-19 ENCOUNTER — Ambulatory Visit: Payer: Commercial Managed Care - HMO

## 2015-08-19 ENCOUNTER — Ambulatory Visit
Admission: RE | Admit: 2015-08-19 | Discharge: 2015-08-19 | Disposition: A | Discharge status: Home / Self Care | Payer: Commercial Managed Care - HMO | Source: Ambulatory Visit | Attending: Radiation Oncology | Admitting: Radiation Oncology

## 2015-08-19 DIAGNOSIS — C040 Malignant neoplasm of anterior floor of mouth: Secondary | ICD-10-CM | POA: Diagnosis not present

## 2015-08-19 DIAGNOSIS — Z510 Encounter for antineoplastic radiation therapy: Secondary | ICD-10-CM | POA: Diagnosis not present

## 2015-08-19 DIAGNOSIS — E039 Hypothyroidism, unspecified: Secondary | ICD-10-CM | POA: Diagnosis not present

## 2015-08-19 DIAGNOSIS — E785 Hyperlipidemia, unspecified: Secondary | ICD-10-CM | POA: Diagnosis not present

## 2015-08-19 DIAGNOSIS — C049 Malignant neoplasm of floor of mouth, unspecified: Secondary | ICD-10-CM | POA: Diagnosis not present

## 2015-08-19 DIAGNOSIS — C770 Secondary and unspecified malignant neoplasm of lymph nodes of head, face and neck: Secondary | ICD-10-CM | POA: Diagnosis not present

## 2015-08-19 DIAGNOSIS — R634 Abnormal weight loss: Secondary | ICD-10-CM | POA: Diagnosis not present

## 2015-08-19 DIAGNOSIS — F17210 Nicotine dependence, cigarettes, uncomplicated: Secondary | ICD-10-CM | POA: Diagnosis not present

## 2015-08-19 DIAGNOSIS — I10 Essential (primary) hypertension: Secondary | ICD-10-CM | POA: Diagnosis not present

## 2015-08-19 DIAGNOSIS — R252 Cramp and spasm: Secondary | ICD-10-CM | POA: Diagnosis not present

## 2015-08-20 ENCOUNTER — Ambulatory Visit
Admission: RE | Admit: 2015-08-20 | Discharge: 2015-08-20 | Disposition: A | Discharge status: Home / Self Care | Payer: Commercial Managed Care - HMO | Source: Ambulatory Visit | Attending: Radiation Oncology | Admitting: Radiation Oncology

## 2015-08-20 ENCOUNTER — Ambulatory Visit: Payer: Commercial Managed Care - HMO

## 2015-08-20 ENCOUNTER — Other Ambulatory Visit: Payer: Self-pay | Admitting: *Deleted

## 2015-08-20 DIAGNOSIS — R252 Cramp and spasm: Secondary | ICD-10-CM | POA: Diagnosis not present

## 2015-08-20 DIAGNOSIS — C040 Malignant neoplasm of anterior floor of mouth: Secondary | ICD-10-CM | POA: Diagnosis not present

## 2015-08-20 DIAGNOSIS — E785 Hyperlipidemia, unspecified: Secondary | ICD-10-CM | POA: Diagnosis not present

## 2015-08-20 DIAGNOSIS — R634 Abnormal weight loss: Secondary | ICD-10-CM | POA: Diagnosis not present

## 2015-08-20 DIAGNOSIS — F17210 Nicotine dependence, cigarettes, uncomplicated: Secondary | ICD-10-CM | POA: Diagnosis not present

## 2015-08-20 DIAGNOSIS — C770 Secondary and unspecified malignant neoplasm of lymph nodes of head, face and neck: Secondary | ICD-10-CM | POA: Diagnosis not present

## 2015-08-20 DIAGNOSIS — I10 Essential (primary) hypertension: Secondary | ICD-10-CM | POA: Diagnosis not present

## 2015-08-20 DIAGNOSIS — E039 Hypothyroidism, unspecified: Secondary | ICD-10-CM | POA: Diagnosis not present

## 2015-08-20 DIAGNOSIS — C049 Malignant neoplasm of floor of mouth, unspecified: Secondary | ICD-10-CM | POA: Diagnosis not present

## 2015-08-20 DIAGNOSIS — Z510 Encounter for antineoplastic radiation therapy: Secondary | ICD-10-CM | POA: Diagnosis not present

## 2015-08-20 MED ORDER — SUCRALFATE 1 G PO TABS: 1.0000 g | ORAL_TABLET | Freq: Three times a day (TID) | ORAL | Status: DC

## 2015-08-21 ENCOUNTER — Other Ambulatory Visit: Payer: Self-pay | Admitting: Internal Medicine

## 2015-08-21 ENCOUNTER — Ambulatory Visit: Payer: Commercial Managed Care - HMO

## 2015-08-21 ENCOUNTER — Ambulatory Visit
Admission: RE | Admit: 2015-08-21 | Discharge: 2015-08-21 | Disposition: A | Discharge status: Home / Self Care | Payer: Commercial Managed Care - HMO | Source: Ambulatory Visit | Attending: Radiation Oncology | Admitting: Radiation Oncology

## 2015-08-21 ENCOUNTER — Other Ambulatory Visit: Payer: Self-pay | Admitting: *Deleted

## 2015-08-21 ENCOUNTER — Inpatient Hospital Stay: Payer: Commercial Managed Care - HMO | Attending: Internal Medicine

## 2015-08-21 ENCOUNTER — Inpatient Hospital Stay: Payer: Commercial Managed Care - HMO

## 2015-08-21 DIAGNOSIS — Z923 Personal history of irradiation: Secondary | ICD-10-CM | POA: Diagnosis not present

## 2015-08-21 DIAGNOSIS — C049 Malignant neoplasm of floor of mouth, unspecified: Secondary | ICD-10-CM | POA: Insufficient documentation

## 2015-08-21 DIAGNOSIS — E876 Hypokalemia: Secondary | ICD-10-CM | POA: Insufficient documentation

## 2015-08-21 DIAGNOSIS — R634 Abnormal weight loss: Secondary | ICD-10-CM | POA: Diagnosis not present

## 2015-08-21 DIAGNOSIS — Z5111 Encounter for antineoplastic chemotherapy: Secondary | ICD-10-CM | POA: Insufficient documentation

## 2015-08-21 DIAGNOSIS — E039 Hypothyroidism, unspecified: Secondary | ICD-10-CM | POA: Diagnosis not present

## 2015-08-21 DIAGNOSIS — D649 Anemia, unspecified: Secondary | ICD-10-CM | POA: Diagnosis not present

## 2015-08-21 DIAGNOSIS — E860 Dehydration: Secondary | ICD-10-CM | POA: Insufficient documentation

## 2015-08-21 DIAGNOSIS — I739 Peripheral vascular disease, unspecified: Secondary | ICD-10-CM | POA: Diagnosis not present

## 2015-08-21 DIAGNOSIS — Z79899 Other long term (current) drug therapy: Secondary | ICD-10-CM | POA: Diagnosis not present

## 2015-08-21 DIAGNOSIS — C040 Malignant neoplasm of anterior floor of mouth: Secondary | ICD-10-CM | POA: Diagnosis not present

## 2015-08-21 DIAGNOSIS — Z87891 Personal history of nicotine dependence: Secondary | ICD-10-CM | POA: Diagnosis not present

## 2015-08-21 DIAGNOSIS — B370 Candidal stomatitis: Secondary | ICD-10-CM | POA: Diagnosis not present

## 2015-08-21 DIAGNOSIS — Z510 Encounter for antineoplastic radiation therapy: Secondary | ICD-10-CM | POA: Diagnosis not present

## 2015-08-21 DIAGNOSIS — IMO0002 Reserved for concepts with insufficient information to code with codable children: Secondary | ICD-10-CM

## 2015-08-21 DIAGNOSIS — F17210 Nicotine dependence, cigarettes, uncomplicated: Secondary | ICD-10-CM | POA: Diagnosis not present

## 2015-08-21 DIAGNOSIS — Z931 Gastrostomy status: Secondary | ICD-10-CM | POA: Diagnosis not present

## 2015-08-21 DIAGNOSIS — I10 Essential (primary) hypertension: Secondary | ICD-10-CM | POA: Diagnosis not present

## 2015-08-21 DIAGNOSIS — C799 Secondary malignant neoplasm of unspecified site: Secondary | ICD-10-CM

## 2015-08-21 DIAGNOSIS — D696 Thrombocytopenia, unspecified: Secondary | ICD-10-CM | POA: Insufficient documentation

## 2015-08-21 DIAGNOSIS — R252 Cramp and spasm: Secondary | ICD-10-CM | POA: Diagnosis not present

## 2015-08-21 DIAGNOSIS — E785 Hyperlipidemia, unspecified: Secondary | ICD-10-CM | POA: Diagnosis not present

## 2015-08-21 DIAGNOSIS — C770 Secondary and unspecified malignant neoplasm of lymph nodes of head, face and neck: Secondary | ICD-10-CM | POA: Diagnosis not present

## 2015-08-21 LAB — COMPREHENSIVE METABOLIC PANEL
ALT: 15 U/L (ref 14–54)
AST: 14 U/L — AB (ref 15–41)
Albumin: 3.6 g/dL (ref 3.5–5.0)
Alkaline Phosphatase: 72 U/L (ref 38–126)
Anion gap: 5 (ref 5–15)
BILIRUBIN TOTAL: 0.5 mg/dL (ref 0.3–1.2)
BUN: 15 mg/dL (ref 6–20)
CALCIUM: 8.5 mg/dL — AB (ref 8.9–10.3)
CO2: 25 mmol/L (ref 22–32)
CREATININE: 0.75 mg/dL (ref 0.44–1.00)
Chloride: 101 mmol/L (ref 101–111)
Glucose, Bld: 101 mg/dL — ABNORMAL HIGH (ref 65–99)
Potassium: 4 mmol/L (ref 3.5–5.1)
Sodium: 131 mmol/L — ABNORMAL LOW (ref 135–145)
TOTAL PROTEIN: 6.7 g/dL (ref 6.5–8.1)

## 2015-08-21 LAB — CBC WITH DIFFERENTIAL/PLATELET
BASOS ABS: 0 10*3/uL (ref 0–0.1)
Basophils Relative: 1 %
EOS PCT: 2 %
Eosinophils Absolute: 0.1 10*3/uL (ref 0–0.7)
HEMATOCRIT: 27.8 % — AB (ref 35.0–47.0)
Hemoglobin: 10.1 g/dL — ABNORMAL LOW (ref 12.0–16.0)
LYMPHS ABS: 0.7 10*3/uL — AB (ref 1.0–3.6)
LYMPHS PCT: 15 %
MCH: 33.2 pg (ref 26.0–34.0)
MCHC: 36.4 g/dL — ABNORMAL HIGH (ref 32.0–36.0)
MCV: 91.2 fL (ref 80.0–100.0)
MONO ABS: 0.4 10*3/uL (ref 0.2–0.9)
Monocytes Relative: 8 %
NEUTROS ABS: 3.4 10*3/uL (ref 1.4–6.5)
Neutrophils Relative %: 74 %
Platelets: 148 10*3/uL — ABNORMAL LOW (ref 150–440)
RBC: 3.05 MIL/uL — AB (ref 3.80–5.20)
RDW: 13.4 % (ref 11.5–14.5)
WBC: 4.6 10*3/uL (ref 3.6–11.0)

## 2015-08-21 MED ORDER — SODIUM CHLORIDE 0.9 % IV SOLN
Freq: Once | INTRAVENOUS | Status: AC
  Administered 2015-08-21: 10:00:00 via INTRAVENOUS
  Filled 2015-08-21: qty 1000

## 2015-08-21 MED ORDER — PALONOSETRON HCL INJECTION 0.25 MG/5ML
0.2500 mg | Freq: Once | INTRAVENOUS | Status: AC
  Administered 2015-08-21: 0.25 mg via INTRAVENOUS
  Filled 2015-08-21: qty 5

## 2015-08-21 MED ORDER — SODIUM CHLORIDE 0.9 % IV SOLN
40.0000 mg/m2 | Freq: Once | INTRAVENOUS | Status: AC
  Administered 2015-08-21: 55 mg via INTRAVENOUS
  Filled 2015-08-21: qty 55

## 2015-08-21 MED ORDER — PROCHLORPERAZINE MALEATE 10 MG PO TABS: 10.0000 mg | ORAL_TABLET | Freq: Four times a day (QID) | ORAL | Status: DC | PRN

## 2015-08-21 MED ORDER — SODIUM CHLORIDE 0.9 % IV SOLN
Freq: Once | INTRAVENOUS | Status: AC
  Administered 2015-08-21: 12:00:00 via INTRAVENOUS
  Filled 2015-08-21: qty 5

## 2015-08-21 MED ORDER — HEPARIN SOD (PORK) LOCK FLUSH 100 UNIT/ML IV SOLN
500.0000 [IU] | Freq: Once | INTRAVENOUS | Status: AC | PRN
  Administered 2015-08-21: 500 [IU]
  Filled 2015-08-21: qty 5

## 2015-08-21 MED ORDER — POTASSIUM CHLORIDE 2 MEQ/ML IV SOLN
Freq: Once | INTRAVENOUS | Status: AC
  Administered 2015-08-21: 10:00:00 via INTRAVENOUS
  Filled 2015-08-21: qty 1000

## 2015-08-21 NOTE — Telephone Encounter (Signed)
Compazine Prescription has been renewed and sent to [facility] per pt preference of pharmacy.

## 2015-08-21 NOTE — Addendum Note (Signed)
Addended by: Renita Papa R on: 08/21/2015 05:00 PM   Modules accepted: Orders

## 2015-08-22 ENCOUNTER — Ambulatory Visit: Payer: Commercial Managed Care - HMO

## 2015-08-23 ENCOUNTER — Ambulatory Visit
Admission: RE | Admit: 2015-08-23 | Discharge: 2015-08-23 | Disposition: A | Discharge status: Home / Self Care | Payer: Commercial Managed Care - HMO | Source: Ambulatory Visit | Attending: Radiation Oncology | Admitting: Radiation Oncology

## 2015-08-23 ENCOUNTER — Ambulatory Visit: Payer: Commercial Managed Care - HMO

## 2015-08-23 DIAGNOSIS — E785 Hyperlipidemia, unspecified: Secondary | ICD-10-CM | POA: Diagnosis not present

## 2015-08-23 DIAGNOSIS — R252 Cramp and spasm: Secondary | ICD-10-CM | POA: Diagnosis not present

## 2015-08-23 DIAGNOSIS — R634 Abnormal weight loss: Secondary | ICD-10-CM | POA: Diagnosis not present

## 2015-08-23 DIAGNOSIS — C040 Malignant neoplasm of anterior floor of mouth: Secondary | ICD-10-CM | POA: Diagnosis not present

## 2015-08-23 DIAGNOSIS — C049 Malignant neoplasm of floor of mouth, unspecified: Secondary | ICD-10-CM | POA: Diagnosis not present

## 2015-08-23 DIAGNOSIS — I10 Essential (primary) hypertension: Secondary | ICD-10-CM | POA: Diagnosis not present

## 2015-08-23 DIAGNOSIS — E039 Hypothyroidism, unspecified: Secondary | ICD-10-CM | POA: Diagnosis not present

## 2015-08-23 DIAGNOSIS — Z510 Encounter for antineoplastic radiation therapy: Secondary | ICD-10-CM | POA: Diagnosis not present

## 2015-08-23 DIAGNOSIS — C770 Secondary and unspecified malignant neoplasm of lymph nodes of head, face and neck: Secondary | ICD-10-CM | POA: Diagnosis not present

## 2015-08-23 DIAGNOSIS — F17210 Nicotine dependence, cigarettes, uncomplicated: Secondary | ICD-10-CM | POA: Diagnosis not present

## 2015-08-26 ENCOUNTER — Ambulatory Visit: Payer: Commercial Managed Care - HMO

## 2015-08-26 ENCOUNTER — Other Ambulatory Visit: Payer: Self-pay | Admitting: *Deleted

## 2015-08-26 ENCOUNTER — Encounter: Payer: Commercial Managed Care - HMO | Attending: Internal Medicine | Admitting: Dietician

## 2015-08-26 ENCOUNTER — Encounter: Payer: Self-pay | Admitting: Dietician

## 2015-08-26 ENCOUNTER — Ambulatory Visit
Admission: RE | Admit: 2015-08-26 | Discharge: 2015-08-26 | Disposition: A | Discharge status: Home / Self Care | Payer: Commercial Managed Care - HMO | Source: Ambulatory Visit | Attending: Radiation Oncology | Admitting: Radiation Oncology

## 2015-08-26 VITALS — Ht 61.5 in | Wt 85.5 lb

## 2015-08-26 DIAGNOSIS — R634 Abnormal weight loss: Secondary | ICD-10-CM | POA: Insufficient documentation

## 2015-08-26 DIAGNOSIS — C069 Malignant neoplasm of mouth, unspecified: Secondary | ICD-10-CM

## 2015-08-26 MED ORDER — FLUCONAZOLE 100 MG PO TABS: 100.0000 mg | ORAL_TABLET | Freq: Every day | ORAL | Status: DC

## 2015-08-26 NOTE — Progress Notes (Signed)
Medical Nutrition Therapy: Visit start time: [id number]  end time: 1545  Assessment:  Diagnosis: weight loss; recent diagnosis of infection in mouth Past medical history: oral cancer;  Psychosocial issues/ stress concerns: none Preferred learning method:  . No preference indicated  Current weight: 85.5lbs  Height: 5'1.5" Medications, supplements: reviewed list in chart with patient  Progress and evaluation: Patient reports about 18lbs weight loss prior to cancer diagnosis, and additional 4lbs weight loss since undergoing treatment.           She states she has never eaten much and has always been thin.          She has just developed infection in her mouth, and has not been able to eat or drink much for the past 2 days due to pain.   Physical activity: none, other than regular household chores  Dietary Intake:  Usual eating pattern:  Usually drinks Boost 3 times a day. Cooks dinner in the evenings.  Very little intake over the past 2 days due to mouth pain.  Oatmeal and chicken broth today, 2 boost yesterday   Nutrition Care Education: Topics covered: weight gain, nutrition for cancer treatments    Weight gain: importance of eating often throughout the day; encouraged using trigger as reminder to eat, such as an alarm. Encouraged protein sources. Cancer treatment effects: ways to deal with mouth soreness, nausea, poor appetite, taste changes. Discussed ways to add flavor to foods, choosing cool or room temperature foods, eating small amounts often, soft and easy to digest foods. Other lifestyle changes:  Light activity or fresh, cool air to stimulate appetite.   Nutritional Diagnosis:  Drew-3.1 Underweight As related to cancer and cancer treatments leading to poor appetite and taste changes, some nausea.  As evidenced by patient report.  Intervention: Instruction as noted above.   Set goals with patient input.   Encouraged her to call as needed with any questions.   Education Materials  given:  Marland Kitchen Eating Hints before, during and after cancer treatment, selected pages . Goals/ instructions  Learner/ who was taught:  . Patient  . Spouse/ partner  Level of understanding: Marland Kitchen Verbalizes/ demonstrates competency  Demonstrated degree of understanding via:   Teach back Learning barriers: . None  Willingness to learn/ readiness for change: . Acceptance, ready for change  Monitoring and Evaluation:  Dietary intake, and body weight      follow up: prn

## 2015-08-26 NOTE — Patient Instructions (Signed)
   Make sure to eat a small amount of food every 3 hours. Include some protein often, such as lactaid milk, yogurt, cheese, peanut butter, eggs, or lean meat. Set an alarm if needed to remind yourself to eat.   Choose foods that are easy to chew and swallow, soft foods.   Try a spoon of peanut butter; try adding a small amount of honey for more flavor.

## 2015-08-27 ENCOUNTER — Ambulatory Visit: Payer: Commercial Managed Care - HMO

## 2015-08-27 ENCOUNTER — Other Ambulatory Visit: Payer: Self-pay

## 2015-08-27 ENCOUNTER — Other Ambulatory Visit: Payer: Self-pay | Admitting: *Deleted

## 2015-08-27 ENCOUNTER — Other Ambulatory Visit: Payer: Self-pay | Admitting: Internal Medicine

## 2015-08-27 ENCOUNTER — Encounter: Payer: Self-pay | Admitting: Internal Medicine

## 2015-08-27 DIAGNOSIS — C049 Malignant neoplasm of floor of mouth, unspecified: Secondary | ICD-10-CM

## 2015-08-27 DIAGNOSIS — R634 Abnormal weight loss: Secondary | ICD-10-CM | POA: Insufficient documentation

## 2015-08-28 ENCOUNTER — Ambulatory Visit
Admission: RE | Admit: 2015-08-28 | Discharge: 2015-08-28 | Disposition: A | Discharge status: Home / Self Care | Payer: Commercial Managed Care - HMO | Source: Ambulatory Visit | Attending: Radiation Oncology | Admitting: Radiation Oncology

## 2015-08-28 ENCOUNTER — Ambulatory Visit: Payer: Commercial Managed Care - HMO

## 2015-08-28 ENCOUNTER — Inpatient Hospital Stay: Payer: Commercial Managed Care - HMO

## 2015-08-28 ENCOUNTER — Inpatient Hospital Stay (HOSPITAL_BASED_OUTPATIENT_CLINIC_OR_DEPARTMENT_OTHER): Payer: Commercial Managed Care - HMO | Admitting: Internal Medicine

## 2015-08-28 VITALS — BP 137/83 | HR 75 | Temp 95.1°F | Resp 18 | Wt 82.9 lb

## 2015-08-28 DIAGNOSIS — R634 Abnormal weight loss: Secondary | ICD-10-CM | POA: Diagnosis not present

## 2015-08-28 DIAGNOSIS — E876 Hypokalemia: Secondary | ICD-10-CM | POA: Diagnosis not present

## 2015-08-28 DIAGNOSIS — Z5111 Encounter for antineoplastic chemotherapy: Secondary | ICD-10-CM | POA: Diagnosis not present

## 2015-08-28 DIAGNOSIS — D649 Anemia, unspecified: Secondary | ICD-10-CM

## 2015-08-28 DIAGNOSIS — Z79899 Other long term (current) drug therapy: Secondary | ICD-10-CM

## 2015-08-28 DIAGNOSIS — C049 Malignant neoplasm of floor of mouth, unspecified: Secondary | ICD-10-CM

## 2015-08-28 DIAGNOSIS — B370 Candidal stomatitis: Secondary | ICD-10-CM

## 2015-08-28 DIAGNOSIS — Z923 Personal history of irradiation: Secondary | ICD-10-CM

## 2015-08-28 DIAGNOSIS — Z87891 Personal history of nicotine dependence: Secondary | ICD-10-CM

## 2015-08-28 DIAGNOSIS — D696 Thrombocytopenia, unspecified: Secondary | ICD-10-CM

## 2015-08-28 DIAGNOSIS — E860 Dehydration: Secondary | ICD-10-CM | POA: Diagnosis not present

## 2015-08-28 LAB — CBC
HEMATOCRIT: 27.3 % — AB (ref 35.0–47.0)
HEMOGLOBIN: 9.8 g/dL — AB (ref 12.0–16.0)
MCH: 32.9 pg (ref 26.0–34.0)
MCHC: 35.8 g/dL (ref 32.0–36.0)
MCV: 91.8 fL (ref 80.0–100.0)
Platelets: 112 10*3/uL — ABNORMAL LOW (ref 150–440)
RBC: 2.98 MIL/uL — AB (ref 3.80–5.20)
RDW: 13.5 % (ref 11.5–14.5)
WBC: 4.3 10*3/uL (ref 3.6–11.0)

## 2015-08-28 LAB — BASIC METABOLIC PANEL
ANION GAP: 11 (ref 5–15)
BUN: 35 mg/dL — ABNORMAL HIGH (ref 6–20)
CALCIUM: 8.5 mg/dL — AB (ref 8.9–10.3)
CHLORIDE: 99 mmol/L — AB (ref 101–111)
CO2: 21 mmol/L — AB (ref 22–32)
Creatinine, Ser: 0.84 mg/dL (ref 0.44–1.00)
GFR calc non Af Amer: >60 mL/min (ref >60)
Glucose, Bld: 79 mg/dL (ref 65–99)
POTASSIUM: 4.2 mmol/L (ref 3.5–5.1)
Sodium: 131 mmol/L — ABNORMAL LOW (ref 135–145)

## 2015-08-28 LAB — MAGNESIUM: MAGNESIUM: 1.9 mg/dL (ref 1.7–2.4)

## 2015-08-28 MED ORDER — HEPARIN SOD (PORK) LOCK FLUSH 100 UNIT/ML IV SOLN
500.0000 [IU] | Freq: Once | INTRAVENOUS | Status: AC | PRN
  Administered 2015-08-28: 500 [IU]
  Filled 2015-08-28: qty 5

## 2015-08-28 MED ORDER — SODIUM CHLORIDE 0.9 % IV SOLN
40.0000 mg/m2 | Freq: Once | INTRAVENOUS | Status: AC
  Administered 2015-08-28: 55 mg via INTRAVENOUS
  Filled 2015-08-28: qty 55

## 2015-08-28 MED ORDER — POTASSIUM CHLORIDE 2 MEQ/ML IV SOLN
Freq: Once | INTRAVENOUS | Status: AC
  Administered 2015-08-28: 10:00:00 via INTRAVENOUS
  Filled 2015-08-28: qty 10

## 2015-08-28 MED ORDER — SODIUM CHLORIDE 0.9 % IV SOLN
Freq: Once | INTRAVENOUS | Status: AC
  Administered 2015-08-28: 10:00:00 via INTRAVENOUS
  Filled 2015-08-28: qty 1000

## 2015-08-28 MED ORDER — SODIUM CHLORIDE 0.9% FLUSH
10.0000 mL | INTRAVENOUS | Status: DC | PRN
Start: 2015-08-28 — End: 2015-08-28
  Administered 2015-08-28: 10 mL
  Filled 2015-08-28: qty 10

## 2015-08-28 MED ORDER — PALONOSETRON HCL INJECTION 0.25 MG/5ML
0.2500 mg | Freq: Once | INTRAVENOUS | Status: AC
  Administered 2015-08-28: 0.25 mg via INTRAVENOUS
  Filled 2015-08-28: qty 5

## 2015-08-28 MED ORDER — SODIUM CHLORIDE 0.9 % IV SOLN
Freq: Once | INTRAVENOUS | Status: AC
  Administered 2015-08-28: 12:00:00 via INTRAVENOUS
  Filled 2015-08-28: qty 5

## 2015-08-28 NOTE — Assessment & Plan Note (Addendum)
#   Nutrition/weight loss- secondary to poor taste; s/p nutrition evaluation. Recommend 2-3 post once a day. Patient has previously declined PEG tube; now open to the idea. Recommend PEG tube placement through interventional radiology.

## 2015-08-28 NOTE — Progress Notes (Signed)
Labwork drawn this am. No ANC drawn. Per Dr Rogue Bussing okay to proceed with treatment

## 2015-08-28 NOTE — Assessment & Plan Note (Addendum)
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 Unresectable- Currently on concurrent chemoradiation status post 3 cycles of chemotherapy. No significant clinical improvement noted.  Radiation is on hold because of thrush for the last 3 days.  # Proceed with cycle #5 of cisplatin along with radiation. CBC CMP unremarkable except for mild anemia hemoglobin 10/thrombocytopenia platelets 130s. Chemistries within normal limits.

## 2015-08-28 NOTE — Assessment & Plan Note (Signed)
Plan IVF this Friday/ next Monday.

## 2015-08-28 NOTE — Progress Notes (Signed)
[facility] NOTE  Patient Care Team: Tracie Harrier, MD as PCP - General (Internal Medicine) Noreene Filbert, MD as Referring Physician (Radiation Oncology) Algernon Huxley, MD as Referring Physician (Vascular Surgery)  CHIEF COMPLAINTS/PURPOSE OF CONSULTATION:   # April 2017-[facility] [STAGE IVA T4N2; Dr.McQueen; Left floor of mouth -Bx; Left neck LN-FNA+] left tongue/ floor of mouth- 4 x 3 x 2 cm.; 4.3 x 3.3 x 3 cm complex mass extends through the left mylohyoid muscle below the left mandible anterior to left submandibular gland] - START Cis [5/15]- weekly with RT [5/17]  # Smoker # weight loss  HISTORY OF PRESENTING ILLNESS:  Rebecca Mcmillan 70 y.o.  female with above history of  squamous cell carcinoma of the left base of tongue/floor of mouth- currently on concurrent chemoradiation therapy history for follow-up  Patient has finished 4 weekly treatments of cisplatin along with radiation- no significant improvement noted.  Patient continues to lose weight. Poor by mouth intake. Radiation on hold for the last 3 days because of thrush. She is on Diflucan.  She denies any significant pain.   ROS: A complete 10 point review of system is done which is negative except mentioned above in history of present illness  MEDICAL HISTORY:  Past Medical History  Diagnosis Date  . Hypertension   . Thyroid disease   . Hypothyroidism   . Hyperlipidemia     SURGICAL HISTORY: None  SOCIAL HISTORY: Patient previously used to work in nursing homes. Denies any alcohol. Smokes a pack of Celexa day for 45 years lives with her husband in [place]. No children.  Social History   Social History  . Marital Status: Married    Spouse Name: N/A  . Number of Children: N/A  . Years of Education: N/A   Occupational History  . Not on file.   Social History Main Topics  . Smoking status: Former Smoker -- 1.00 packs/day for 45 years    Types: Cigarettes    Quit date: 06/19/2015  .  Smokeless tobacco: Never Used  . Alcohol Use: No  . Drug Use: No  . Sexual Activity: Not on file   Other Topics Concern  . Not on file   Social History Narrative    FAMILY HISTORY:no family history of head and neck cancer.   ALLERGIES:  is allergic to garlic; lactose intolerance (gi); and tetracyclines & related.  MEDICATIONS:  Current Outpatient Prescriptions  Medication Sig Dispense Refill  . atenolol (TENORMIN) 25 MG tablet Take 25 mg by mouth daily.     . fluconazole (DIFLUCAN) 100 MG tablet Take 1 tablet (100 mg total) by mouth daily. 7 tablet 0  . levothyroxine (SYNTHROID, LEVOTHROID) 50 MCG tablet     . lidocaine-prilocaine (EMLA) cream Apply 1 application topically as needed. 30 g 6  . losartan (COZAAR) 100 MG tablet     . lovastatin (MEVACOR) 40 MG tablet Take 40 mg by mouth every evening.     . ondansetron (ZOFRAN) 8 MG tablet TAKE 1 TABLET EVERY 8 HOURS AS NEEDED FOR NAUSEA AND VOMITING (START 3 DAYS AFTER CHEMOTHERAPY) 40 tablet 0  . prochlorperazine (COMPAZINE) 10 MG tablet Take 1 tablet (10 mg total) by mouth every 6 (six) hours as needed for nausea or vomiting. 30 tablet 0  . sucralfate (CARAFATE) 1 g tablet Take 1 tablet (1 g total) by mouth 4 (four) times daily -  with meals and at bedtime. 90 tablet 3  . oxyCODONE-acetaminophen (ROXICET) 5-325 MG tablet  Take 1-2 tablets by mouth every 4 (four) hours as needed for severe pain. (Patient not taking: Reported on 08/26/2015) 40 tablet 0   No current facility-administered medications for this visit.      Marland Kitchen  PHYSICAL EXAMINATION: ECOG PERFORMANCE STATUS: 1 - Symptomatic but completely ambulatory  Filed Vitals:   08/28/15 0845  BP: 137/83  Pulse: 75  Temp: 95.1 F (35.1 C)  Resp: 18   Filed Weights   08/28/15 0845  Weight: 82 lb 14.3 oz (37.6 kg)    GENERAL: Thin built moderately nourished Caucasian female patient anxious. Alert, no distress and comfortable. She is Accompanied by her husband.  EYES: no  pallor or icterus OROPHARYNX: no thrush or ulceration; poor dentition. No obvious masses noted in the oral cavity. NECK: supple, 5-6cm mass/firm- submandibular mass. Non-tender.  LYMPH:  no palpable lymphadenopathy xillary or inguinal regions LUNGS: Decreased breath sounds bilaterally No wheeze or crackles; barrel chested.  HEART/CVS: regular rate & rhythm and no murmurs; No lower extremity edema ABDOMEN: abdomen soft, non-tender and normal bowel sounds Musculoskeletal:no cyanosis of digits and no clubbing  PSYCH: alert & oriented x 3 with fluent speech NEURO: no focal motor/sensory deficits SKIN:  no rashes or significant lesions  LABORATORY DATA:  I have reviewed the data as listed Lab Results  Component Value Date   WBC 4.3 08/28/2015   HGB 9.8* 08/28/2015   HCT 27.3* 08/28/2015   MCV 91.8 08/28/2015   PLT 112* 08/28/2015    Recent Labs  08/05/15 0827 08/14/15 0838 08/21/15 0814 08/28/15 0814  NA 129* 132* 131* 131*  K 4.0 3.9 4.0 4.2  CL 98* 101 101 99*  CO2 24 24 25  21*  GLUCOSE 94 108* 101* 79  BUN 13 12 15  35*  CREATININE 0.69 0.71 0.75 0.84  CALCIUM 8.7* 8.6* 8.5* 8.5*  GFRNONAA >60 >60 >60 >60  GFRAA >60 >60 >60 >60  PROT 6.6 6.4* 6.7  --   ALBUMIN 3.7 3.6 3.6  --   AST 16 16 14*  --   ALT 17 18 15   --   ALKPHOS 62 72 72  --   BILITOT 0.9 0.4 0.5  --     ASSESSMENT & PLAN:  Cancer of floor of mouth (HCC)  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 Unresectable- Currently on concurrent chemoradiation status post 3 cycles of chemotherapy. No significant clinical improvement noted.  Radiation is on hold because of thrush for the last 3 days.  # Proceed with cycle #5 of cisplatin along with radiation. CBC CMP unremarkable except for mild anemia hemoglobin 10/thrombocytopenia platelets 130s. Chemistries within normal limits.  Weight loss, abnormal # Nutrition/weight loss- secondary to poor taste; s/p  nutrition evaluation. Recommend 2-3 post once a day. Patient has previously declined PEG tube; now open to the idea. Recommend PEG tube placement through interventional radiology.   Dehydration Plan IVF this Friday/ next Monday.   Thrush On Diflucan 100 mg x 7 days. Mild improvement noted.   # patient will get weekly CBC BMP and magnesium/cisplatin; follow-up with me in 2 weeks.    Cammie Sickle, MD 08/28/2015 6:50 PM

## 2015-08-28 NOTE — Assessment & Plan Note (Signed)
On Diflucan 100 mg x 7 days. Mild improvement noted.

## 2015-08-28 NOTE — Progress Notes (Signed)
Patient states she has had a fungal infection in her mouth since the weekend.  On Monday Dr. Donella Stade prescribed Diflucan.  Patient has been unable to eat and drink.  She has had a little broth and Boost over the past 3 days.  Patient has had a 12# weight loss in one month.

## 2015-08-29 ENCOUNTER — Ambulatory Visit
Admission: RE | Admit: 2015-08-29 | Discharge: 2015-08-29 | Disposition: A | Discharge status: Home / Self Care | Payer: Commercial Managed Care - HMO | Source: Ambulatory Visit | Attending: Radiation Oncology | Admitting: Radiation Oncology

## 2015-08-29 ENCOUNTER — Ambulatory Visit: Payer: Commercial Managed Care - HMO

## 2015-08-29 DIAGNOSIS — C770 Secondary and unspecified malignant neoplasm of lymph nodes of head, face and neck: Secondary | ICD-10-CM | POA: Diagnosis not present

## 2015-08-29 DIAGNOSIS — Z510 Encounter for antineoplastic radiation therapy: Secondary | ICD-10-CM | POA: Diagnosis not present

## 2015-08-29 DIAGNOSIS — F17210 Nicotine dependence, cigarettes, uncomplicated: Secondary | ICD-10-CM | POA: Diagnosis not present

## 2015-08-29 DIAGNOSIS — C040 Malignant neoplasm of anterior floor of mouth: Secondary | ICD-10-CM | POA: Diagnosis not present

## 2015-08-29 DIAGNOSIS — E785 Hyperlipidemia, unspecified: Secondary | ICD-10-CM | POA: Diagnosis not present

## 2015-08-29 DIAGNOSIS — R634 Abnormal weight loss: Secondary | ICD-10-CM | POA: Diagnosis not present

## 2015-08-29 DIAGNOSIS — E039 Hypothyroidism, unspecified: Secondary | ICD-10-CM | POA: Diagnosis not present

## 2015-08-29 DIAGNOSIS — I10 Essential (primary) hypertension: Secondary | ICD-10-CM | POA: Diagnosis not present

## 2015-08-29 DIAGNOSIS — R252 Cramp and spasm: Secondary | ICD-10-CM | POA: Diagnosis not present

## 2015-08-29 DIAGNOSIS — C049 Malignant neoplasm of floor of mouth, unspecified: Secondary | ICD-10-CM | POA: Diagnosis not present

## 2015-08-30 ENCOUNTER — Telehealth: Payer: Self-pay | Admitting: *Deleted

## 2015-08-30 ENCOUNTER — Ambulatory Visit
Admission: RE | Admit: 2015-08-30 | Discharge: 2015-08-30 | Disposition: A | Discharge status: Home / Self Care | Payer: Commercial Managed Care - HMO | Source: Ambulatory Visit | Attending: Radiation Oncology | Admitting: Radiation Oncology

## 2015-08-30 ENCOUNTER — Inpatient Hospital Stay: Payer: Commercial Managed Care - HMO

## 2015-08-30 ENCOUNTER — Ambulatory Visit: Payer: Commercial Managed Care - HMO

## 2015-08-30 VITALS — BP 118/80 | HR 70 | Resp 18

## 2015-08-30 DIAGNOSIS — C040 Malignant neoplasm of anterior floor of mouth: Secondary | ICD-10-CM | POA: Diagnosis not present

## 2015-08-30 DIAGNOSIS — R252 Cramp and spasm: Secondary | ICD-10-CM | POA: Diagnosis not present

## 2015-08-30 DIAGNOSIS — E860 Dehydration: Secondary | ICD-10-CM | POA: Diagnosis not present

## 2015-08-30 DIAGNOSIS — D696 Thrombocytopenia, unspecified: Secondary | ICD-10-CM | POA: Diagnosis not present

## 2015-08-30 DIAGNOSIS — E785 Hyperlipidemia, unspecified: Secondary | ICD-10-CM | POA: Diagnosis not present

## 2015-08-30 DIAGNOSIS — F17210 Nicotine dependence, cigarettes, uncomplicated: Secondary | ICD-10-CM | POA: Diagnosis not present

## 2015-08-30 DIAGNOSIS — Z510 Encounter for antineoplastic radiation therapy: Secondary | ICD-10-CM | POA: Diagnosis not present

## 2015-08-30 DIAGNOSIS — E039 Hypothyroidism, unspecified: Secondary | ICD-10-CM | POA: Diagnosis not present

## 2015-08-30 DIAGNOSIS — C049 Malignant neoplasm of floor of mouth, unspecified: Secondary | ICD-10-CM | POA: Diagnosis not present

## 2015-08-30 DIAGNOSIS — R634 Abnormal weight loss: Secondary | ICD-10-CM | POA: Diagnosis not present

## 2015-08-30 DIAGNOSIS — D649 Anemia, unspecified: Secondary | ICD-10-CM | POA: Diagnosis not present

## 2015-08-30 DIAGNOSIS — I10 Essential (primary) hypertension: Secondary | ICD-10-CM | POA: Diagnosis not present

## 2015-08-30 DIAGNOSIS — C770 Secondary and unspecified malignant neoplasm of lymph nodes of head, face and neck: Secondary | ICD-10-CM | POA: Diagnosis not present

## 2015-08-30 DIAGNOSIS — E876 Hypokalemia: Secondary | ICD-10-CM | POA: Diagnosis not present

## 2015-08-30 DIAGNOSIS — B370 Candidal stomatitis: Secondary | ICD-10-CM | POA: Diagnosis not present

## 2015-08-30 DIAGNOSIS — Z5111 Encounter for antineoplastic chemotherapy: Secondary | ICD-10-CM | POA: Diagnosis not present

## 2015-08-30 MED ORDER — SODIUM CHLORIDE 0.9 % IJ SOLN
10.0000 mL | INTRAMUSCULAR | Status: DC | PRN
  Administered 2015-08-30: 10 mL
  Filled 2015-08-30: qty 10

## 2015-08-30 MED ORDER — SODIUM CHLORIDE 0.9 % IV SOLN
Freq: Once | INTRAVENOUS | Status: AC
  Administered 2015-08-30: 14:00:00 via INTRAVENOUS
  Filled 2015-08-30: qty 1000

## 2015-08-30 MED ORDER — HEPARIN SOD (PORK) LOCK FLUSH 100 UNIT/ML IV SOLN
500.0000 [IU] | Freq: Once | INTRAVENOUS | Status: AC | PRN
  Administered 2015-08-30: 500 [IU]

## 2015-08-30 NOTE — Telephone Encounter (Signed)
rcvd call from speciality scheduling. Peg tube insertion to be scheduled on 09/05/15 . Pt instructed that she will need to bring a driver. She will need to  Arrive at 7am. Be NPO after midnight. She will also be required to drink contrast. I asked her if she could swallow lqd barium. She stated, "I feel that I would be able to do that. My mouth is getting a little better and not as sore."

## 2015-09-02 ENCOUNTER — Ambulatory Visit: Payer: Commercial Managed Care - HMO

## 2015-09-02 ENCOUNTER — Inpatient Hospital Stay: Payer: Commercial Managed Care - HMO

## 2015-09-02 ENCOUNTER — Ambulatory Visit
Admission: RE | Admit: 2015-09-02 | Discharge: 2015-09-02 | Disposition: A | Discharge status: Home / Self Care | Payer: Commercial Managed Care - HMO | Source: Ambulatory Visit | Attending: Radiation Oncology | Admitting: Radiation Oncology

## 2015-09-02 VITALS — BP 166/73 | HR 50 | Temp 94.9°F | Resp 20

## 2015-09-02 DIAGNOSIS — Z510 Encounter for antineoplastic radiation therapy: Secondary | ICD-10-CM | POA: Diagnosis not present

## 2015-09-02 DIAGNOSIS — B370 Candidal stomatitis: Secondary | ICD-10-CM | POA: Diagnosis not present

## 2015-09-02 DIAGNOSIS — C040 Malignant neoplasm of anterior floor of mouth: Secondary | ICD-10-CM | POA: Diagnosis not present

## 2015-09-02 DIAGNOSIS — C049 Malignant neoplasm of floor of mouth, unspecified: Secondary | ICD-10-CM | POA: Diagnosis not present

## 2015-09-02 DIAGNOSIS — D696 Thrombocytopenia, unspecified: Secondary | ICD-10-CM | POA: Diagnosis not present

## 2015-09-02 DIAGNOSIS — F17210 Nicotine dependence, cigarettes, uncomplicated: Secondary | ICD-10-CM | POA: Diagnosis not present

## 2015-09-02 DIAGNOSIS — I10 Essential (primary) hypertension: Secondary | ICD-10-CM | POA: Diagnosis not present

## 2015-09-02 DIAGNOSIS — E876 Hypokalemia: Secondary | ICD-10-CM | POA: Diagnosis not present

## 2015-09-02 DIAGNOSIS — C770 Secondary and unspecified malignant neoplasm of lymph nodes of head, face and neck: Secondary | ICD-10-CM | POA: Diagnosis not present

## 2015-09-02 DIAGNOSIS — E860 Dehydration: Secondary | ICD-10-CM | POA: Diagnosis not present

## 2015-09-02 DIAGNOSIS — Z5111 Encounter for antineoplastic chemotherapy: Secondary | ICD-10-CM | POA: Diagnosis not present

## 2015-09-02 DIAGNOSIS — D649 Anemia, unspecified: Secondary | ICD-10-CM | POA: Diagnosis not present

## 2015-09-02 DIAGNOSIS — E039 Hypothyroidism, unspecified: Secondary | ICD-10-CM | POA: Diagnosis not present

## 2015-09-02 DIAGNOSIS — R252 Cramp and spasm: Secondary | ICD-10-CM | POA: Diagnosis not present

## 2015-09-02 DIAGNOSIS — R634 Abnormal weight loss: Secondary | ICD-10-CM | POA: Diagnosis not present

## 2015-09-02 DIAGNOSIS — E785 Hyperlipidemia, unspecified: Secondary | ICD-10-CM | POA: Diagnosis not present

## 2015-09-02 MED ORDER — HEPARIN SOD (PORK) LOCK FLUSH 100 UNIT/ML IV SOLN
500.0000 [IU] | Freq: Once | INTRAVENOUS | Status: AC | PRN
  Administered 2015-09-02: 500 [IU]

## 2015-09-02 MED ORDER — HEPARIN SOD (PORK) LOCK FLUSH 100 UNIT/ML IV SOLN
INTRAVENOUS | Status: AC
  Filled 2015-09-02: qty 5

## 2015-09-02 MED ORDER — SODIUM CHLORIDE 0.9 % IV SOLN
Freq: Once | INTRAVENOUS | Status: AC
  Administered 2015-09-02: 14:00:00 via INTRAVENOUS
  Filled 2015-09-02: qty 1000

## 2015-09-02 MED ORDER — SODIUM CHLORIDE 0.9 % IJ SOLN
10.0000 mL | INTRAMUSCULAR | Status: DC | PRN
  Administered 2015-09-02: 10 mL
  Filled 2015-09-02: qty 10

## 2015-09-03 ENCOUNTER — Ambulatory Visit: Payer: Commercial Managed Care - HMO

## 2015-09-03 ENCOUNTER — Other Ambulatory Visit: Payer: Self-pay | Admitting: Internal Medicine

## 2015-09-03 ENCOUNTER — Ambulatory Visit
Admission: RE | Admit: 2015-09-03 | Discharge: 2015-09-03 | Disposition: A | Discharge status: Home / Self Care | Payer: Commercial Managed Care - HMO | Source: Ambulatory Visit | Attending: Radiation Oncology | Admitting: Radiation Oncology

## 2015-09-03 DIAGNOSIS — C049 Malignant neoplasm of floor of mouth, unspecified: Secondary | ICD-10-CM | POA: Diagnosis not present

## 2015-09-03 DIAGNOSIS — E785 Hyperlipidemia, unspecified: Secondary | ICD-10-CM | POA: Diagnosis not present

## 2015-09-03 DIAGNOSIS — E039 Hypothyroidism, unspecified: Secondary | ICD-10-CM | POA: Diagnosis not present

## 2015-09-03 DIAGNOSIS — C040 Malignant neoplasm of anterior floor of mouth: Secondary | ICD-10-CM | POA: Diagnosis not present

## 2015-09-03 DIAGNOSIS — R634 Abnormal weight loss: Secondary | ICD-10-CM | POA: Diagnosis not present

## 2015-09-03 DIAGNOSIS — F17210 Nicotine dependence, cigarettes, uncomplicated: Secondary | ICD-10-CM | POA: Diagnosis not present

## 2015-09-03 DIAGNOSIS — C770 Secondary and unspecified malignant neoplasm of lymph nodes of head, face and neck: Secondary | ICD-10-CM | POA: Diagnosis not present

## 2015-09-03 DIAGNOSIS — R252 Cramp and spasm: Secondary | ICD-10-CM | POA: Diagnosis not present

## 2015-09-03 DIAGNOSIS — Z510 Encounter for antineoplastic radiation therapy: Secondary | ICD-10-CM | POA: Diagnosis not present

## 2015-09-03 DIAGNOSIS — I10 Essential (primary) hypertension: Secondary | ICD-10-CM | POA: Diagnosis not present

## 2015-09-04 ENCOUNTER — Other Ambulatory Visit: Payer: Self-pay | Admitting: Internal Medicine

## 2015-09-04 ENCOUNTER — Ambulatory Visit
Admission: RE | Admit: 2015-09-04 | Discharge: 2015-09-04 | Disposition: A | Discharge status: Home / Self Care | Payer: Commercial Managed Care - HMO | Source: Ambulatory Visit | Attending: Radiation Oncology | Admitting: Radiation Oncology

## 2015-09-04 ENCOUNTER — Inpatient Hospital Stay: Payer: Commercial Managed Care - HMO

## 2015-09-04 ENCOUNTER — Ambulatory Visit: Payer: Commercial Managed Care - HMO

## 2015-09-04 ENCOUNTER — Other Ambulatory Visit: Payer: Self-pay | Admitting: Radiology

## 2015-09-04 VITALS — BP 145/69 | HR 60 | Temp 95.0°F | Resp 20

## 2015-09-04 DIAGNOSIS — C049 Malignant neoplasm of floor of mouth, unspecified: Secondary | ICD-10-CM

## 2015-09-04 DIAGNOSIS — E860 Dehydration: Secondary | ICD-10-CM | POA: Diagnosis not present

## 2015-09-04 DIAGNOSIS — B370 Candidal stomatitis: Secondary | ICD-10-CM | POA: Diagnosis not present

## 2015-09-04 DIAGNOSIS — R634 Abnormal weight loss: Secondary | ICD-10-CM | POA: Diagnosis not present

## 2015-09-04 DIAGNOSIS — C040 Malignant neoplasm of anterior floor of mouth: Secondary | ICD-10-CM | POA: Diagnosis not present

## 2015-09-04 DIAGNOSIS — E876 Hypokalemia: Secondary | ICD-10-CM | POA: Diagnosis not present

## 2015-09-04 DIAGNOSIS — R252 Cramp and spasm: Secondary | ICD-10-CM | POA: Diagnosis not present

## 2015-09-04 DIAGNOSIS — I10 Essential (primary) hypertension: Secondary | ICD-10-CM | POA: Diagnosis not present

## 2015-09-04 DIAGNOSIS — D696 Thrombocytopenia, unspecified: Secondary | ICD-10-CM | POA: Diagnosis not present

## 2015-09-04 DIAGNOSIS — E785 Hyperlipidemia, unspecified: Secondary | ICD-10-CM | POA: Diagnosis not present

## 2015-09-04 DIAGNOSIS — Z510 Encounter for antineoplastic radiation therapy: Secondary | ICD-10-CM | POA: Diagnosis not present

## 2015-09-04 DIAGNOSIS — Z5111 Encounter for antineoplastic chemotherapy: Secondary | ICD-10-CM | POA: Diagnosis not present

## 2015-09-04 DIAGNOSIS — D649 Anemia, unspecified: Secondary | ICD-10-CM | POA: Diagnosis not present

## 2015-09-04 DIAGNOSIS — E039 Hypothyroidism, unspecified: Secondary | ICD-10-CM | POA: Diagnosis not present

## 2015-09-04 DIAGNOSIS — C770 Secondary and unspecified malignant neoplasm of lymph nodes of head, face and neck: Secondary | ICD-10-CM | POA: Diagnosis not present

## 2015-09-04 DIAGNOSIS — F17210 Nicotine dependence, cigarettes, uncomplicated: Secondary | ICD-10-CM | POA: Diagnosis not present

## 2015-09-04 LAB — MAGNESIUM: Magnesium: 1.6 mg/dL — ABNORMAL LOW (ref 1.7–2.4)

## 2015-09-04 LAB — BASIC METABOLIC PANEL
Anion gap: 6 (ref 5–15)
BUN: 11 mg/dL (ref 6–20)
CALCIUM: 8.4 mg/dL — AB (ref 8.9–10.3)
CO2: 25 mmol/L (ref 22–32)
CREATININE: 0.72 mg/dL (ref 0.44–1.00)
Chloride: 102 mmol/L (ref 101–111)
GFR calc non Af Amer: >60 mL/min (ref >60)
GLUCOSE: 125 mg/dL — AB (ref 65–99)
Potassium: 3.6 mmol/L (ref 3.5–5.1)
Sodium: 133 mmol/L — ABNORMAL LOW (ref 135–145)

## 2015-09-04 LAB — CBC WITH DIFFERENTIAL/PLATELET
BASOS PCT: 1 %
Basophils Absolute: 0 10*3/uL (ref 0–0.1)
Eosinophils Absolute: 0 10*3/uL (ref 0–0.7)
Eosinophils Relative: 1 %
HEMATOCRIT: 23.2 % — AB (ref 35.0–47.0)
Hemoglobin: 8.4 g/dL — ABNORMAL LOW (ref 12.0–16.0)
LYMPHS ABS: 0.4 10*3/uL — AB (ref 1.0–3.6)
Lymphocytes Relative: 15 %
MCH: 33.2 pg (ref 26.0–34.0)
MCHC: 36.2 g/dL — AB (ref 32.0–36.0)
MCV: 91.8 fL (ref 80.0–100.0)
MONO ABS: 0.3 10*3/uL (ref 0.2–0.9)
MONOS PCT: 10 %
NEUTROS ABS: 2.2 10*3/uL (ref 1.4–6.5)
Neutrophils Relative %: 73 %
Platelets: 79 10*3/uL — ABNORMAL LOW (ref 150–440)
RBC: 2.53 MIL/uL — ABNORMAL LOW (ref 3.80–5.20)
RDW: 13.6 % (ref 11.5–14.5)
WBC: 3 10*3/uL — ABNORMAL LOW (ref 3.6–11.0)

## 2015-09-04 MED ORDER — SODIUM CHLORIDE 0.9% FLUSH
10.0000 mL | INTRAVENOUS | Status: DC | PRN
  Administered 2015-09-04: 10 mL via INTRAVENOUS
  Filled 2015-09-04: qty 10

## 2015-09-04 MED ORDER — POTASSIUM CHLORIDE 2 MEQ/ML IV SOLN
Freq: Once | INTRAVENOUS | Status: AC
  Administered 2015-09-04: 10:00:00 via INTRAVENOUS
  Filled 2015-09-04: qty 1000

## 2015-09-04 MED ORDER — HEPARIN SOD (PORK) LOCK FLUSH 100 UNIT/ML IV SOLN
500.0000 [IU] | Freq: Once | INTRAVENOUS | Status: AC
  Administered 2015-09-04: 500 [IU] via INTRAVENOUS
  Filled 2015-09-04: qty 5

## 2015-09-04 MED ORDER — PALONOSETRON HCL INJECTION 0.25 MG/5ML
0.2500 mg | Freq: Once | INTRAVENOUS | Status: AC
  Administered 2015-09-04: 0.25 mg via INTRAVENOUS
  Filled 2015-09-04: qty 5

## 2015-09-04 MED ORDER — SODIUM CHLORIDE 0.9 % IV SOLN
Freq: Once | INTRAVENOUS | Status: AC
  Administered 2015-09-04: 12:00:00 via INTRAVENOUS
  Filled 2015-09-04: qty 5

## 2015-09-04 MED ORDER — SODIUM CHLORIDE 0.9 % IV SOLN
Freq: Once | INTRAVENOUS | Status: AC
  Administered 2015-09-04: 09:00:00 via INTRAVENOUS
  Filled 2015-09-04: qty 1000

## 2015-09-04 MED ORDER — SODIUM CHLORIDE 0.9 % IV SOLN
40.0000 mg/m2 | Freq: Once | INTRAVENOUS | Status: AC
  Administered 2015-09-04: 55 mg via INTRAVENOUS
  Filled 2015-09-04: qty 55

## 2015-09-04 NOTE — Progress Notes (Signed)
Plt =79.  Dr. B ok to proceed with treatment today

## 2015-09-05 ENCOUNTER — Ambulatory Visit: Payer: Commercial Managed Care - HMO

## 2015-09-05 ENCOUNTER — Ambulatory Visit
Admission: RE | Admit: 2015-09-05 | Discharge: 2015-09-05 | Disposition: A | Discharge status: Home / Self Care | Payer: Commercial Managed Care - HMO | Source: Ambulatory Visit | Attending: Internal Medicine | Admitting: Internal Medicine

## 2015-09-05 ENCOUNTER — Telehealth: Payer: Self-pay | Admitting: *Deleted

## 2015-09-05 ENCOUNTER — Ambulatory Visit: Admission: RE | Admit: 2015-09-05 | Payer: Commercial Managed Care - HMO | Source: Ambulatory Visit

## 2015-09-05 ENCOUNTER — Ambulatory Visit
Admission: RE | Admit: 2015-09-05 | Discharge: 2015-09-05 | Disposition: A | Discharge status: Home / Self Care | Payer: Commercial Managed Care - HMO | Source: Ambulatory Visit | Attending: Radiation Oncology | Admitting: Radiation Oncology

## 2015-09-05 ENCOUNTER — Other Ambulatory Visit: Payer: Self-pay | Admitting: *Deleted

## 2015-09-05 DIAGNOSIS — E039 Hypothyroidism, unspecified: Secondary | ICD-10-CM | POA: Diagnosis not present

## 2015-09-05 DIAGNOSIS — Z9889 Other specified postprocedural states: Secondary | ICD-10-CM | POA: Diagnosis not present

## 2015-09-05 DIAGNOSIS — C049 Malignant neoplasm of floor of mouth, unspecified: Secondary | ICD-10-CM

## 2015-09-05 DIAGNOSIS — Z881 Allergy status to other antibiotic agents status: Secondary | ICD-10-CM | POA: Insufficient documentation

## 2015-09-05 DIAGNOSIS — Z87891 Personal history of nicotine dependence: Secondary | ICD-10-CM | POA: Diagnosis not present

## 2015-09-05 DIAGNOSIS — I10 Essential (primary) hypertension: Secondary | ICD-10-CM | POA: Diagnosis not present

## 2015-09-05 DIAGNOSIS — C01 Malignant neoplasm of base of tongue: Secondary | ICD-10-CM | POA: Diagnosis not present

## 2015-09-05 DIAGNOSIS — Z91018 Allergy to other foods: Secondary | ICD-10-CM | POA: Diagnosis not present

## 2015-09-05 DIAGNOSIS — E739 Lactose intolerance, unspecified: Secondary | ICD-10-CM | POA: Insufficient documentation

## 2015-09-05 DIAGNOSIS — E860 Dehydration: Secondary | ICD-10-CM

## 2015-09-05 DIAGNOSIS — Z79899 Other long term (current) drug therapy: Secondary | ICD-10-CM | POA: Insufficient documentation

## 2015-09-05 DIAGNOSIS — B370 Candidal stomatitis: Secondary | ICD-10-CM

## 2015-09-05 DIAGNOSIS — C029 Malignant neoplasm of tongue, unspecified: Secondary | ICD-10-CM | POA: Diagnosis present

## 2015-09-05 DIAGNOSIS — R1310 Dysphagia, unspecified: Secondary | ICD-10-CM | POA: Insufficient documentation

## 2015-09-05 DIAGNOSIS — Z90710 Acquired absence of both cervix and uterus: Secondary | ICD-10-CM | POA: Diagnosis not present

## 2015-09-05 DIAGNOSIS — Z931 Gastrostomy status: Secondary | ICD-10-CM

## 2015-09-05 DIAGNOSIS — I739 Peripheral vascular disease, unspecified: Secondary | ICD-10-CM | POA: Diagnosis not present

## 2015-09-05 DIAGNOSIS — R634 Abnormal weight loss: Secondary | ICD-10-CM

## 2015-09-05 DIAGNOSIS — E785 Hyperlipidemia, unspecified: Secondary | ICD-10-CM | POA: Insufficient documentation

## 2015-09-05 DIAGNOSIS — R633 Feeding difficulties: Secondary | ICD-10-CM | POA: Diagnosis not present

## 2015-09-05 HISTORY — DX: Peripheral vascular disease, unspecified: I73.9

## 2015-09-05 LAB — APTT: aPTT: <24 seconds — ABNORMAL LOW (ref 24–36)

## 2015-09-05 LAB — PROTIME-INR
INR: 1.08
PROTHROMBIN TIME: 14.2 s (ref 11.4–15.0)

## 2015-09-05 MED ORDER — LIDOCAINE HCL (PF) 1 % IJ SOLN
INTRAMUSCULAR | Status: DC | PRN
  Administered 2015-09-05: 3 mL

## 2015-09-05 MED ORDER — FENTANYL CITRATE (PF) 100 MCG/2ML IJ SOLN
INTRAMUSCULAR | Status: AC
  Filled 2015-09-05: qty 4

## 2015-09-05 MED ORDER — ONDANSETRON HCL 4 MG/2ML IJ SOLN: 4.0000 mg | INTRAMUSCULAR | Status: DC | PRN

## 2015-09-05 MED ORDER — JEVITY 1.2 CAL PO LIQD
237.0000 mL/h | ORAL | Status: DC
Start: 2015-09-05 — End: 2015-09-25

## 2015-09-05 MED ORDER — LIDOCAINE HCL (PF) 1 % IJ SOLN
INTRAMUSCULAR | Status: AC
  Filled 2015-09-05: qty 30

## 2015-09-05 MED ORDER — PISTON IRRIGATION SYRINGE MISC: 1.0000 | Status: DC

## 2015-09-05 MED ORDER — "DERMACEA DRAIN SPONGES 4""X4"" PADS": 1.0000 | MEDICATED_PAD | Status: DC

## 2015-09-05 MED ORDER — FENTANYL CITRATE (PF) 100 MCG/2ML IJ SOLN
INTRAMUSCULAR | Status: AC | PRN
  Administered 2015-09-05: 25 ug via INTRAVENOUS
  Administered 2015-09-05: 50 ug via INTRAVENOUS

## 2015-09-05 MED ORDER — JEVITY 1.2 CAL PO LIQD: 237.0000 mL | ORAL | Status: DC

## 2015-09-05 MED ORDER — HEPARIN SOD (PORK) LOCK FLUSH 100 UNIT/ML IV SOLN
INTRAVENOUS | Status: AC
  Filled 2015-09-05: qty 5

## 2015-09-05 MED ORDER — STERILE WATER FOR IRRIGATION IR SOLN: 1000.0000 mL | Freq: Once | Status: AC

## 2015-09-05 MED ORDER — HYDROCODONE-ACETAMINOPHEN 5-325 MG PO TABS: 1.0000 | ORAL_TABLET | ORAL | Status: DC | PRN

## 2015-09-05 MED ORDER — IOPAMIDOL (ISOVUE-300) INJECTION 61%: 10.0000 mL | Freq: Once | INTRAVENOUS | Status: DC | PRN

## 2015-09-05 MED ORDER — SODIUM CHLORIDE FLUSH 0.9 % IV SOLN
INTRAVENOUS | Status: AC
  Filled 2015-09-05: qty 10

## 2015-09-05 MED ORDER — "DERMACEA DRAIN SPONGES 4""X4"" PADS": 1.0000 | MEDICATED_PAD | Status: AC

## 2015-09-05 MED ORDER — SODIUM CHLORIDE 0.9 % IV SOLN
Freq: Once | INTRAVENOUS | Status: AC
  Administered 2015-09-05: 08:00:00 via INTRAVENOUS

## 2015-09-05 MED ORDER — PISTON IRRIGATION SYRINGE MISC: 1.0000 | Status: AC

## 2015-09-05 MED ORDER — MIDAZOLAM HCL 5 MG/5ML IJ SOLN
INTRAMUSCULAR | Status: AC | PRN
  Administered 2015-09-05 (×2): 1 mg via INTRAVENOUS
  Administered 2015-09-05: 0.5 mg via INTRAVENOUS

## 2015-09-05 MED ORDER — CEFAZOLIN SODIUM-DEXTROSE 2-4 GM/100ML-% IV SOLN
2.0000 g | INTRAVENOUS | Status: AC
  Administered 2015-09-05: 2 g via INTRAVENOUS
  Filled 2015-09-05: qty 100

## 2015-09-05 MED ORDER — HYDROMORPHONE HCL 1 MG/ML IJ SOLN: 1.0000 mg | INTRAMUSCULAR | Status: DC | PRN

## 2015-09-05 MED ORDER — MIDAZOLAM HCL 5 MG/5ML IJ SOLN
INTRAMUSCULAR | Status: AC
  Filled 2015-09-05: qty 10

## 2015-09-05 NOTE — Telephone Encounter (Signed)
RN rcvd call from [facility], RN in special procedures. G-tube placed today. Pt will need teaching performed by cancer center per Dr. Kathlene Cote.  I spoke with Dr. Rogue Bussing. Order obtained from md to initiate stat nutrition consult with [facility] dietary/lifestyle dept asap.

## 2015-09-05 NOTE — Procedures (Signed)
Interventional Radiology Procedure Note  Procedure:  Gastrostomy tube placement  Complications:  None  Estimated Blood Loss: < 10 mL  20 Fr bumper retention gastrostomy tube placed in body of stomach.  OK to use in 24 hours.  Venetia Night. Kathlene Cote, M.D Pager:  [phone]

## 2015-09-05 NOTE — Telephone Encounter (Signed)
Spoke with patient. She was informed that Dr. Rogue Bussing sent order to [facility] for mgmt of g-tube. Dietary consults also placed.  She expressed appreciation for callback.  Jevity/sterile water RX sent to [facility] mail order pt patient request. Will also send a 30 day supply to [facility] per patient request.  Syringes for irrigation & gauze pads rx sent to [facility] mail order.

## 2015-09-05 NOTE — H&P (Signed)
Chief Complaint: Patient was seen in consultation today for percutaneous gastrostomy tube placement at the request of Brahmanday,Govinda R  Referring Physician(s): Brahmanday,Govinda R  Patient Status: Outpatient  History of Present Illness: Rebecca Mcmillan is a 70 y.o. female with a history of carcinoma of the tongue. She is undergoing ongoing radiation therapy and chemotherapy. She has lost approximately 18 pounds in weight since diagnosis in April. Last week, she could not eat or drink for approximately 3 days due to oral thrush. She has now improved after treatment and can eat food and drink fluids. She has been referred for gastrostomy to try to gain weight and improve nutritional status while she undergoes further treatment.  Past Medical History  Diagnosis Date  . Hypertension   . Thyroid disease   . Hypothyroidism   . Hyperlipidemia   . Peripheral vascular disease ([place])   . Cancer ([place])     tongue    Past Surgical History  Procedure Laterality Date  . Abdominal hysterectomy    . Finger surgery Right     thumb  . Tongue biopsy N/A 07/02/2015    Procedure: TONGUE BIOPSY;  Surgeon: Beverly Gust, MD;  Location: [facility] ORS;  Service: ENT;  Laterality: N/A;  . Fine needle aspiration N/A 07/02/2015    Procedure: FINE NEEDLE ASPIRATION;  Surgeon: Beverly Gust, MD;  Location: [facility] ORS;  Service: ENT;  Laterality: N/A;  . Peripheral vascular catheterization N/A 07/15/2015    Procedure: Glori Luis Cath Insertion;  Surgeon: Algernon Huxley, MD;  Location: [facility];  Service: Cardiovascular;  Laterality: N/A;    Allergies: Garlic; Lactose intolerance (gi); and Tetracyclines & related  Medications: Prior to Admission medications   Medication Sig Start Date End Date Taking? Authorizing Provider  atenolol (TENORMIN) 25 MG tablet Take 25 mg by mouth daily.  05/16/15  Yes Historical Provider, MD  docusate sodium (COLACE) 100 MG capsule Take 100 mg by mouth 2 (two) times  daily.   Yes Historical Provider, MD  levothyroxine (SYNTHROID, LEVOTHROID) 50 MCG tablet  06/14/15  Yes Historical Provider, MD  lidocaine-prilocaine (EMLA) cream Apply 1 application topically as needed. 07/10/15  Yes Cammie Sickle, MD  losartan (COZAAR) 100 MG tablet  06/13/15  Yes Historical Provider, MD  lovastatin (MEVACOR) 40 MG tablet Take 40 mg by mouth every evening.  05/16/15  Yes Historical Provider, MD  ondansetron (ZOFRAN) 8 MG tablet TAKE 1 TABLET EVERY 8 HOURS AS NEEDED FOR NAUSEA AND VOMITING (START 3 DAYS AFTER CHEMOTHERAPY) 08/27/15  Yes Cammie Sickle, MD  polyethylene glycol (MIRALAX / GLYCOLAX) packet Take 17 g by mouth daily.   Yes Historical Provider, MD  prochlorperazine (COMPAZINE) 10 MG tablet Take 1 tablet (10 mg total) by mouth every 6 (six) hours as needed for nausea or vomiting. 08/21/15  Yes Cammie Sickle, MD  sucralfate (CARAFATE) 1 g tablet Take 1 tablet (1 g total) by mouth 4 (four) times daily -  with meals and at bedtime. 08/20/15  Yes Noreene Filbert, MD  fluconazole (DIFLUCAN) 100 MG tablet Take 1 tablet (100 mg total) by mouth daily. Patient not taking: Reported on 09/05/2015 08/26/15   Noreene Filbert, MD  oxyCODONE-acetaminophen (ROXICET) 5-325 MG tablet Take 1-2 tablets by mouth every 4 (four) hours as needed for severe pain. Patient not taking: Reported on 08/26/2015 07/02/15   Beverly Gust, MD     History reviewed. No pertinent family history.  Social History   Social History  . Marital  Status: Married    Spouse Name: N/A  . Number of Children: N/A  . Years of Education: N/A   Social History Main Topics  . Smoking status: Former Smoker -- 1.00 packs/day for 45 years    Types: Cigarettes    Quit date: 06/19/2015  . Smokeless tobacco: Never Used  . Alcohol Use: No  . Drug Use: No  . Sexual Activity: Not Asked   Other Topics Concern  . None   Social History Narrative    ECOG Status: 1 - Symptomatic but completely  ambulatory  Review of Systems: A 12 point ROS discussed and pertinent positives are indicated in the HPI above.  All other systems are negative.  Review of Systems  Constitutional: Negative.   HENT: Negative.   Respiratory: Negative.   Cardiovascular: Negative.   Gastrointestinal: Negative.   Genitourinary: Negative.   Musculoskeletal: Negative.   Neurological: Negative.      Vital Signs: BP 146/79 mmHg  Pulse 50  Temp(Src) 98.1 F (36.7 C) (Oral)  Resp 99  Wt 86 lb (39.009 kg)  Physical Exam  Constitutional: She is oriented to person, place, and time. No distress.  Neck: Neck supple. No JVD present. No tracheal deviation present. No thyromegaly present.  Large, protuberant left cervical lymph node at angle of mandible.  Cardiovascular: Normal rate, regular rhythm and normal heart sounds.  Exam reveals no gallop and no friction rub.   No murmur heard. Pulmonary/Chest: Effort normal and breath sounds normal. No stridor. No respiratory distress. She has no wheezes. She has no rales.  Abdominal: Soft. Bowel sounds are normal. She exhibits no distension and no mass. There is no tenderness. There is no rebound and no guarding.  Musculoskeletal: She exhibits no edema or tenderness.  Lymphadenopathy:    She has cervical adenopathy.  Neurological: She is alert and oriented to person, place, and time.  Skin: Skin is warm and dry. No rash noted. She is not diaphoretic. No erythema.  Nursing note and vitals reviewed.   Mallampati Score:  MD Evaluation Airway: WNL Heart: WNL Abdomen: WNL Chest/ Lungs: WNL ASA  Classification: 3 Mallampati/Airway Score: One  Imaging: No results found.  Labs:  CBC:  Recent Labs  08/14/15 0838 08/21/15 0814 08/28/15 0814 09/04/15 0812  WBC 5.1 4.6 4.3 3.0*  HGB 10.5* 10.1* 9.8* 8.4*  HCT 29.2* 27.8* 27.3* 23.2*  PLT 167 148* 112* 79*    COAGS:  Recent Labs  09/05/15 0717  INR 1.08  APTT <24*    BMP:  Recent Labs   08/14/15 0838 08/21/15 0814 08/28/15 0814 09/04/15 0812  NA 132* 131* 131* 133*  K 3.9 4.0 4.2 3.6  CL 101 101 99* 102  CO2 24 25 21* 25  GLUCOSE 108* 101* 79 125*  BUN 12 15 35* 11  CALCIUM 8.6* 8.5* 8.5* 8.4*  CREATININE 0.71 0.75 0.84 0.72  GFRNONAA >60 >60 >60 >60  GFRAA >60 >60 >60 >60    LIVER FUNCTION TESTS:  Recent Labs  07/29/15 0815 08/05/15 0827 08/14/15 0838 08/21/15 0814  BILITOT 0.7 0.9 0.4 0.5  AST '23 16 16 ' 14*  ALT '14 17 18 15  ' ALKPHOS 73 62 72 72  PROT 7.1 6.6 6.4* 6.7  ALBUMIN 4.2 3.7 3.6 3.6     Assessment and Plan:  For percutaneous gastrostomy tube placement today. The patient drank thin liquid barium last night in order to help opacify the colon prior to the procedure. Airway appears appropriate for moderate sedation as well  as lace met of a pull-through gastrostomy. After placement, we will contact the [facility] to make sure she is set up for education on tube use and maintenance.  Risks and benefits discussed with the patient including, but not limited to the need for a barium enema during the procedure, bleeding, infection, peritonitis, or damage to adjacent structures. All of the patient's questions were answered, patient is agreeable to proceed. Consent signed and in chart.  Thank you for this interesting consult.  I greatly enjoyed meeting WANNA GULLY and look forward to participating in their care.  A copy of this report was sent to the requesting provider on this date.  Electronically SignedAletta Edouard T 09/05/2015, 8:10 AM   I spent a total of 30 Minutes in face to face in clinical consultation, greater than 50% of which was counseling/coordinating care for gastrostomy tube placement.

## 2015-09-06 ENCOUNTER — Telehealth: Payer: Self-pay | Admitting: *Deleted

## 2015-09-06 ENCOUNTER — Ambulatory Visit: Payer: Commercial Managed Care - HMO

## 2015-09-06 NOTE — Telephone Encounter (Signed)
This message is for [employer], Therapist, sports.  Please call back in reference to referral sent in for this patient.  Needs specific information clariied.

## 2015-09-06 NOTE — Telephone Encounter (Signed)
This message if for Nira Conn, Therapist, sports.  Please call regarding oral feedings.  Needs to know which food and feeding amount.

## 2015-09-09 ENCOUNTER — Telehealth: Payer: Self-pay | Admitting: *Deleted

## 2015-09-09 ENCOUNTER — Ambulatory Visit
Admission: RE | Admit: 2015-09-09 | Discharge: 2015-09-09 | Disposition: A | Discharge status: Home / Self Care | Payer: Commercial Managed Care - HMO | Source: Ambulatory Visit | Attending: Radiation Oncology | Admitting: Radiation Oncology

## 2015-09-09 ENCOUNTER — Ambulatory Visit: Payer: Commercial Managed Care - HMO

## 2015-09-09 DIAGNOSIS — Z510 Encounter for antineoplastic radiation therapy: Secondary | ICD-10-CM | POA: Diagnosis not present

## 2015-09-09 DIAGNOSIS — I10 Essential (primary) hypertension: Secondary | ICD-10-CM | POA: Diagnosis not present

## 2015-09-09 DIAGNOSIS — R252 Cramp and spasm: Secondary | ICD-10-CM | POA: Diagnosis not present

## 2015-09-09 DIAGNOSIS — R634 Abnormal weight loss: Secondary | ICD-10-CM | POA: Diagnosis not present

## 2015-09-09 DIAGNOSIS — F17210 Nicotine dependence, cigarettes, uncomplicated: Secondary | ICD-10-CM | POA: Diagnosis not present

## 2015-09-09 DIAGNOSIS — C040 Malignant neoplasm of anterior floor of mouth: Secondary | ICD-10-CM | POA: Diagnosis not present

## 2015-09-09 DIAGNOSIS — E039 Hypothyroidism, unspecified: Secondary | ICD-10-CM | POA: Diagnosis not present

## 2015-09-09 DIAGNOSIS — C049 Malignant neoplasm of floor of mouth, unspecified: Secondary | ICD-10-CM | POA: Diagnosis not present

## 2015-09-09 DIAGNOSIS — E785 Hyperlipidemia, unspecified: Secondary | ICD-10-CM | POA: Diagnosis not present

## 2015-09-09 DIAGNOSIS — C770 Secondary and unspecified malignant neoplasm of lymph nodes of head, face and neck: Secondary | ICD-10-CM | POA: Diagnosis not present

## 2015-09-09 NOTE — Telephone Encounter (Signed)
States she has not heard from anyone about caring for her tube or the nutritionist either. Please follow up on this for her.

## 2015-09-09 NOTE — Telephone Encounter (Signed)
Faxed order for feeding supplement.

## 2015-09-10 ENCOUNTER — Ambulatory Visit: Payer: Commercial Managed Care - HMO

## 2015-09-10 DIAGNOSIS — E039 Hypothyroidism, unspecified: Secondary | ICD-10-CM | POA: Diagnosis not present

## 2015-09-10 DIAGNOSIS — F17210 Nicotine dependence, cigarettes, uncomplicated: Secondary | ICD-10-CM | POA: Diagnosis not present

## 2015-09-10 DIAGNOSIS — Z510 Encounter for antineoplastic radiation therapy: Secondary | ICD-10-CM | POA: Diagnosis not present

## 2015-09-10 DIAGNOSIS — R634 Abnormal weight loss: Secondary | ICD-10-CM | POA: Diagnosis not present

## 2015-09-10 DIAGNOSIS — C770 Secondary and unspecified malignant neoplasm of lymph nodes of head, face and neck: Secondary | ICD-10-CM | POA: Diagnosis not present

## 2015-09-10 DIAGNOSIS — E785 Hyperlipidemia, unspecified: Secondary | ICD-10-CM | POA: Diagnosis not present

## 2015-09-10 DIAGNOSIS — C049 Malignant neoplasm of floor of mouth, unspecified: Secondary | ICD-10-CM | POA: Diagnosis not present

## 2015-09-10 DIAGNOSIS — I10 Essential (primary) hypertension: Secondary | ICD-10-CM | POA: Diagnosis not present

## 2015-09-10 DIAGNOSIS — C040 Malignant neoplasm of anterior floor of mouth: Secondary | ICD-10-CM | POA: Diagnosis not present

## 2015-09-10 DIAGNOSIS — R252 Cramp and spasm: Secondary | ICD-10-CM | POA: Diagnosis not present

## 2015-09-11 ENCOUNTER — Inpatient Hospital Stay: Payer: Commercial Managed Care - HMO

## 2015-09-11 ENCOUNTER — Ambulatory Visit: Payer: Commercial Managed Care - HMO | Admitting: Radiation Oncology

## 2015-09-11 ENCOUNTER — Inpatient Hospital Stay
Admission: RE | Admit: 2015-09-11 | Payer: Commercial Managed Care - HMO | Source: Ambulatory Visit | Admitting: Radiation Oncology

## 2015-09-11 ENCOUNTER — Other Ambulatory Visit: Payer: Self-pay | Admitting: Internal Medicine

## 2015-09-11 ENCOUNTER — Inpatient Hospital Stay (HOSPITAL_BASED_OUTPATIENT_CLINIC_OR_DEPARTMENT_OTHER): Payer: Commercial Managed Care - HMO | Admitting: Internal Medicine

## 2015-09-11 ENCOUNTER — Ambulatory Visit: Payer: Commercial Managed Care - HMO

## 2015-09-11 ENCOUNTER — Ambulatory Visit
Admission: RE | Admit: 2015-09-11 | Discharge: 2015-09-11 | Disposition: A | Discharge status: Home / Self Care | Payer: Commercial Managed Care - HMO | Source: Ambulatory Visit | Attending: Radiation Oncology | Admitting: Radiation Oncology

## 2015-09-11 VITALS — BP 128/82 | HR 69 | Temp 94.4°F | Resp 18 | Wt 86.6 lb

## 2015-09-11 DIAGNOSIS — F17210 Nicotine dependence, cigarettes, uncomplicated: Secondary | ICD-10-CM | POA: Diagnosis not present

## 2015-09-11 DIAGNOSIS — R252 Cramp and spasm: Secondary | ICD-10-CM | POA: Diagnosis not present

## 2015-09-11 DIAGNOSIS — I10 Essential (primary) hypertension: Secondary | ICD-10-CM | POA: Diagnosis not present

## 2015-09-11 DIAGNOSIS — E876 Hypokalemia: Secondary | ICD-10-CM | POA: Insufficient documentation

## 2015-09-11 DIAGNOSIS — C770 Secondary and unspecified malignant neoplasm of lymph nodes of head, face and neck: Secondary | ICD-10-CM | POA: Diagnosis not present

## 2015-09-11 DIAGNOSIS — Z923 Personal history of irradiation: Secondary | ICD-10-CM

## 2015-09-11 DIAGNOSIS — C049 Malignant neoplasm of floor of mouth, unspecified: Secondary | ICD-10-CM

## 2015-09-11 DIAGNOSIS — D696 Thrombocytopenia, unspecified: Secondary | ICD-10-CM

## 2015-09-11 DIAGNOSIS — Z510 Encounter for antineoplastic radiation therapy: Secondary | ICD-10-CM | POA: Diagnosis not present

## 2015-09-11 DIAGNOSIS — D649 Anemia, unspecified: Secondary | ICD-10-CM

## 2015-09-11 DIAGNOSIS — Z87891 Personal history of nicotine dependence: Secondary | ICD-10-CM

## 2015-09-11 DIAGNOSIS — B370 Candidal stomatitis: Secondary | ICD-10-CM

## 2015-09-11 DIAGNOSIS — Z79899 Other long term (current) drug therapy: Secondary | ICD-10-CM

## 2015-09-11 DIAGNOSIS — Z5111 Encounter for antineoplastic chemotherapy: Secondary | ICD-10-CM | POA: Diagnosis not present

## 2015-09-11 DIAGNOSIS — R634 Abnormal weight loss: Secondary | ICD-10-CM | POA: Diagnosis not present

## 2015-09-11 DIAGNOSIS — E860 Dehydration: Secondary | ICD-10-CM

## 2015-09-11 DIAGNOSIS — C040 Malignant neoplasm of anterior floor of mouth: Secondary | ICD-10-CM | POA: Diagnosis not present

## 2015-09-11 DIAGNOSIS — Z931 Gastrostomy status: Secondary | ICD-10-CM

## 2015-09-11 DIAGNOSIS — E039 Hypothyroidism, unspecified: Secondary | ICD-10-CM | POA: Diagnosis not present

## 2015-09-11 DIAGNOSIS — E785 Hyperlipidemia, unspecified: Secondary | ICD-10-CM | POA: Diagnosis not present

## 2015-09-11 LAB — COMPREHENSIVE METABOLIC PANEL
ALT: 15 U/L (ref 14–54)
AST: 18 U/L (ref 15–41)
Albumin: 3.3 g/dL — ABNORMAL LOW (ref 3.5–5.0)
Alkaline Phosphatase: 69 U/L (ref 38–126)
Anion gap: 6 (ref 5–15)
BUN: 13 mg/dL (ref 6–20)
CO2: 24 mmol/L (ref 22–32)
Calcium: 8 mg/dL — ABNORMAL LOW (ref 8.9–10.3)
Chloride: 101 mmol/L (ref 101–111)
Creatinine, Ser: 0.66 mg/dL (ref 0.44–1.00)
GFR calc Af Amer: >60 mL/min (ref >60)
GFR calc non Af Amer: >60 mL/min (ref >60)
Glucose, Bld: 133 mg/dL — ABNORMAL HIGH (ref 65–99)
Potassium: 3 mmol/L — ABNORMAL LOW (ref 3.5–5.1)
Sodium: 131 mmol/L — ABNORMAL LOW (ref 135–145)
Total Bilirubin: 0.6 mg/dL (ref 0.3–1.2)
Total Protein: 5.9 g/dL — ABNORMAL LOW (ref 6.5–8.1)

## 2015-09-11 LAB — CBC WITH DIFFERENTIAL/PLATELET
BASOS ABS: 0 10*3/uL (ref 0–0.1)
Basophils Relative: 1 %
EOS ABS: 0 10*3/uL (ref 0–0.7)
EOS PCT: 1 %
HCT: 19.3 % — ABNORMAL LOW (ref 35.0–47.0)
Hemoglobin: 7.1 g/dL — ABNORMAL LOW (ref 12.0–16.0)
LYMPHS PCT: 20 %
Lymphs Abs: 0.4 10*3/uL — ABNORMAL LOW (ref 1.0–3.6)
MCH: 33.5 pg (ref 26.0–34.0)
MCHC: 36.7 g/dL — ABNORMAL HIGH (ref 32.0–36.0)
MCV: 91.2 fL (ref 80.0–100.0)
Monocytes Absolute: 0.2 10*3/uL (ref 0.2–0.9)
Monocytes Relative: 12 %
Neutro Abs: 1.3 10*3/uL — ABNORMAL LOW (ref 1.4–6.5)
Neutrophils Relative %: 66 %
PLATELETS: 70 10*3/uL — AB (ref 150–440)
RBC: 2.12 MIL/uL — AB (ref 3.80–5.20)
RDW: 14.4 % (ref 11.5–14.5)
WBC: 2 10*3/uL — AB (ref 3.6–11.0)

## 2015-09-11 LAB — MAGNESIUM: Magnesium: 1.6 mg/dL — ABNORMAL LOW (ref 1.7–2.4)

## 2015-09-11 MED ORDER — SODIUM CHLORIDE 0.9 % IV SOLN
Freq: Once | INTRAVENOUS | Status: AC
  Administered 2015-09-11: 10:00:00 via INTRAVENOUS
  Filled 2015-09-11: qty 20

## 2015-09-11 MED ORDER — HEPARIN SOD (PORK) LOCK FLUSH 100 UNIT/ML IV SOLN
500.0000 [IU] | Freq: Once | INTRAVENOUS | Status: AC
  Administered 2015-09-11: 500 [IU] via INTRAVENOUS

## 2015-09-11 MED ORDER — SODIUM CHLORIDE 0.9 % IV SOLN
INTRAVENOUS | Status: DC | PRN
  Administered 2015-09-11: 10:00:00 via INTRAVENOUS
  Filled 2015-09-11: qty 1000

## 2015-09-11 MED ORDER — POTASSIUM CHLORIDE 20 MEQ/15ML (10%) PO SOLN: 20.0000 meq | Freq: Two times a day (BID) | ORAL | Status: DC

## 2015-09-11 NOTE — Progress Notes (Signed)
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  Patient in the interim had a PEG tube placed.  Patient has finished 6 weekly treatments of cisplatin along with radiation- so far.  No significant  Reduction of the   Left mandibular mass noted.   She denies any significant pain.   She  Has not started PEG tube feeds yet.  Has difficulty swallowing. Poor po intake.   ROS: A complete 10 point review of system is done which is negative except mentioned above in history of present illness  MEDICAL HISTORY:  Past Medical History  Diagnosis Date  . Hypertension   . Thyroid disease   . Hypothyroidism   . Hyperlipidemia   . Peripheral vascular disease ([place])   . Cancer ([place])     tongue    SURGICAL HISTORY: None  SOCIAL HISTORY: Patient previously used to work in nursing homes. Denies any alcohol. Smokes a pack of Celexa day for 45 years lives with her husband in [place]. No children.  Social History   Social History  . Marital Status: Married   Spouse Name: N/A  . Number of Children: N/A  . Years of Education: N/A   Occupational History  . Not on file.   Social History Main Topics  . Smoking status: Former Smoker -- 1.00 packs/day for 45 years    Types: Cigarettes    Quit date: 06/19/2015  . Smokeless tobacco: Never Used  . Alcohol Use: No  . Drug Use: No  . Sexual Activity: Not on file   Other Topics Concern  . Not on file   Social History Narrative    FAMILY HISTORY:no family history of head and neck cancer.   ALLERGIES:  is allergic to garlic; lactose intolerance (gi); and tetracyclines & related.  MEDICATIONS:  Current Outpatient Prescriptions  Medication Sig Dispense Refill  . atenolol (TENORMIN) 25 MG tablet Take 25 mg by mouth daily.     Marland Kitchen docusate sodium (COLACE) 100 MG capsule Take 100 mg by mouth 2 (two) times daily.    . feeding supplement (JEVITY 1.2 CAL) 237 mL in sterile water for irrigation 237 mL Give 237 mL/hr by tube continuous. 1000 mL 12  . fluconazole (DIFLUCAN) 100 MG tablet Take 1 tablet (100 mg total) by mouth daily. 7 tablet 0  . Gauze Pads & Dressings (DERMACEA DRAIN SPONGES) 4"X4" PADS 1 each by Does not apply route 1 day or 1 dose. 50 each 0  . Irrigation Supplies (PISTON IRRIGATION SYRINGE) MISC 1  Syringe by Does not apply route 1 day or 1 dose. 30 each 0  . levothyroxine (SYNTHROID, LEVOTHROID) 50 MCG tablet     . lidocaine-prilocaine (EMLA) cream Apply 1 application topically as needed. 30 g 6  . losartan (COZAAR) 100 MG tablet     . lovastatin (MEVACOR) 40 MG tablet Take 40 mg by mouth every evening.     . Nutritional Supplements (FEEDING SUPPLEMENT, JEVITY 1.2 CAL,) LIQD Place 237 mLs into feeding tube continuous. May use Jevity supplemental feeding 4xs a day through j-peg tube 1422 mL 12  . ondansetron (ZOFRAN) 8 MG tablet TAKE 1 TABLET EVERY 8 HOURS AS NEEDED FOR NAUSEA AND VOMITING (START 3 DAYS AFTER CHEMOTHERAPY) 40 tablet 0  . oxyCODONE-acetaminophen (ROXICET) 5-325 MG tablet  Take 1-2 tablets by mouth every 4 (four) hours as needed for severe pain. 40 tablet 0  . polyethylene glycol (MIRALAX / GLYCOLAX) packet Take 17 g by mouth daily.    . prochlorperazine (COMPAZINE) 10 MG tablet Take 1 tablet (10 mg total) by mouth every 6 (six) hours as needed for nausea or vomiting. 30 tablet 0  . sucralfate (CARAFATE) 1 g tablet Take 1 tablet (1 g total) by mouth 4 (four) times daily -  with meals and at bedtime. 90 tablet 3  . Water For Irrigation, Sterile (STERILE WATER FOR IRRIGATION) Irrigate with 1,000 mLs as directed once. 1000 mL 0  . potassium chloride 20 MEQ/15ML (10%) SOLN Take 15 mLs (20 mEq total) by mouth 2 (two) times daily. Thru PEG tube-starting tomorrow. 450 mL 0   Current Facility-Administered Medications  Medication Dose Route Frequency Provider Last Rate Last Dose  . 0.9 %  sodium chloride infusion   Intravenous PRN Cammie Sickle, MD   Stopped at 09/11/15 1050      .  PHYSICAL EXAMINATION: ECOG PERFORMANCE STATUS: 1 - Symptomatic but completely ambulatory  Filed Vitals:   09/11/15 0857  BP: 128/82  Pulse: 69  Temp: 94.4 F (34.7 C)  Resp: 18   Filed Weights   09/11/15 0857  Weight: 86 lb 10.3 oz (39.3 kg)    GENERAL: Thin built moderately nourished Caucasian female patient anxious. Alert, no distress and comfortable. She is Accompanied by her husband.  EYES: no pallor or icterus OROPHARYNX: no thrush or ulceration; poor dentition. No obvious masses noted in the oral cavity. NECK: supple, 5-6cm mass/firm- submandibular mass. Non-tender.  LYMPH:  no palpable lymphadenopathy xillary or inguinal regions LUNGS: Decreased breath sounds bilaterally No wheeze or crackles; barrel chested.  HEART/CVS: regular rate & rhythm and no murmurs; No lower extremity edema ABDOMEN: abdomen soft, non-tender and normal bowel sounds Musculoskeletal:no cyanosis of digits and no clubbing  PSYCH: alert & oriented x 3 with fluent speech NEURO: no focal  motor/sensory deficits SKIN:  no rashes or significant lesions  LABORATORY DATA:  I have reviewed the data as listed Lab Results  Component Value Date   WBC 2.0* 09/11/2015   HGB 7.1* 09/11/2015   HCT 19.3* 09/11/2015   MCV 91.2 09/11/2015   PLT 70* 09/11/2015    Recent Labs  08/14/15 0838 08/21/15 0814 08/28/15 0814 09/04/15 0812 09/11/15 0811  NA 132* 131* 131* 133* 131*  K 3.9 4.0 4.2 3.6 3.0*  CL 101 101 99* 102 101  CO2 24 25 21* 25 24  GLUCOSE 108* 101* 79 125* 133*  BUN 12 15 35* 11 13  CREATININE 0.71 0.75 0.84 0.72 0.66  CALCIUM 8.6* 8.5* 8.5* 8.4* 8.0*  GFRNONAA >60 >60 >60 >60 >60  GFRAA >60 >60 >60 >60 >60  PROT 6.4* 6.7  --   --  5.9*  ALBUMIN 3.6 3.6  --   --  3.3*  AST 16 14*  --   --  18  ALT 18 15  --   --  15  ALKPHOS 72 72  --   --  69  BILITOT 0.4 0.5  --   --  0.6    ASSESSMENT & PLAN:  Cancer of floor of mouth ([facility])  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 Unresectable- Currently on concurrent chemoradiation status post 6 cycles of chemotherapy. No significant clinical improvement noted.  Talking to Dr. Donella Stade-  Plan to give a break from radiation  Next week;  And then re-plan  2 -3 more weeks  # HOLD CHEMO today;  Blood counts are borderline/ patient's feeling poorly.   # Hypokalemia/ hypomagnesemia/  Dehydration-  Plan IV fluids;   Electrolyte- supplementation.  # anemia-  Hemoglobin 7.1;  Monitor for now.  Likely from chemotherapy  #   For nutrition weight loss-  Status post PEG tube placement.   New order for tube feeds given.     # patient will get weekly CBC BMP and magnesium/cisplatin; follow-up with me in 2 weeks.    Cammie Sickle, MD 09/11/2015 5:56 PM

## 2015-09-11 NOTE — Progress Notes (Signed)
Patient had feeding tube placed last Thursday.  States she is a little sore in her abdomen but no other complaints.  Home health is scheduled to come out regarding her feedings.  Has appointment with nutritionist on 09-24-15.

## 2015-09-11 NOTE — Assessment & Plan Note (Addendum)
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 Unresectable- Currently on concurrent chemoradiation status post 6 cycles of chemotherapy. No significant clinical improvement noted.  Talked to Dr. Donella Stade-  Plan to give a break from radiation  Next week;  And then re-plan  2 -3 more weeks  # HOLD CHEMO today;  Blood counts are borderline/ patient's feeling poorly.   # Hypokalemia/ hypomagnesemia/  Dehydration-  Plan IV fluids;   Electrolyte- supplementation.  # anemia-  Hemoglobin 7.1;  Monitor for now.  Likely from chemotherapy  #   For nutrition weight loss-  Status post PEG tube placement.   New order for tube feeds given.

## 2015-09-12 ENCOUNTER — Ambulatory Visit: Payer: Commercial Managed Care - HMO

## 2015-09-12 ENCOUNTER — Other Ambulatory Visit: Payer: Self-pay | Admitting: *Deleted

## 2015-09-12 ENCOUNTER — Other Ambulatory Visit: Payer: Self-pay | Admitting: Internal Medicine

## 2015-09-12 ENCOUNTER — Telehealth: Payer: Self-pay | Admitting: *Deleted

## 2015-09-12 DIAGNOSIS — R252 Cramp and spasm: Secondary | ICD-10-CM | POA: Diagnosis not present

## 2015-09-12 DIAGNOSIS — C049 Malignant neoplasm of floor of mouth, unspecified: Secondary | ICD-10-CM | POA: Diagnosis not present

## 2015-09-12 DIAGNOSIS — C040 Malignant neoplasm of anterior floor of mouth: Secondary | ICD-10-CM | POA: Diagnosis not present

## 2015-09-12 DIAGNOSIS — E785 Hyperlipidemia, unspecified: Secondary | ICD-10-CM | POA: Diagnosis not present

## 2015-09-12 DIAGNOSIS — I10 Essential (primary) hypertension: Secondary | ICD-10-CM | POA: Diagnosis not present

## 2015-09-12 DIAGNOSIS — C770 Secondary and unspecified malignant neoplasm of lymph nodes of head, face and neck: Secondary | ICD-10-CM | POA: Diagnosis not present

## 2015-09-12 DIAGNOSIS — R634 Abnormal weight loss: Secondary | ICD-10-CM | POA: Diagnosis not present

## 2015-09-12 DIAGNOSIS — E039 Hypothyroidism, unspecified: Secondary | ICD-10-CM | POA: Diagnosis not present

## 2015-09-12 DIAGNOSIS — F17210 Nicotine dependence, cigarettes, uncomplicated: Secondary | ICD-10-CM | POA: Diagnosis not present

## 2015-09-12 DIAGNOSIS — C029 Malignant neoplasm of tongue, unspecified: Secondary | ICD-10-CM | POA: Diagnosis not present

## 2015-09-12 DIAGNOSIS — Z510 Encounter for antineoplastic radiation therapy: Secondary | ICD-10-CM | POA: Diagnosis not present

## 2015-09-12 DIAGNOSIS — E876 Hypokalemia: Secondary | ICD-10-CM

## 2015-09-12 MED ORDER — POTASSIUM CHLORIDE 20 MEQ PO PACK: 20.0000 meq | PACK | Freq: Two times a day (BID) | ORAL | Status: DC

## 2015-09-12 NOTE — Telephone Encounter (Signed)
2 weeks supply Potassium 20 meq packets twice daily to equal 40 meq sent to pt's pharmacy. Pt may disolve the potassium in water and drink it directly or put the medication through her peg tube via dissolved w/ water.  msg left with [facility] of medication change

## 2015-09-12 NOTE — Telephone Encounter (Signed)
rcvd msg from answering service. [place] contacted cancer center on 09/11/15 at 1249 pm. Msg left "pharmacy has question on script, insurance deductible is charging $300.00 please call back."

## 2015-09-12 NOTE — Telephone Encounter (Signed)
Potassium Chloride liquid has a really high deductible and patient's portion is $272.  Wants to know if he can substitute K+ 20 meq and have patient dissolve it in a small glass of water.

## 2015-09-13 ENCOUNTER — Ambulatory Visit: Payer: Commercial Managed Care - HMO

## 2015-09-13 NOTE — Addendum Note (Signed)
Addended by: Betti Cruz on: 09/13/2015 10:27 AM   Modules accepted: Orders, Medications

## 2015-09-14 DIAGNOSIS — C01 Malignant neoplasm of base of tongue: Secondary | ICD-10-CM | POA: Insufficient documentation

## 2015-09-14 DIAGNOSIS — F17210 Nicotine dependence, cigarettes, uncomplicated: Secondary | ICD-10-CM | POA: Insufficient documentation

## 2015-09-14 DIAGNOSIS — Z510 Encounter for antineoplastic radiation therapy: Secondary | ICD-10-CM | POA: Insufficient documentation

## 2015-09-16 ENCOUNTER — Other Ambulatory Visit: Payer: Self-pay | Admitting: *Deleted

## 2015-09-16 ENCOUNTER — Telehealth: Payer: Self-pay | Admitting: *Deleted

## 2015-09-16 ENCOUNTER — Ambulatory Visit: Payer: Commercial Managed Care - HMO

## 2015-09-16 DIAGNOSIS — C049 Malignant neoplasm of floor of mouth, unspecified: Secondary | ICD-10-CM

## 2015-09-16 DIAGNOSIS — K1379 Other lesions of oral mucosa: Secondary | ICD-10-CM

## 2015-09-16 MED ORDER — NYSTATIN 100000 UNIT/ML MT SUSP: 5.0000 mL | Freq: Four times a day (QID) | OROMUCOSAL | Status: DC

## 2015-09-16 NOTE — Telephone Encounter (Signed)
Pt reported white patches in mouth. Contact patient today to Inform patient that nystatin oral suspension sent to [facility].

## 2015-09-18 ENCOUNTER — Inpatient Hospital Stay: Payer: Commercial Managed Care - HMO

## 2015-09-18 ENCOUNTER — Ambulatory Visit: Payer: Commercial Managed Care - HMO

## 2015-09-18 ENCOUNTER — Other Ambulatory Visit: Payer: Self-pay | Admitting: *Deleted

## 2015-09-18 ENCOUNTER — Inpatient Hospital Stay: Payer: Commercial Managed Care - HMO | Attending: Internal Medicine

## 2015-09-18 ENCOUNTER — Other Ambulatory Visit: Payer: Self-pay | Admitting: Internal Medicine

## 2015-09-18 VITALS — BP 134/50 | HR 54 | Temp 96.0°F | Resp 20

## 2015-09-18 DIAGNOSIS — E039 Hypothyroidism, unspecified: Secondary | ICD-10-CM | POA: Diagnosis not present

## 2015-09-18 DIAGNOSIS — Z79899 Other long term (current) drug therapy: Secondary | ICD-10-CM | POA: Insufficient documentation

## 2015-09-18 DIAGNOSIS — E876 Hypokalemia: Secondary | ICD-10-CM | POA: Insufficient documentation

## 2015-09-18 DIAGNOSIS — D6481 Anemia due to antineoplastic chemotherapy: Secondary | ICD-10-CM | POA: Diagnosis not present

## 2015-09-18 DIAGNOSIS — C7989 Secondary malignant neoplasm of other specified sites: Secondary | ICD-10-CM | POA: Insufficient documentation

## 2015-09-18 DIAGNOSIS — T451X5A Adverse effect of antineoplastic and immunosuppressive drugs, initial encounter: Principal | ICD-10-CM

## 2015-09-18 DIAGNOSIS — E785 Hyperlipidemia, unspecified: Secondary | ICD-10-CM | POA: Diagnosis not present

## 2015-09-18 DIAGNOSIS — I739 Peripheral vascular disease, unspecified: Secondary | ICD-10-CM | POA: Diagnosis not present

## 2015-09-18 DIAGNOSIS — E860 Dehydration: Secondary | ICD-10-CM

## 2015-09-18 DIAGNOSIS — Z5111 Encounter for antineoplastic chemotherapy: Secondary | ICD-10-CM | POA: Insufficient documentation

## 2015-09-18 DIAGNOSIS — R634 Abnormal weight loss: Secondary | ICD-10-CM | POA: Diagnosis not present

## 2015-09-18 DIAGNOSIS — F17210 Nicotine dependence, cigarettes, uncomplicated: Secondary | ICD-10-CM | POA: Insufficient documentation

## 2015-09-18 DIAGNOSIS — I10 Essential (primary) hypertension: Secondary | ICD-10-CM | POA: Diagnosis not present

## 2015-09-18 DIAGNOSIS — Z931 Gastrostomy status: Secondary | ICD-10-CM | POA: Diagnosis not present

## 2015-09-18 DIAGNOSIS — C049 Malignant neoplasm of floor of mouth, unspecified: Secondary | ICD-10-CM | POA: Insufficient documentation

## 2015-09-18 LAB — CBC WITH DIFFERENTIAL/PLATELET
BASOS ABS: 0 10*3/uL (ref 0–0.1)
Basophils Relative: 1 %
EOS ABS: 0 10*3/uL (ref 0–0.7)
EOS PCT: 1 %
HCT: 17.3 % — ABNORMAL LOW (ref 35.0–47.0)
HEMOGLOBIN: 6.2 g/dL — AB (ref 12.0–16.0)
LYMPHS ABS: 0.5 10*3/uL — AB (ref 1.0–3.6)
LYMPHS PCT: 23 %
MCH: 34.2 pg — ABNORMAL HIGH (ref 26.0–34.0)
MCHC: 36.1 g/dL — ABNORMAL HIGH (ref 32.0–36.0)
MCV: 94.6 fL (ref 80.0–100.0)
Monocytes Absolute: 0.4 10*3/uL (ref 0.2–0.9)
Monocytes Relative: 18 %
NEUTROS ABS: 1.2 10*3/uL — AB (ref 1.4–6.5)
NEUTROS PCT: 57 %
PLATELETS: 105 10*3/uL — AB (ref 150–440)
RBC: 1.83 MIL/uL — AB (ref 3.80–5.20)
RDW: 15 % — ABNORMAL HIGH (ref 11.5–14.5)
WBC: 2 10*3/uL — AB (ref 3.6–11.0)

## 2015-09-18 LAB — COMPREHENSIVE METABOLIC PANEL
ALT: 11 U/L — AB (ref 14–54)
AST: 18 U/L (ref 15–41)
Albumin: 3.2 g/dL — ABNORMAL LOW (ref 3.5–5.0)
Alkaline Phosphatase: 64 U/L (ref 38–126)
Anion gap: 4 — ABNORMAL LOW (ref 5–15)
BUN: 16 mg/dL (ref 6–20)
CHLORIDE: 103 mmol/L (ref 101–111)
CO2: 25 mmol/L (ref 22–32)
CREATININE: 0.7 mg/dL (ref 0.44–1.00)
Calcium: 8 mg/dL — ABNORMAL LOW (ref 8.9–10.3)
GFR calc Af Amer: >60 mL/min (ref >60)
GFR calc non Af Amer: >60 mL/min (ref >60)
GLUCOSE: 126 mg/dL — AB (ref 65–99)
Potassium: 3.4 mmol/L — ABNORMAL LOW (ref 3.5–5.1)
SODIUM: 132 mmol/L — AB (ref 135–145)
Total Bilirubin: 0.4 mg/dL (ref 0.3–1.2)
Total Protein: 5.8 g/dL — ABNORMAL LOW (ref 6.5–8.1)

## 2015-09-18 LAB — PREPARE RBC (CROSSMATCH)

## 2015-09-18 LAB — ABO/RH: ABO/RH(D): B POS

## 2015-09-18 LAB — MAGNESIUM: Magnesium: 1.7 mg/dL (ref 1.7–2.4)

## 2015-09-18 MED ORDER — ACETAMINOPHEN 325 MG PO TABS
650.0000 mg | ORAL_TABLET | Freq: Once | ORAL | Status: AC
  Administered 2015-09-18: 650 mg via ORAL
  Filled 2015-09-18: qty 2

## 2015-09-18 MED ORDER — SODIUM CHLORIDE 0.9 % IV SOLN
250.0000 mL | Freq: Once | INTRAVENOUS | Status: AC
  Administered 2015-09-18: 250 mL via INTRAVENOUS
  Filled 2015-09-18: qty 250

## 2015-09-18 MED ORDER — SODIUM CHLORIDE 0.9 % IV SOLN
Freq: Once | INTRAVENOUS | Status: AC
  Administered 2015-09-18: 10:00:00 via INTRAVENOUS
  Filled 2015-09-18: qty 1000

## 2015-09-18 MED ORDER — DIPHENHYDRAMINE HCL 25 MG PO CAPS
25.0000 mg | ORAL_CAPSULE | Freq: Once | ORAL | Status: AC
  Administered 2015-09-18: 25 mg via ORAL
  Filled 2015-09-18: qty 1

## 2015-09-18 NOTE — Progress Notes (Signed)
Spoke with Dr. Rogue Bussing. V/o to type/screen for 1 unit of blood.  Orders released. RN contacted Kieth Brightly in blood bank that pt will rcv 1 unit of blood.

## 2015-09-19 ENCOUNTER — Ambulatory Visit: Payer: Commercial Managed Care - HMO

## 2015-09-19 LAB — TYPE AND SCREEN
ABO/RH(D): B POS
Antibody Screen: NEGATIVE
UNIT DIVISION: 0

## 2015-09-24 ENCOUNTER — Other Ambulatory Visit: Payer: Self-pay | Admitting: *Deleted

## 2015-09-24 DIAGNOSIS — Z931 Gastrostomy status: Secondary | ICD-10-CM

## 2015-09-24 NOTE — Progress Notes (Signed)
Nutrition Assessment:   Reason for Assessment: new PEG tube placed  ASSESSMENT:  Problem/Condition: Pt with squamous cell carcinoma of left tongue/floor of mouth.   Currently undergoing chemo (cisplatin) and radiation. PEG tube placed by IR on 6/22 20 Fr gastrostomy tube  PMHx: HTN, HLD, hypothyroidism, lactose intolerant  Food/Nutrition History: Pt reports has no taste. Able to drink mostly water, almond milk.  Eating soups with vegetables, shredded meat (no red meat), has tried macroni and cheese recently. Drinks 1-2 boost per day (original).  Pt has not taken any tube feeding via tube since tube was placed.  Has been flushing tube regularly.  Medications:nystatin for thrush but better, colace BID, diflucan, miralax every other day, KCL, comapzine, carafate Pt reports currently not giving any medications via tube, taking orally.   Labs: Na 132, K 3.4, glucose 126, Hgb 6.2, Mg 1.7  Gastrointestional profile: Pt reports normal BM every other day. No nausea or trouble swallowing or chewing  Ht: 5 ft 1 inch  Wt: 87.9 pounds in office today    UBW:  118 pounds (Feb/March of 2017)  25% wt loss in the last 5 months    Estimated Energy Needs  Kcals: [id number] kcals/d  Protein: 47-59 g/d Fluid: >/=1331ml/d  NUTRITION DIAGNOSIS: Unintentional weight loss related to cancer and cancer related treatments as evidenced by 25% wt loss in the last 5 months and recent PEG tube placement.   GOAL: Pt to meet at least 90% of estimated nutritional needs.  MONITOR: Tube feeding tolerance, wt trends  INTERVENTION: -Discussed high protein, high calorie easy to swallow foods -Encouraged pt to continue to drink boost BID for added nutrition orally -Reviewed and provided pt with instructions on how to give bolus feeding and water flush via PEG -Recommend jevity 1.2 bolus 4 cans per day to meet nutritional needs.  Will provide 1137 kcals, 53 g of protein and 767 ml free water, fiber 17 g per  day.  Pt will plan to start with 1/2 can 4 times per day and continue to maximize oral nutrition.  Flush with 34ml warm water before and after each feeding.  Pt verbalized understanding and teachback method used. -Recommend checking phosphorus as pt at risk for refeeding syndrome    NEXT VISIT: Follow-up in 1 week  Kyree Adriano B. Zenia Resides, [employer], [place] (pager) Weekend/On-Call pager [phone])

## 2015-09-25 ENCOUNTER — Inpatient Hospital Stay: Payer: Commercial Managed Care - HMO

## 2015-09-25 ENCOUNTER — Ambulatory Visit: Payer: Commercial Managed Care - HMO

## 2015-09-25 ENCOUNTER — Inpatient Hospital Stay (HOSPITAL_BASED_OUTPATIENT_CLINIC_OR_DEPARTMENT_OTHER): Payer: Commercial Managed Care - HMO | Admitting: Internal Medicine

## 2015-09-25 ENCOUNTER — Inpatient Hospital Stay
Admission: RE | Admit: 2015-09-25 | Payer: Commercial Managed Care - HMO | Source: Ambulatory Visit | Admitting: Radiation Oncology

## 2015-09-25 ENCOUNTER — Ambulatory Visit: Payer: Commercial Managed Care - HMO | Admitting: Radiation Oncology

## 2015-09-25 VITALS — BP 134/77 | HR 58 | Temp 95.7°F | Resp 18 | Wt 87.1 lb

## 2015-09-25 DIAGNOSIS — Z931 Gastrostomy status: Secondary | ICD-10-CM

## 2015-09-25 DIAGNOSIS — R634 Abnormal weight loss: Secondary | ICD-10-CM | POA: Diagnosis not present

## 2015-09-25 DIAGNOSIS — C049 Malignant neoplasm of floor of mouth, unspecified: Secondary | ICD-10-CM

## 2015-09-25 DIAGNOSIS — F17210 Nicotine dependence, cigarettes, uncomplicated: Secondary | ICD-10-CM | POA: Diagnosis not present

## 2015-09-25 DIAGNOSIS — E785 Hyperlipidemia, unspecified: Secondary | ICD-10-CM

## 2015-09-25 DIAGNOSIS — E876 Hypokalemia: Secondary | ICD-10-CM

## 2015-09-25 DIAGNOSIS — I739 Peripheral vascular disease, unspecified: Secondary | ICD-10-CM

## 2015-09-25 DIAGNOSIS — Z79899 Other long term (current) drug therapy: Secondary | ICD-10-CM | POA: Diagnosis not present

## 2015-09-25 DIAGNOSIS — I10 Essential (primary) hypertension: Secondary | ICD-10-CM

## 2015-09-25 DIAGNOSIS — C799 Secondary malignant neoplasm of unspecified site: Secondary | ICD-10-CM

## 2015-09-25 DIAGNOSIS — IMO0002 Reserved for concepts with insufficient information to code with codable children: Secondary | ICD-10-CM

## 2015-09-25 DIAGNOSIS — E860 Dehydration: Secondary | ICD-10-CM

## 2015-09-25 DIAGNOSIS — Z5111 Encounter for antineoplastic chemotherapy: Secondary | ICD-10-CM | POA: Diagnosis not present

## 2015-09-25 DIAGNOSIS — C7989 Secondary malignant neoplasm of other specified sites: Secondary | ICD-10-CM | POA: Diagnosis not present

## 2015-09-25 DIAGNOSIS — D6481 Anemia due to antineoplastic chemotherapy: Secondary | ICD-10-CM

## 2015-09-25 LAB — CBC WITH DIFFERENTIAL/PLATELET
BASOS PCT: 1 %
Basophils Absolute: 0 10*3/uL (ref 0–0.1)
EOS ABS: 0 10*3/uL (ref 0–0.7)
Eosinophils Relative: 1 %
HCT: 22.5 % — ABNORMAL LOW (ref 35.0–47.0)
HEMOGLOBIN: 8 g/dL — AB (ref 12.0–16.0)
LYMPHS ABS: 0.5 10*3/uL — AB (ref 1.0–3.6)
LYMPHS PCT: 21 %
MCH: 34.5 pg — ABNORMAL HIGH (ref 26.0–34.0)
MCHC: 35.6 g/dL (ref 32.0–36.0)
MCV: 96.9 fL (ref 80.0–100.0)
MONO ABS: 0.5 10*3/uL (ref 0.2–0.9)
MONOS PCT: 20 %
NEUTROS ABS: 1.3 10*3/uL — AB (ref 1.4–6.5)
Neutrophils Relative %: 57 %
Platelets: 125 10*3/uL — ABNORMAL LOW (ref 150–440)
RBC: 2.32 MIL/uL — AB (ref 3.80–5.20)
RDW: 20.1 % — ABNORMAL HIGH (ref 11.5–14.5)
WBC: 2.3 10*3/uL — ABNORMAL LOW (ref 3.6–11.0)

## 2015-09-25 LAB — COMPREHENSIVE METABOLIC PANEL
ALBUMIN: 3.3 g/dL — AB (ref 3.5–5.0)
ALT: 11 U/L — ABNORMAL LOW (ref 14–54)
ANION GAP: 6 (ref 5–15)
AST: 18 U/L (ref 15–41)
Alkaline Phosphatase: 67 U/L (ref 38–126)
BUN: 14 mg/dL (ref 6–20)
CHLORIDE: 102 mmol/L (ref 101–111)
CO2: 27 mmol/L (ref 22–32)
Calcium: 8 mg/dL — ABNORMAL LOW (ref 8.9–10.3)
Creatinine, Ser: 0.66 mg/dL (ref 0.44–1.00)
GFR calc Af Amer: >60 mL/min (ref >60)
GFR calc non Af Amer: >60 mL/min (ref >60)
GLUCOSE: 136 mg/dL — AB (ref 65–99)
POTASSIUM: 3 mmol/L — AB (ref 3.5–5.1)
SODIUM: 135 mmol/L (ref 135–145)
TOTAL PROTEIN: 5.8 g/dL — AB (ref 6.5–8.1)
Total Bilirubin: 0.5 mg/dL (ref 0.3–1.2)

## 2015-09-25 LAB — MAGNESIUM: MAGNESIUM: 1.7 mg/dL (ref 1.7–2.4)

## 2015-09-25 LAB — PHOSPHORUS: PHOSPHORUS: 3.1 mg/dL (ref 2.5–4.6)

## 2015-09-25 MED ORDER — HEPARIN SOD (PORK) LOCK FLUSH 100 UNIT/ML IV SOLN
500.0000 [IU] | Freq: Once | INTRAVENOUS | Status: AC
  Administered 2015-09-25: 500 [IU] via INTRAVENOUS
  Filled 2015-09-25: qty 5

## 2015-09-25 MED ORDER — POTASSIUM CHLORIDE 20 MEQ PO PACK: 40.0000 meq | PACK | Freq: Two times a day (BID) | ORAL | Status: DC

## 2015-09-25 MED ORDER — SODIUM CHLORIDE 0.9% FLUSH
10.0000 mL | INTRAVENOUS | Status: DC | PRN
  Administered 2015-09-25: 10 mL via INTRAVENOUS
  Filled 2015-09-25: qty 10

## 2015-09-25 NOTE — Progress Notes (Signed)
Patient states she met with nutritionist yesterday.  Will begin tube feedings tomorrow.

## 2015-09-25 NOTE — Progress Notes (Signed)
Lower extremity edema + 1 in ankles.  Patient educated by Dr. Rogue Bussing and RN to take potassium 20 meq packet 2 packets to equal 40 meq twice daily x 1 week, then proceed to take 1 packet twice daily. Teach back process performed.

## 2015-09-25 NOTE — Assessment & Plan Note (Addendum)
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 Unresectable- Currently on concurrent chemoradiation status post 6 cycles of chemotherapy. No significant clinical improvement noted.  Talked to Dr. Donella Stade- currently on a break from radiation; plan start RT again next week;   # HOLD CHEMO today; plan starting next week with RT; most likely due to more treatments.  # Severe anemia hemoglobin 6.7-secondary to chemotherapy improved-hemoglobin 8 today. status post transfusion.   # Hypokalemia- recommend potassium . new prescription.  # For nutrition weight loss-  Status post PEG tube placement.  Will start tomorrow.  # Recommend follow-up in 2 weeks/labs/chemotherapy. Discussed with Dr. Donella Stade; and also speak to Dr. Tami Ribas.

## 2015-09-25 NOTE — Progress Notes (Signed)
[facility] NOTE  Patient Care Team: Tracie Harrier, MD as PCP - General (Internal Medicine) Noreene Filbert, MD as Referring Physician (Radiation Oncology) Algernon Huxley, MD as Referring Physician (Vascular Surgery)  CHIEF COMPLAINTS/PURPOSE OF CONSULTATION:   Oncology History   # April 2017-[facility] [STAGE IVA T4N2; Dr.McQueen; Left floor of mouth -Bx; Left neck LN-FNA+] left tongue/ floor of mouth- 4 x 3 x 2 cm.; 4.3 x 3.3 x 3 cm complex mass extends through the left mylohyoid muscle below the left mandible anterior to left submandibular gland] - START Cis [5/15]- weekly with RT [5/17]  # s/p PEG [September 06 2015]  # Smoker # weight loss     Cancer of floor of mouth ([place])   07/10/2015 Initial Diagnosis Cancer of floor of mouth ([place])     HISTORY OF PRESENTING ILLNESS:  Rebecca Mcmillan 70 y.o.  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  Patient in the interim had a PEG tube placed;Patient met with the nutritionist today; plan starting to quit smoking  In the interim patient also had PRBC transfusion. Patient has finished 6 weekly treatments of cisplatin along with radiation- so far.  No significant  Reduction of the   Left mandibular mass noted.   She denies any significant pain.     ROS: A complete 10 point review of system is done which is negative except mentioned above in history of present illness  MEDICAL HISTORY:  Past Medical History  Diagnosis Date  . Hypertension   . Thyroid disease   . Hypothyroidism   . Hyperlipidemia   . Peripheral vascular disease ([place])   . Cancer ([place])     tongue    SURGICAL HISTORY: None  SOCIAL HISTORY: Patient previously used to work in nursing homes. Denies any alcohol. Smokes a pack of Celexa day for 45 years lives with her husband in [place]. No children.  Social History   Social History   . Marital Status: Married    Spouse Name: N/A  . Number of Children: N/A  . Years of Education: N/A   Occupational History  . Not on file.   Social History Main Topics  . Smoking status: Former Smoker -- 1.00 packs/day for 45 years    Types: Cigarettes    Quit date: 06/19/2015  . Smokeless tobacco: Never Used  . Alcohol Use: No  . Drug Use: No  . Sexual Activity: Not on file   Other Topics Concern  . Not on file   Social History Narrative    FAMILY HISTORY:no family history of head and neck cancer.   ALLERGIES:  is allergic to garlic; lactose intolerance (gi); and tetracyclines & related.  MEDICATIONS:  Current Outpatient Prescriptions  Medication Sig Dispense Refill  . atenolol (TENORMIN) 25 MG tablet Take 25 mg by mouth daily.     Marland Kitchen docusate sodium (COLACE) 100 MG capsule Take 100 mg by mouth 2 (two) times daily.    . fluconazole (DIFLUCAN) 100 MG tablet Take 1 tablet (100 mg total) by mouth daily. 7 tablet 0  . Gauze Pads & Dressings (DERMACEA DRAIN SPONGES) 4"X4" PADS 1 each by Does not apply route 1 day or 1 dose. 50 each 0  . Irrigation Supplies (PISTON IRRIGATION SYRINGE) MISC 1 Syringe by Does not apply route 1 day or 1 dose. 30 each 0  . levothyroxine (SYNTHROID, LEVOTHROID) 50 MCG  tablet     . lidocaine-prilocaine (EMLA) cream Apply 1 application topically as needed. 30 g 6  . losartan (COZAAR) 100 MG tablet     . lovastatin (MEVACOR) 40 MG tablet Take 40 mg by mouth every evening.     . Nutritional Supplements (FEEDING SUPPLEMENT, JEVITY 1.2 CAL,) LIQD Place 237 mLs into feeding tube continuous. May use Jevity supplemental feeding 4xs a day through j-peg tube 1422 mL 12  . nystatin (MYCOSTATIN) 100000 UNIT/ML suspension Take 5 mLs (500,000 Units total) by mouth 4 (four) times daily. 60 mL 2  . ondansetron (ZOFRAN) 8 MG tablet TAKE 1 TABLET EVERY 8 HOURS AS NEEDED FOR NAUSEA AND VOMITING (START 3 DAYS AFTER CHEMOTHERAPY) 40 tablet 0  . oxyCODONE-acetaminophen  (ROXICET) 5-325 MG tablet Take 1-2 tablets by mouth every 4 (four) hours as needed for severe pain. 40 tablet 0  . polyethylene glycol (MIRALAX / GLYCOLAX) packet Take 17 g by mouth daily.    . potassium chloride (KLOR-CON) 20 MEQ packet Take 40 mEq by mouth 2 (two) times daily. X 1 week. Then 20 mcq twice daily 70 packet 3  . prochlorperazine (COMPAZINE) 10 MG tablet Take 1 tablet (10 mg total) by mouth every 6 (six) hours as needed for nausea or vomiting. 30 tablet 0  . sucralfate (CARAFATE) 1 g tablet Take 1 tablet (1 g total) by mouth 4 (four) times daily -  with meals and at bedtime. 90 tablet 3  . Water For Irrigation, Sterile (STERILE WATER FOR IRRIGATION) Irrigate with 1,000 mLs as directed once. 1000 mL 0   No current facility-administered medications for this visit.      Marland Kitchen  PHYSICAL EXAMINATION: ECOG PERFORMANCE STATUS: 1 - Symptomatic but completely ambulatory  Filed Vitals:   09/25/15 0839  BP: 134/77  Pulse: 58  Temp: 95.7 F (35.4 C)  Resp: 18   Filed Weights   09/25/15 0839  Weight: 87 lb 1.3 oz (39.5 kg)    GENERAL: Thin built moderately nourished Caucasian female patient anxious. Alert, no distress and comfortable. She is Accompanied by her husband.  EYES: no pallor or icterus OROPHARYNX: no thrush or ulceration; poor dentition. No obvious masses noted in the oral cavity. NECK: supple, 5-6cm mass[soft/cystic]- submandibular mass. Non-tender.  LYMPH:  no palpable lymphadenopathy xillary or inguinal regions LUNGS: Decreased breath sounds bilaterally No wheeze or crackles; barrel chested.  HEART/CVS: regular rate & rhythm and no murmurs; No lower extremity edema ABDOMEN: abdomen soft, non-tender and normal bowel sounds Musculoskeletal:no cyanosis of digits and no clubbing  PSYCH: alert & oriented x 3 with fluent speech NEURO: no focal motor/sensory deficits SKIN:  no rashes or significant lesions  LABORATORY DATA:  I have reviewed the data as listed Lab  Results  Component Value Date   WBC 2.3* 09/25/2015   HGB 8.0* 09/25/2015   HCT 22.5* 09/25/2015   MCV 96.9 09/25/2015   PLT 125* 09/25/2015    Recent Labs  09/11/15 0811 09/18/15 0913 09/25/15 0810  NA 131* 132* 135  K 3.0* 3.4* 3.0*  CL 101 103 102  CO2 '24 25 27  ' GLUCOSE 133* 126* 136*  BUN '13 16 14  ' CREATININE 0.66 0.70 0.66  CALCIUM 8.0* 8.0* 8.0*  GFRNONAA >60 >60 >60  GFRAA >60 >60 >60  PROT 5.9* 5.8* 5.8*  ALBUMIN 3.3* 3.2* 3.3*  AST '18 18 18  ' ALT 15 11* 11*  ALKPHOS 69 64 67  BILITOT 0.6 0.4 0.5    ASSESSMENT & PLAN:  Cancer of floor of mouth ([place])  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 Unresectable- Currently on concurrent chemoradiation status post 6 cycles of chemotherapy. No significant clinical improvement noted.  Talked to Dr. Donella Stade- currently on a break from radiation; plan start RT again next week;   # HOLD CHEMO today; plan starting next week with RT; most likely due to more treatments.  # Severe anemia hemoglobin 6.7-secondary to chemotherapy improved-hemoglobin 8 today. status post transfusion.   # Hypokalemia- recommend potassium . new prescription.  # For nutrition weight loss-  Status post PEG tube placement.  Will start tomorrow.  # Recommend follow-up in 2 weeks/labs/chemotherapy. Discussed with Dr. Donella Stade; and also speak to Dr. Tami Ribas.    # patient will get weekly CBC BMP and magnesium/cisplatin; follow-up with me in 2 weeks.    Cammie Sickle, MD 09/26/2015 7:38 AM

## 2015-09-26 ENCOUNTER — Telehealth: Payer: Self-pay

## 2015-09-26 DIAGNOSIS — C01 Malignant neoplasm of base of tongue: Secondary | ICD-10-CM | POA: Diagnosis not present

## 2015-09-26 DIAGNOSIS — Z0000 Encounter for general adult medical examination without abnormal findings: Secondary | ICD-10-CM | POA: Diagnosis not present

## 2015-09-26 DIAGNOSIS — E7800 Pure hypercholesterolemia, unspecified: Secondary | ICD-10-CM | POA: Diagnosis not present

## 2015-09-26 DIAGNOSIS — E039 Hypothyroidism, unspecified: Secondary | ICD-10-CM | POA: Diagnosis not present

## 2015-09-26 DIAGNOSIS — D6481 Anemia due to antineoplastic chemotherapy: Secondary | ICD-10-CM | POA: Diagnosis not present

## 2015-09-26 DIAGNOSIS — I10 Essential (primary) hypertension: Secondary | ICD-10-CM | POA: Diagnosis not present

## 2015-09-26 DIAGNOSIS — T451X5A Adverse effect of antineoplastic and immunosuppressive drugs, initial encounter: Secondary | ICD-10-CM | POA: Diagnosis not present

## 2015-09-27 ENCOUNTER — Ambulatory Visit
Admission: RE | Admit: 2015-09-27 | Discharge: 2015-09-27 | Disposition: A | Discharge status: Home / Self Care | Payer: Commercial Managed Care - HMO | Source: Ambulatory Visit | Attending: Radiation Oncology | Admitting: Radiation Oncology

## 2015-09-27 DIAGNOSIS — C01 Malignant neoplasm of base of tongue: Secondary | ICD-10-CM | POA: Diagnosis not present

## 2015-09-27 DIAGNOSIS — Z510 Encounter for antineoplastic radiation therapy: Secondary | ICD-10-CM | POA: Diagnosis not present

## 2015-09-27 DIAGNOSIS — F17210 Nicotine dependence, cigarettes, uncomplicated: Secondary | ICD-10-CM | POA: Diagnosis not present

## 2015-09-30 ENCOUNTER — Encounter: Payer: Self-pay | Admitting: *Deleted

## 2015-09-30 NOTE — Progress Notes (Signed)
[place] auth request form for [employer] approval of Potassium Chloride 20 meq packets complete and faxed to 1 [phone]

## 2015-10-01 ENCOUNTER — Other Ambulatory Visit: Payer: Self-pay | Admitting: Internal Medicine

## 2015-10-02 ENCOUNTER — Inpatient Hospital Stay: Payer: Commercial Managed Care - HMO

## 2015-10-02 VITALS — BP 117/62 | HR 57 | Temp 96.2°F

## 2015-10-02 DIAGNOSIS — Z79899 Other long term (current) drug therapy: Secondary | ICD-10-CM | POA: Diagnosis not present

## 2015-10-02 DIAGNOSIS — R634 Abnormal weight loss: Secondary | ICD-10-CM | POA: Diagnosis not present

## 2015-10-02 DIAGNOSIS — Z5111 Encounter for antineoplastic chemotherapy: Secondary | ICD-10-CM | POA: Diagnosis not present

## 2015-10-02 DIAGNOSIS — C049 Malignant neoplasm of floor of mouth, unspecified: Secondary | ICD-10-CM

## 2015-10-02 DIAGNOSIS — F17210 Nicotine dependence, cigarettes, uncomplicated: Secondary | ICD-10-CM | POA: Diagnosis not present

## 2015-10-02 DIAGNOSIS — D6481 Anemia due to antineoplastic chemotherapy: Secondary | ICD-10-CM | POA: Diagnosis not present

## 2015-10-02 DIAGNOSIS — E876 Hypokalemia: Secondary | ICD-10-CM | POA: Diagnosis not present

## 2015-10-02 DIAGNOSIS — C7989 Secondary malignant neoplasm of other specified sites: Secondary | ICD-10-CM | POA: Diagnosis not present

## 2015-10-02 DIAGNOSIS — I10 Essential (primary) hypertension: Secondary | ICD-10-CM | POA: Diagnosis not present

## 2015-10-02 LAB — COMPREHENSIVE METABOLIC PANEL
ALT: 12 U/L — AB (ref 14–54)
AST: 15 U/L (ref 15–41)
Albumin: 3.6 g/dL (ref 3.5–5.0)
Alkaline Phosphatase: 69 U/L (ref 38–126)
Anion gap: 7 (ref 5–15)
BUN: 17 mg/dL (ref 6–20)
CHLORIDE: 100 mmol/L — AB (ref 101–111)
CO2: 26 mmol/L (ref 22–32)
Calcium: 8.6 mg/dL — ABNORMAL LOW (ref 8.9–10.3)
Creatinine, Ser: 0.62 mg/dL (ref 0.44–1.00)
Glucose, Bld: 115 mg/dL — ABNORMAL HIGH (ref 65–99)
POTASSIUM: 4 mmol/L (ref 3.5–5.1)
SODIUM: 133 mmol/L — AB (ref 135–145)
Total Bilirubin: 0.4 mg/dL (ref 0.3–1.2)
Total Protein: 6.5 g/dL (ref 6.5–8.1)

## 2015-10-02 LAB — CBC WITH DIFFERENTIAL/PLATELET
BASOS ABS: 0 10*3/uL (ref 0–0.1)
Basophils Relative: 1 %
EOS ABS: 0 10*3/uL (ref 0–0.7)
EOS PCT: 1 %
HCT: 24.6 % — ABNORMAL LOW (ref 35.0–47.0)
Hemoglobin: 8.7 g/dL — ABNORMAL LOW (ref 12.0–16.0)
LYMPHS PCT: 14 %
Lymphs Abs: 0.7 10*3/uL — ABNORMAL LOW (ref 1.0–3.6)
MCH: 35.4 pg — AB (ref 26.0–34.0)
MCHC: 35.3 g/dL (ref 32.0–36.0)
MCV: 100.1 fL — AB (ref 80.0–100.0)
MONO ABS: 0.6 10*3/uL (ref 0.2–0.9)
Monocytes Relative: 12 %
Neutro Abs: 3.5 10*3/uL (ref 1.4–6.5)
Neutrophils Relative %: 72 %
PLATELETS: 197 10*3/uL (ref 150–440)
RBC: 2.46 MIL/uL — AB (ref 3.80–5.20)
RDW: 26 % — AB (ref 11.5–14.5)
WBC: 4.7 10*3/uL (ref 3.6–11.0)

## 2015-10-02 LAB — MAGNESIUM: MAGNESIUM: 1.9 mg/dL (ref 1.7–2.4)

## 2015-10-02 LAB — SAMPLE TO BLOOD BANK

## 2015-10-02 MED ORDER — CISPLATIN CHEMO INJECTION 100MG/100ML
40.0000 mg/m2 | Freq: Once | INTRAVENOUS | Status: AC
  Administered 2015-10-02: 55 mg via INTRAVENOUS
  Filled 2015-10-02: qty 55

## 2015-10-02 MED ORDER — POTASSIUM CHLORIDE 2 MEQ/ML IV SOLN
Freq: Once | INTRAVENOUS | Status: AC
  Administered 2015-10-02: 10:00:00 via INTRAVENOUS
  Filled 2015-10-02: qty 1000

## 2015-10-02 MED ORDER — SODIUM CHLORIDE 0.9% FLUSH
10.0000 mL | INTRAVENOUS | Status: DC | PRN
Start: 2015-10-02 — End: 2015-10-02
  Administered 2015-10-02: 10 mL via INTRAVENOUS
  Filled 2015-10-02: qty 10

## 2015-10-02 MED ORDER — HEPARIN SOD (PORK) LOCK FLUSH 100 UNIT/ML IV SOLN
500.0000 [IU] | Freq: Once | INTRAVENOUS | Status: AC | PRN
  Administered 2015-10-02: 500 [IU]
  Filled 2015-10-02: qty 5

## 2015-10-02 MED ORDER — SODIUM CHLORIDE 0.9 % IV SOLN
Freq: Once | INTRAVENOUS | Status: AC
  Administered 2015-10-02: 10:00:00 via INTRAVENOUS
  Filled 2015-10-02: qty 1000

## 2015-10-02 MED ORDER — SODIUM CHLORIDE 0.9 % IV SOLN
Freq: Once | INTRAVENOUS | Status: AC
  Administered 2015-10-02: 12:00:00 via INTRAVENOUS
  Filled 2015-10-02: qty 5

## 2015-10-02 MED ORDER — PALONOSETRON HCL INJECTION 0.25 MG/5ML
0.2500 mg | Freq: Once | INTRAVENOUS | Status: AC
  Administered 2015-10-02: 0.25 mg via INTRAVENOUS
  Filled 2015-10-02: qty 5

## 2015-10-03 ENCOUNTER — Telehealth: Payer: Self-pay

## 2015-10-03 NOTE — Telephone Encounter (Signed)
Nutrition Follow-up:  Pt unable to keep outpatient nutrition follow-up appointment today at 2:30pm.  Available via phone to discuss PEG tube feeding. Pt reports had chemo yesterday and planning to start radiation.  Food/Nutrition History: Pt reports tolerating 2 1/2 to 3 cans of jevity 1.2 (provides 712-855 kcals/d, 33-39.9 g of protein).  Pt getting the hang of PEG tube feeding.  Reports that she is drinking boost 3 cans per day (750 kcals and 30 g of protein) and eating increased protein (ie more shredded chicken, eggs, soft deli meats such as ham). Pt reports that she is also drinking liquids and flush tube with water before and after feeding to keep tube clean.  Reports Dr. B looked at tube yesterday and all was well.    Medications: continues with colace BID, miralax every other day  Labs: 7/19 Na 133, glucose 115, hgb 8.7, phosphorus 3.1, Mag 1.9  Gastrointestional profile: pt reports still having BMs q other day and continues with stool softners.    Ht: 5 ft 1 inch  Wt: pt reports thinks last wt was 88 pounds but unsure when that was.  Noted 87.9 pounds on visit on 7/11  Estimated Energy Needs  Kcals: [id number] kcals/d Protein: 47-59 g/d Fluid: >/=1322ml/d  NUTRITION DIAGNOSIS: Unintentional wt loss related to cancer and cancer related treatments as evidenced by 25% wt loss in the last 5 months and recent PEG tube placement.   GOAL: Pt to meet at least 90% of estimated nutritional needs.  MONITOR: tube feeding tolerance, wt trends  INTERVENTION: -Encouraged continued intake of boost 3 times per day for additional kcals orally and continue with high protein, high calorie foods.  Encouraged small frequent meals between PEG tube feeding to decrease full feeling. -Encouraged to continue PEG tube feeding of 2 1/2 to 3 cans of Jevity 1.2 per day (can take up to 1 full can at a time if would like). May need to increase to 4 full cans per day if intake decreases and/or wt continues to  decline. Continue water flushes via PEG to keep PEG clean and for hydration.   NEXT VISIT: 8/3 at 1:15pm  Iya Hamed B. Zenia Resides, [place], [place] (pager) Weekend/On-Call pager [phone])

## 2015-10-07 DIAGNOSIS — C049 Malignant neoplasm of floor of mouth, unspecified: Secondary | ICD-10-CM | POA: Diagnosis not present

## 2015-10-07 DIAGNOSIS — F17210 Nicotine dependence, cigarettes, uncomplicated: Secondary | ICD-10-CM | POA: Diagnosis not present

## 2015-10-07 DIAGNOSIS — Z510 Encounter for antineoplastic radiation therapy: Secondary | ICD-10-CM | POA: Diagnosis not present

## 2015-10-07 DIAGNOSIS — C01 Malignant neoplasm of base of tongue: Secondary | ICD-10-CM | POA: Diagnosis not present

## 2015-10-08 ENCOUNTER — Other Ambulatory Visit: Payer: Self-pay | Admitting: *Deleted

## 2015-10-08 MED ORDER — FLUCONAZOLE 100 MG PO TABS: 100.0000 mg | ORAL_TABLET | Freq: Every day | ORAL | 0 refills | Status: DC

## 2015-10-09 ENCOUNTER — Inpatient Hospital Stay: Payer: Commercial Managed Care - HMO

## 2015-10-09 ENCOUNTER — Ambulatory Visit
Admission: RE | Admit: 2015-10-09 | Discharge: 2015-10-09 | Disposition: A | Discharge status: Home / Self Care | Payer: Commercial Managed Care - HMO | Source: Ambulatory Visit | Attending: Radiation Oncology | Admitting: Radiation Oncology

## 2015-10-09 ENCOUNTER — Inpatient Hospital Stay (HOSPITAL_BASED_OUTPATIENT_CLINIC_OR_DEPARTMENT_OTHER): Payer: Commercial Managed Care - HMO | Admitting: Internal Medicine

## 2015-10-09 VITALS — BP 106/56 | HR 56 | Temp 95.3°F | Resp 18

## 2015-10-09 VITALS — BP 167/80 | HR 53 | Temp 95.1°F | Resp 18 | Wt 82.2 lb

## 2015-10-09 DIAGNOSIS — C7989 Secondary malignant neoplasm of other specified sites: Secondary | ICD-10-CM | POA: Diagnosis not present

## 2015-10-09 DIAGNOSIS — D6481 Anemia due to antineoplastic chemotherapy: Secondary | ICD-10-CM | POA: Diagnosis not present

## 2015-10-09 DIAGNOSIS — Z79899 Other long term (current) drug therapy: Secondary | ICD-10-CM

## 2015-10-09 DIAGNOSIS — Z5111 Encounter for antineoplastic chemotherapy: Secondary | ICD-10-CM | POA: Diagnosis not present

## 2015-10-09 DIAGNOSIS — R634 Abnormal weight loss: Secondary | ICD-10-CM

## 2015-10-09 DIAGNOSIS — IMO0002 Reserved for concepts with insufficient information to code with codable children: Secondary | ICD-10-CM

## 2015-10-09 DIAGNOSIS — C049 Malignant neoplasm of floor of mouth, unspecified: Secondary | ICD-10-CM | POA: Diagnosis not present

## 2015-10-09 DIAGNOSIS — F17210 Nicotine dependence, cigarettes, uncomplicated: Secondary | ICD-10-CM

## 2015-10-09 DIAGNOSIS — C799 Secondary malignant neoplasm of unspecified site: Secondary | ICD-10-CM

## 2015-10-09 DIAGNOSIS — E876 Hypokalemia: Secondary | ICD-10-CM | POA: Diagnosis not present

## 2015-10-09 DIAGNOSIS — I10 Essential (primary) hypertension: Secondary | ICD-10-CM | POA: Diagnosis not present

## 2015-10-09 LAB — CBC WITH DIFFERENTIAL/PLATELET
Basophils Absolute: 0 10*3/uL (ref 0–0.1)
Basophils Relative: 1 %
EOS ABS: 0 10*3/uL (ref 0–0.7)
Eosinophils Relative: 1 %
HCT: 24.3 % — ABNORMAL LOW (ref 35.0–47.0)
HEMOGLOBIN: 8.6 g/dL — AB (ref 12.0–16.0)
LYMPHS ABS: 0.8 10*3/uL — AB (ref 1.0–3.6)
LYMPHS PCT: 13 %
MCH: 36.3 pg — AB (ref 26.0–34.0)
MCHC: 35.5 g/dL (ref 32.0–36.0)
MCV: 102.5 fL — ABNORMAL HIGH (ref 80.0–100.0)
Monocytes Absolute: 0.7 10*3/uL (ref 0.2–0.9)
Monocytes Relative: 11 %
NEUTROS PCT: 74 %
Neutro Abs: 4.9 10*3/uL (ref 1.4–6.5)
Platelets: 195 10*3/uL (ref 150–440)
RBC: 2.37 MIL/uL — AB (ref 3.80–5.20)
RDW: 25 % — ABNORMAL HIGH (ref 11.5–14.5)
WBC: 6.5 10*3/uL (ref 3.6–11.0)

## 2015-10-09 LAB — COMPREHENSIVE METABOLIC PANEL
ALK PHOS: 71 U/L (ref 38–126)
ALT: 14 U/L (ref 14–54)
AST: 18 U/L (ref 15–41)
Albumin: 3.6 g/dL (ref 3.5–5.0)
Anion gap: 5 (ref 5–15)
BUN: 13 mg/dL (ref 6–20)
CALCIUM: 8.2 mg/dL — AB (ref 8.9–10.3)
CO2: 23 mmol/L (ref 22–32)
CREATININE: 0.59 mg/dL (ref 0.44–1.00)
Chloride: 103 mmol/L (ref 101–111)
GFR calc non Af Amer: >60 mL/min (ref >60)
GLUCOSE: 113 mg/dL — AB (ref 65–99)
Potassium: 4.1 mmol/L (ref 3.5–5.1)
SODIUM: 131 mmol/L — AB (ref 135–145)
Total Bilirubin: 0.5 mg/dL (ref 0.3–1.2)
Total Protein: 6.4 g/dL — ABNORMAL LOW (ref 6.5–8.1)

## 2015-10-09 LAB — MAGNESIUM: MAGNESIUM: 1.8 mg/dL (ref 1.7–2.4)

## 2015-10-09 LAB — SAMPLE TO BLOOD BANK

## 2015-10-09 MED ORDER — PALONOSETRON HCL INJECTION 0.25 MG/5ML
0.2500 mg | Freq: Once | INTRAVENOUS | Status: AC
  Administered 2015-10-09: 0.25 mg via INTRAVENOUS
  Filled 2015-10-09: qty 5

## 2015-10-09 MED ORDER — HEPARIN SOD (PORK) LOCK FLUSH 100 UNIT/ML IV SOLN: 500.0000 [IU] | Freq: Once | INTRAVENOUS | Status: DC | PRN

## 2015-10-09 MED ORDER — SODIUM CHLORIDE 0.9% FLUSH
10.0000 mL | INTRAVENOUS | Status: DC | PRN
  Administered 2015-10-09: 10 mL via INTRAVENOUS
  Filled 2015-10-09: qty 10

## 2015-10-09 MED ORDER — SODIUM CHLORIDE 0.9 % IV SOLN
Freq: Once | INTRAVENOUS | Status: AC
  Administered 2015-10-09: 12:00:00 via INTRAVENOUS
  Filled 2015-10-09: qty 5

## 2015-10-09 MED ORDER — HEPARIN SOD (PORK) LOCK FLUSH 100 UNIT/ML IV SOLN
500.0000 [IU] | Freq: Once | INTRAVENOUS | Status: AC
  Administered 2015-10-09: 500 [IU] via INTRAVENOUS
  Filled 2015-10-09: qty 5

## 2015-10-09 MED ORDER — SODIUM CHLORIDE 0.9% FLUSH
10.0000 mL | INTRAVENOUS | Status: DC | PRN
  Filled 2015-10-09: qty 10

## 2015-10-09 MED ORDER — SODIUM CHLORIDE 0.9 % IV SOLN
Freq: Once | INTRAVENOUS | Status: AC
  Administered 2015-10-09: 10:00:00 via INTRAVENOUS
  Filled 2015-10-09: qty 1000

## 2015-10-09 MED ORDER — SODIUM CHLORIDE 0.9 % IV SOLN
40.0000 mg/m2 | Freq: Once | INTRAVENOUS | Status: AC
  Administered 2015-10-09: 55 mg via INTRAVENOUS
  Filled 2015-10-09: qty 55

## 2015-10-09 MED ORDER — POTASSIUM CHLORIDE 2 MEQ/ML IV SOLN
Freq: Once | INTRAVENOUS | Status: AC
  Administered 2015-10-09: 10:00:00 via INTRAVENOUS
  Filled 2015-10-09: qty 1000

## 2015-10-09 NOTE — Assessment & Plan Note (Addendum)
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 Unresectable- Currently on concurrent chemoradiation status post 6 cycles of chemotherapy. No significant clinical improvement noted.    # Awaiting to start RT tomorrow [until aug 11th]; will start cis-weekly today [plan 2 more] ; proceed with chemo today; and again in 1 week [likely last treatment]  # Hypokalemia- improved.   # For nutrition weight loss-  Status post PEG tube placement. On tube feeds.  # Recommend follow-up in 2 weeks/labs Left message for Dr.Mcqueen.

## 2015-10-09 NOTE — Progress Notes (Signed)
[facility] NOTE  Patient Care Team: Tracie Harrier, MD as PCP - General (Internal Medicine) Noreene Filbert, MD as Referring Physician (Radiation Oncology) Algernon Huxley, MD as Referring Physician (Vascular Surgery)  CHIEF COMPLAINTS/PURPOSE OF CONSULTATION:   Oncology History   # April 2017-SCC [STAGE IVA T4N2; Dr.McQueen; Left floor of mouth -Bx; Left neck LN-FNA+] left tongue/ floor of mouth- 4 x 3 x 2 cm.; 4.3 x 3.3 x 3 cm complex mass extends through the left mylohyoid muscle below the left mandible anterior to left submandibular gland] - START Cis [5/15]- weekly with RT [5/17]  # s/p PEG [September 06 2015]  # Smoker # weight loss     Cancer of floor of mouth ([place])   07/10/2015 Initial Diagnosis    Cancer of floor of mouth ([place])       HISTORY OF PRESENTING ILLNESS:  Rebecca Mcmillan 70 y.o.  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  Patient in the interim had a PEG tube placed;She started tube feeds. She is trying to 3 cans a day.   Patient has finished 7 weekly treatments of cisplatin along with radiation- so far. Patient has had a radiation break for about 10 days; plan starting radiation again starting tomorrow. No significant  Reduction of the   Left mandibular mass noted.   She denies any significant pain.   She has lost 2 pounds. Also complains of fatigue.  ROS: A complete 10 point review of system is done which is negative except mentioned above in history of present illness  MEDICAL HISTORY:  Past Medical History:  Diagnosis Date  . Dysphagia   . G tube feedings ([place])   . Hyperlipidemia   . Hypertension   . Hypokalemia   . Hypothyroidism   . Malnourished ([place])   . Metastatic cancer ([place])   . 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 Peripheral vascular disease ([place])   . Thyroid disease     SURGICAL  HISTORY: None  SOCIAL HISTORY: Patient previously used to work in nursing homes. Denies any alcohol. Smokes a pack of Celexa day for 45 years lives with her husband in [place]. No children.  Social History   Social History  . Marital status: Married    Spouse name: N/A  . Number of children: N/A  . Years of education: N/A   Occupational History  . Not on file.   Social History Main Topics  . Smoking status: Former Smoker    Packs/day: 1.00    Years: 45.00    Types: Cigarettes    Quit date: 06/19/2015  . Smokeless tobacco: Never Used  . Alcohol use No  . Drug use: No  . Sexual activity: Not on file   Other Topics Concern  . Not on file   Social History Narrative  . No narrative on file    FAMILY HISTORY:no family history of head and neck cancer.   ALLERGIES:  is allergic to garlic; lactose intolerance (gi); and tetracyclines & related.  MEDICATIONS:  Current Outpatient Prescriptions  Medication Sig Dispense Refill  . atenolol (TENORMIN) 25 MG tablet Take 25 mg by mouth daily.     Marland Kitchen docusate sodium (COLACE) 100 MG capsule Take 100 mg by mouth 2 (two) times daily.    . fluconazole (DIFLUCAN) 100 MG tablet Take 1 tablet (100 mg total) by mouth daily. 7  tablet 0  . fluconazole (DIFLUCAN) 100 MG tablet Take 1 tablet (100 mg total) by mouth daily. 7 tablet 0  . Gauze Pads & Dressings (DERMACEA DRAIN SPONGES) 4"X4" PADS 1 each by Does not apply route 1 day or 1 dose. 50 each 0  . Irrigation Supplies (PISTON IRRIGATION SYRINGE) MISC 1 Syringe by Does not apply route 1 day or 1 dose. 30 each 0  . levothyroxine (SYNTHROID, LEVOTHROID) 50 MCG tablet     . lidocaine-prilocaine (EMLA) cream Apply 1 application topically as needed. 30 g 6  . losartan (COZAAR) 100 MG tablet     . lovastatin (MEVACOR) 40 MG tablet Take 40 mg by mouth every evening.     . Nutritional Supplements (FEEDING SUPPLEMENT, JEVITY 1.2 CAL,) LIQD Place 237 mLs into feeding tube continuous. May use Jevity  supplemental feeding 4xs a day through j-peg tube 1422 mL 12  . nystatin (MYCOSTATIN) 100000 UNIT/ML suspension Take 5 mLs (500,000 Units total) by mouth 4 (four) times daily. 60 mL 2  . ondansetron (ZOFRAN) 8 MG tablet TAKE 1 TABLET EVERY 8 HOURS AS NEEDED FOR NAUSEA AND VOMITING (START 3 DAYS AFTER CHEMOTHERAPY) 40 tablet 0  . oxyCODONE-acetaminophen (ROXICET) 5-325 MG tablet Take 1-2 tablets by mouth every 4 (four) hours as needed for severe pain. 40 tablet 0  . polyethylene glycol (MIRALAX / GLYCOLAX) packet Take 17 g by mouth daily.    . potassium chloride (KLOR-CON) 20 MEQ packet Take 40 mEq by mouth 2 (two) times daily. X 1 week. Then 20 mcq twice daily 70 packet 3  . prochlorperazine (COMPAZINE) 10 MG tablet Take 1 tablet (10 mg total) by mouth every 6 (six) hours as needed for nausea or vomiting. 30 tablet 0  . sucralfate (CARAFATE) 1 g tablet Take 1 tablet (1 g total) by mouth 4 (four) times daily -  with meals and at bedtime. 90 tablet 3  . Water For Irrigation, Sterile (STERILE WATER FOR IRRIGATION) Irrigate with 1,000 mLs as directed once. 1000 mL 0   No current facility-administered medications for this visit.    Facility-Administered Medications Ordered in Other Visits  Medication Dose Route Frequency Provider Last Rate Last Dose  . heparin lock flush 100 unit/mL  500 Units Intracatheter Once PRN Cammie Sickle, MD      . sodium chloride flush (NS) 0.9 % injection 10 mL  10 mL Intravenous PRN Cammie Sickle, MD   10 mL at 10/09/15 0826  . sodium chloride flush (NS) 0.9 % injection 10 mL  10 mL Intracatheter PRN Cammie Sickle, MD          .  PHYSICAL EXAMINATION: ECOG PERFORMANCE STATUS: 1 - Symptomatic but completely ambulatory  Vitals:   10/09/15 0853  BP: (!) 167/80  Pulse: (!) 53  Resp: 18  Temp: (!) 95.1 F (35.1 C)   Filed Weights   10/09/15 0853  Weight: 82 lb 4 oz (37.3 kg)    GENERAL: Thin built moderately nourished Caucasian female  patient anxious. Alert, no distress and comfortable. She is Accompanied by her husband.  EYES: no pallor or icterus OROPHARYNX: no thrush or ulceration; poor dentition. No obvious masses noted in the oral cavity.Thick secretions noted NECK: supple, 5-6cm mass[soft/cystic]- submandibular mass. Non-tender.  LYMPH:  no palpable lymphadenopathy xillary or inguinal regions LUNGS: Decreased breath sounds bilaterally No wheeze or crackles; barrel chested.  HEART/CVS: regular rate & rhythm and no murmurs; No lower extremity edema ABDOMEN: abdomen soft, non-tender and normal  bowel sounds Musculoskeletal:no cyanosis of digits and no clubbing  PSYCH: alert & oriented x 3 with fluent speech NEURO: no focal motor/sensory deficits SKIN:  no rashes or significant lesions  LABORATORY DATA:  I have reviewed the data as listed Lab Results  Component Value Date   WBC 6.5 10/09/2015   HGB 8.6 (L) 10/09/2015   HCT 24.3 (L) 10/09/2015   MCV 102.5 (H) 10/09/2015   PLT 195 10/09/2015    Recent Labs  09/25/15 0810 10/02/15 0833 10/09/15 0826  NA 135 133* 131*  K 3.0* 4.0 4.1  CL 102 100* 103  CO2 27 26 23   GLUCOSE 136* 115* 113*  BUN 14 17 13   CREATININE 0.66 0.62 0.59  CALCIUM 8.0* 8.6* 8.2*  GFRNONAA >60 >60 >60  GFRAA >60 >60 >60  PROT 5.8* 6.5 6.4*  ALBUMIN 3.3* 3.6 3.6  AST 18 15 18   ALT 11* 12* 14  ALKPHOS 67 69 71  BILITOT 0.5 0.4 0.5    ASSESSMENT & PLAN:  Cancer of floor of mouth ([facility])  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 Unresectable- Currently on concurrent chemoradiation status post 6 cycles of chemotherapy. No significant clinical improvement noted.    # Awaiting to start RT tomorrow [until aug 11th]; will start cis-weekly today [plan 2 more] ; proceed with chemo today; and again in 1 week [likely last treatment]  # Hypokalemia- improved.   # For nutrition weight loss-  Status post PEG tube placement. On  tube feeds.  # Recommend follow-up in 2 weeks/labs Left message for Dr.Mcqueen.   # patient will get weekly CBC BMP and magnesium/cisplatin; follow-up with me in 2 weeks.    Cammie Sickle, MD   10/09/2015 5:34 PM   Addendum: Discussed with Dr.McQueen- if the patient has poor response to therapy; if the patient agrees- she will need a major surgery likely mandibulectomy-at a tertiary center. For now awaiting repeat imaging after finishing therapy.

## 2015-10-09 NOTE — Progress Notes (Signed)
Patient states her teeth hurt.  She will start radiation treatments again tomorrow. Also has had some nausea off and on.

## 2015-10-10 ENCOUNTER — Ambulatory Visit
Admission: RE | Admit: 2015-10-10 | Discharge: 2015-10-10 | Disposition: A | Discharge status: Home / Self Care | Payer: Commercial Managed Care - HMO | Source: Ambulatory Visit | Attending: Radiation Oncology | Admitting: Radiation Oncology

## 2015-10-10 DIAGNOSIS — F17210 Nicotine dependence, cigarettes, uncomplicated: Secondary | ICD-10-CM | POA: Diagnosis not present

## 2015-10-10 DIAGNOSIS — C049 Malignant neoplasm of floor of mouth, unspecified: Secondary | ICD-10-CM | POA: Diagnosis not present

## 2015-10-10 DIAGNOSIS — C01 Malignant neoplasm of base of tongue: Secondary | ICD-10-CM | POA: Diagnosis not present

## 2015-10-10 DIAGNOSIS — Z510 Encounter for antineoplastic radiation therapy: Secondary | ICD-10-CM | POA: Diagnosis not present

## 2015-10-11 ENCOUNTER — Ambulatory Visit: Payer: Commercial Managed Care - HMO

## 2015-10-11 DIAGNOSIS — F17210 Nicotine dependence, cigarettes, uncomplicated: Secondary | ICD-10-CM | POA: Diagnosis not present

## 2015-10-11 DIAGNOSIS — C01 Malignant neoplasm of base of tongue: Secondary | ICD-10-CM | POA: Diagnosis not present

## 2015-10-11 DIAGNOSIS — C049 Malignant neoplasm of floor of mouth, unspecified: Secondary | ICD-10-CM | POA: Diagnosis not present

## 2015-10-11 DIAGNOSIS — Z510 Encounter for antineoplastic radiation therapy: Secondary | ICD-10-CM | POA: Diagnosis not present

## 2015-10-14 ENCOUNTER — Ambulatory Visit: Payer: Commercial Managed Care - HMO

## 2015-10-14 DIAGNOSIS — F17210 Nicotine dependence, cigarettes, uncomplicated: Secondary | ICD-10-CM | POA: Diagnosis not present

## 2015-10-14 DIAGNOSIS — C01 Malignant neoplasm of base of tongue: Secondary | ICD-10-CM | POA: Diagnosis not present

## 2015-10-14 DIAGNOSIS — C049 Malignant neoplasm of floor of mouth, unspecified: Secondary | ICD-10-CM | POA: Diagnosis not present

## 2015-10-14 DIAGNOSIS — Z510 Encounter for antineoplastic radiation therapy: Secondary | ICD-10-CM | POA: Diagnosis not present

## 2015-10-15 ENCOUNTER — Ambulatory Visit: Payer: Commercial Managed Care - HMO

## 2015-10-15 DIAGNOSIS — C049 Malignant neoplasm of floor of mouth, unspecified: Secondary | ICD-10-CM | POA: Diagnosis not present

## 2015-10-15 DIAGNOSIS — F17210 Nicotine dependence, cigarettes, uncomplicated: Secondary | ICD-10-CM | POA: Diagnosis not present

## 2015-10-15 DIAGNOSIS — C01 Malignant neoplasm of base of tongue: Secondary | ICD-10-CM | POA: Diagnosis not present

## 2015-10-15 DIAGNOSIS — Z510 Encounter for antineoplastic radiation therapy: Secondary | ICD-10-CM | POA: Diagnosis not present

## 2015-10-16 ENCOUNTER — Ambulatory Visit
Admission: RE | Admit: 2015-10-16 | Discharge: 2015-10-16 | Disposition: A | Discharge status: Home / Self Care | Payer: Commercial Managed Care - HMO | Source: Ambulatory Visit | Attending: Radiation Oncology | Admitting: Radiation Oncology

## 2015-10-16 ENCOUNTER — Inpatient Hospital Stay: Payer: Commercial Managed Care - HMO | Attending: Internal Medicine

## 2015-10-16 ENCOUNTER — Other Ambulatory Visit: Payer: Self-pay | Admitting: Internal Medicine

## 2015-10-16 ENCOUNTER — Ambulatory Visit: Payer: Commercial Managed Care - HMO

## 2015-10-16 ENCOUNTER — Inpatient Hospital Stay: Payer: Commercial Managed Care - HMO

## 2015-10-16 VITALS — BP 164/87 | HR 65 | Temp 96.6°F | Resp 18

## 2015-10-16 DIAGNOSIS — Z923 Personal history of irradiation: Secondary | ICD-10-CM | POA: Diagnosis not present

## 2015-10-16 DIAGNOSIS — E039 Hypothyroidism, unspecified: Secondary | ICD-10-CM | POA: Diagnosis not present

## 2015-10-16 DIAGNOSIS — E876 Hypokalemia: Secondary | ICD-10-CM | POA: Insufficient documentation

## 2015-10-16 DIAGNOSIS — C049 Malignant neoplasm of floor of mouth, unspecified: Secondary | ICD-10-CM | POA: Insufficient documentation

## 2015-10-16 DIAGNOSIS — E785 Hyperlipidemia, unspecified: Secondary | ICD-10-CM | POA: Insufficient documentation

## 2015-10-16 DIAGNOSIS — E871 Hypo-osmolality and hyponatremia: Secondary | ICD-10-CM | POA: Diagnosis not present

## 2015-10-16 DIAGNOSIS — F17210 Nicotine dependence, cigarettes, uncomplicated: Secondary | ICD-10-CM | POA: Insufficient documentation

## 2015-10-16 DIAGNOSIS — Z79899 Other long term (current) drug therapy: Secondary | ICD-10-CM | POA: Insufficient documentation

## 2015-10-16 DIAGNOSIS — Z931 Gastrostomy status: Secondary | ICD-10-CM | POA: Insufficient documentation

## 2015-10-16 DIAGNOSIS — E860 Dehydration: Secondary | ICD-10-CM

## 2015-10-16 DIAGNOSIS — I739 Peripheral vascular disease, unspecified: Secondary | ICD-10-CM | POA: Insufficient documentation

## 2015-10-16 DIAGNOSIS — K1230 Oral mucositis (ulcerative), unspecified: Secondary | ICD-10-CM | POA: Diagnosis not present

## 2015-10-16 DIAGNOSIS — I10 Essential (primary) hypertension: Secondary | ICD-10-CM | POA: Insufficient documentation

## 2015-10-16 DIAGNOSIS — Z5111 Encounter for antineoplastic chemotherapy: Secondary | ICD-10-CM | POA: Diagnosis not present

## 2015-10-16 DIAGNOSIS — R634 Abnormal weight loss: Secondary | ICD-10-CM | POA: Insufficient documentation

## 2015-10-16 DIAGNOSIS — Z510 Encounter for antineoplastic radiation therapy: Secondary | ICD-10-CM | POA: Diagnosis not present

## 2015-10-16 DIAGNOSIS — C01 Malignant neoplasm of base of tongue: Secondary | ICD-10-CM | POA: Diagnosis not present

## 2015-10-16 LAB — CBC WITH DIFFERENTIAL/PLATELET
Basophils Absolute: 0 10*3/uL (ref 0–0.1)
Basophils Relative: 1 %
Eosinophils Absolute: 0 10*3/uL (ref 0–0.7)
Eosinophils Relative: 1 %
HEMATOCRIT: 22.8 % — AB (ref 35.0–47.0)
Hemoglobin: 8.3 g/dL — ABNORMAL LOW (ref 12.0–16.0)
LYMPHS ABS: 0.7 10*3/uL — AB (ref 1.0–3.6)
LYMPHS PCT: 11 %
MCH: 37.8 pg — ABNORMAL HIGH (ref 26.0–34.0)
MCHC: 36.6 g/dL — AB (ref 32.0–36.0)
MCV: 103.4 fL — AB (ref 80.0–100.0)
MONO ABS: 0.6 10*3/uL (ref 0.2–0.9)
MONOS PCT: 9 %
NEUTROS ABS: 5.3 10*3/uL (ref 1.4–6.5)
Neutrophils Relative %: 80 %
Platelets: 187 10*3/uL (ref 150–440)
RBC: 2.21 MIL/uL — ABNORMAL LOW (ref 3.80–5.20)
RDW: 24.8 % — AB (ref 11.5–14.5)
WBC: 6.7 10*3/uL (ref 3.6–11.0)

## 2015-10-16 LAB — SAMPLE TO BLOOD BANK

## 2015-10-16 LAB — COMPREHENSIVE METABOLIC PANEL
ALBUMIN: 3.6 g/dL (ref 3.5–5.0)
ALT: 13 U/L — AB (ref 14–54)
AST: 18 U/L (ref 15–41)
Alkaline Phosphatase: 74 U/L (ref 38–126)
Anion gap: 4 — ABNORMAL LOW (ref 5–15)
BUN: 16 mg/dL (ref 6–20)
CHLORIDE: 103 mmol/L (ref 101–111)
CO2: 24 mmol/L (ref 22–32)
CREATININE: 0.59 mg/dL (ref 0.44–1.00)
Calcium: 8.2 mg/dL — ABNORMAL LOW (ref 8.9–10.3)
GFR calc Af Amer: >60 mL/min (ref >60)
GLUCOSE: 119 mg/dL — AB (ref 65–99)
POTASSIUM: 3.7 mmol/L (ref 3.5–5.1)
SODIUM: 131 mmol/L — AB (ref 135–145)
Total Bilirubin: 0.4 mg/dL (ref 0.3–1.2)
Total Protein: 6.4 g/dL — ABNORMAL LOW (ref 6.5–8.1)

## 2015-10-16 LAB — MAGNESIUM: MAGNESIUM: 1.6 mg/dL — AB (ref 1.7–2.4)

## 2015-10-16 MED ORDER — SODIUM CHLORIDE 0.9 % IV SOLN
40.0000 mg/m2 | Freq: Once | INTRAVENOUS | Status: AC
  Administered 2015-10-16: 55 mg via INTRAVENOUS
  Filled 2015-10-16: qty 55

## 2015-10-16 MED ORDER — PALONOSETRON HCL INJECTION 0.25 MG/5ML
0.2500 mg | Freq: Once | INTRAVENOUS | Status: AC
  Administered 2015-10-16: 0.25 mg via INTRAVENOUS
  Filled 2015-10-16: qty 5

## 2015-10-16 MED ORDER — HEPARIN SOD (PORK) LOCK FLUSH 100 UNIT/ML IV SOLN
500.0000 [IU] | Freq: Once | INTRAVENOUS | Status: AC | PRN
  Administered 2015-10-16: 500 [IU]
  Filled 2015-10-16: qty 5

## 2015-10-16 MED ORDER — SODIUM CHLORIDE 0.9 % IV SOLN
Freq: Once | INTRAVENOUS | Status: AC
  Administered 2015-10-16: 09:00:00 via INTRAVENOUS
  Filled 2015-10-16: qty 1000

## 2015-10-16 MED ORDER — POTASSIUM CHLORIDE 2 MEQ/ML IV SOLN
Freq: Once | INTRAVENOUS | Status: AC
  Administered 2015-10-16: 10:00:00 via INTRAVENOUS
  Filled 2015-10-16: qty 1000

## 2015-10-16 MED ORDER — SODIUM CHLORIDE 0.9 % IV SOLN: 2.0000 g | INTRAVENOUS | Status: DC | PRN

## 2015-10-16 MED ORDER — SODIUM CHLORIDE 0.9 % IV SOLN
Freq: Once | INTRAVENOUS | Status: AC
  Administered 2015-10-16: 12:00:00 via INTRAVENOUS
  Filled 2015-10-16: qty 5

## 2015-10-16 MED ORDER — SODIUM CHLORIDE 0.9% FLUSH
10.0000 mL | INTRAVENOUS | Status: DC | PRN
  Administered 2015-10-16: 10 mL
  Filled 2015-10-16: qty 10

## 2015-10-17 ENCOUNTER — Ambulatory Visit: Payer: Commercial Managed Care - HMO

## 2015-10-17 DIAGNOSIS — Z510 Encounter for antineoplastic radiation therapy: Secondary | ICD-10-CM | POA: Diagnosis not present

## 2015-10-17 DIAGNOSIS — C01 Malignant neoplasm of base of tongue: Secondary | ICD-10-CM | POA: Diagnosis not present

## 2015-10-17 DIAGNOSIS — F17210 Nicotine dependence, cigarettes, uncomplicated: Secondary | ICD-10-CM | POA: Diagnosis not present

## 2015-10-17 DIAGNOSIS — C049 Malignant neoplasm of floor of mouth, unspecified: Secondary | ICD-10-CM | POA: Diagnosis not present

## 2015-10-17 NOTE — Progress Notes (Signed)
Nutrition Follow-up:  Pt reports taking up to 2-3 cans of jevity 1.2 per day and drinking 3 boost per day.  Reports that she typically eats 1 egg with diced ham in the am (~70-90 kcals, 10 g of protein), then usually soup for lunch. Between 8am and 12 noon has 2 cans of jevity spaced out.  Reports that she is drinking boost as well.  Reports that between 2-2:30am wakes up and has bowl of cereal.  Reports drinking water during the day 2-3 16 oz bottles per day.   Pt reports some nausea today but feels that is related to chemotherapy yesterday. Reports having normal BMs 1 per day (normal consistency to hard at times). Still taking bowel regimen to promote regular BM.   Pt reports taking her about 30-45 minutes to complete feeding but going well. Tube is working well and continues to flush tube with water.    Pt reports last chemo session was yesterday and to have 7 radiation treatments after today. Treatments should be done on 8/10.     Wt: 82.lb 2oz today in the office  Noted wt 7/26 of 82 lb 4 oz  Wt on 7/12 87 lb 9 oz (inital assessment)  Labs: (7/26) Na 131, glucose 113, Mag WDL  Intervention: 1) Discussed importance of adding 4 cans of jevity 1.2 to better meet nutritional needs and prevent further wt loss.  Pt reluctant but agreeable to trying. May need to switch to jevity 1.5 if unable to increase wt.  2) Discussed importance of substituting high calorie, high protein food for lower calorie options (ie cereal and milk, oatmeal with drinking boost made with ice cream or adding extra can of jevity).  Pt verbalized understanding. Stressed importance of high calorie, high protein foods (ie jevity and boost) to increase weight.    Goal: Tube feeding tolerance and wt gain.  Next visit: Phone call during week of 8/14-8/18.  Olita Takeshita B. Zenia Resides, [place], [place] (pager) Weekend/On-Call pager [phone])

## 2015-10-18 ENCOUNTER — Other Ambulatory Visit: Payer: Self-pay | Admitting: Internal Medicine

## 2015-10-18 ENCOUNTER — Ambulatory Visit: Payer: Commercial Managed Care - HMO

## 2015-10-18 DIAGNOSIS — C049 Malignant neoplasm of floor of mouth, unspecified: Secondary | ICD-10-CM | POA: Diagnosis not present

## 2015-10-18 DIAGNOSIS — F17210 Nicotine dependence, cigarettes, uncomplicated: Secondary | ICD-10-CM | POA: Diagnosis not present

## 2015-10-18 DIAGNOSIS — R112 Nausea with vomiting, unspecified: Secondary | ICD-10-CM

## 2015-10-18 DIAGNOSIS — T451X5A Adverse effect of antineoplastic and immunosuppressive drugs, initial encounter: Principal | ICD-10-CM

## 2015-10-18 DIAGNOSIS — C01 Malignant neoplasm of base of tongue: Secondary | ICD-10-CM | POA: Diagnosis not present

## 2015-10-18 DIAGNOSIS — Z510 Encounter for antineoplastic radiation therapy: Secondary | ICD-10-CM | POA: Diagnosis not present

## 2015-10-21 ENCOUNTER — Ambulatory Visit: Payer: Commercial Managed Care - HMO

## 2015-10-21 DIAGNOSIS — F17210 Nicotine dependence, cigarettes, uncomplicated: Secondary | ICD-10-CM | POA: Diagnosis not present

## 2015-10-21 DIAGNOSIS — C01 Malignant neoplasm of base of tongue: Secondary | ICD-10-CM | POA: Diagnosis not present

## 2015-10-21 DIAGNOSIS — C029 Malignant neoplasm of tongue, unspecified: Secondary | ICD-10-CM | POA: Diagnosis not present

## 2015-10-21 DIAGNOSIS — C049 Malignant neoplasm of floor of mouth, unspecified: Secondary | ICD-10-CM | POA: Diagnosis not present

## 2015-10-21 DIAGNOSIS — Z510 Encounter for antineoplastic radiation therapy: Secondary | ICD-10-CM | POA: Diagnosis not present

## 2015-10-21 DIAGNOSIS — R634 Abnormal weight loss: Secondary | ICD-10-CM | POA: Diagnosis not present

## 2015-10-22 ENCOUNTER — Ambulatory Visit: Payer: Commercial Managed Care - HMO | Attending: Radiation Oncology

## 2015-10-22 DIAGNOSIS — Z510 Encounter for antineoplastic radiation therapy: Secondary | ICD-10-CM | POA: Diagnosis not present

## 2015-10-22 DIAGNOSIS — C01 Malignant neoplasm of base of tongue: Secondary | ICD-10-CM | POA: Diagnosis not present

## 2015-10-22 DIAGNOSIS — F17210 Nicotine dependence, cigarettes, uncomplicated: Secondary | ICD-10-CM | POA: Diagnosis not present

## 2015-10-22 DIAGNOSIS — C049 Malignant neoplasm of floor of mouth, unspecified: Secondary | ICD-10-CM | POA: Diagnosis not present

## 2015-10-23 ENCOUNTER — Other Ambulatory Visit: Payer: Self-pay | Admitting: *Deleted

## 2015-10-23 ENCOUNTER — Inpatient Hospital Stay: Payer: Commercial Managed Care - HMO

## 2015-10-23 ENCOUNTER — Ambulatory Visit: Payer: Commercial Managed Care - HMO

## 2015-10-23 ENCOUNTER — Inpatient Hospital Stay (HOSPITAL_BASED_OUTPATIENT_CLINIC_OR_DEPARTMENT_OTHER): Payer: Commercial Managed Care - HMO | Admitting: Internal Medicine

## 2015-10-23 VITALS — BP 132/60 | HR 55 | Temp 96.4°F | Resp 18 | Wt 83.0 lb

## 2015-10-23 DIAGNOSIS — C049 Malignant neoplasm of floor of mouth, unspecified: Secondary | ICD-10-CM | POA: Diagnosis not present

## 2015-10-23 DIAGNOSIS — E876 Hypokalemia: Secondary | ICD-10-CM | POA: Diagnosis not present

## 2015-10-23 DIAGNOSIS — K1230 Oral mucositis (ulcerative), unspecified: Secondary | ICD-10-CM | POA: Diagnosis not present

## 2015-10-23 DIAGNOSIS — C01 Malignant neoplasm of base of tongue: Secondary | ICD-10-CM | POA: Diagnosis not present

## 2015-10-23 DIAGNOSIS — Z5111 Encounter for antineoplastic chemotherapy: Secondary | ICD-10-CM | POA: Diagnosis not present

## 2015-10-23 DIAGNOSIS — Z931 Gastrostomy status: Secondary | ICD-10-CM | POA: Diagnosis not present

## 2015-10-23 DIAGNOSIS — C760 Malignant neoplasm of head, face and neck: Secondary | ICD-10-CM

## 2015-10-23 DIAGNOSIS — R634 Abnormal weight loss: Secondary | ICD-10-CM

## 2015-10-23 DIAGNOSIS — E871 Hypo-osmolality and hyponatremia: Secondary | ICD-10-CM

## 2015-10-23 DIAGNOSIS — Z923 Personal history of irradiation: Secondary | ICD-10-CM | POA: Diagnosis not present

## 2015-10-23 DIAGNOSIS — Z510 Encounter for antineoplastic radiation therapy: Secondary | ICD-10-CM | POA: Diagnosis not present

## 2015-10-23 DIAGNOSIS — F17210 Nicotine dependence, cigarettes, uncomplicated: Secondary | ICD-10-CM

## 2015-10-23 DIAGNOSIS — Z79899 Other long term (current) drug therapy: Secondary | ICD-10-CM

## 2015-10-23 LAB — CBC WITH DIFFERENTIAL/PLATELET
BASOS PCT: 0 %
Basophils Absolute: 0 10*3/uL (ref 0–0.1)
EOS ABS: 0.1 10*3/uL (ref 0–0.7)
EOS PCT: 1 %
HCT: 23.9 % — ABNORMAL LOW (ref 35.0–47.0)
Hemoglobin: 8.6 g/dL — ABNORMAL LOW (ref 12.0–16.0)
Lymphocytes Relative: 11 %
Lymphs Abs: 0.6 10*3/uL — ABNORMAL LOW (ref 1.0–3.6)
MCH: 37.7 pg — ABNORMAL HIGH (ref 26.0–34.0)
MCHC: 35.8 g/dL (ref 32.0–36.0)
MCV: 105.4 fL — ABNORMAL HIGH (ref 80.0–100.0)
MONO ABS: 0.5 10*3/uL (ref 0.2–0.9)
MONOS PCT: 8 %
Neutro Abs: 4.7 10*3/uL (ref 1.4–6.5)
Neutrophils Relative %: 80 %
Platelets: 181 10*3/uL (ref 150–440)
RBC: 2.27 MIL/uL — ABNORMAL LOW (ref 3.80–5.20)
RDW: 23.9 % — AB (ref 11.5–14.5)
WBC: 5.9 10*3/uL (ref 3.6–11.0)

## 2015-10-23 LAB — COMPREHENSIVE METABOLIC PANEL
ALBUMIN: 3.8 g/dL (ref 3.5–5.0)
ALT: 14 U/L (ref 14–54)
ANION GAP: 6 (ref 5–15)
AST: 17 U/L (ref 15–41)
Alkaline Phosphatase: 79 U/L (ref 38–126)
BUN: 19 mg/dL (ref 6–20)
CO2: 25 mmol/L (ref 22–32)
Calcium: 8.6 mg/dL — ABNORMAL LOW (ref 8.9–10.3)
Chloride: 98 mmol/L — ABNORMAL LOW (ref 101–111)
Creatinine, Ser: 0.63 mg/dL (ref 0.44–1.00)
GFR calc Af Amer: >60 mL/min (ref >60)
GFR calc non Af Amer: >60 mL/min (ref >60)
GLUCOSE: 137 mg/dL — AB (ref 65–99)
POTASSIUM: 4.7 mmol/L (ref 3.5–5.1)
SODIUM: 129 mmol/L — AB (ref 135–145)
TOTAL PROTEIN: 6.5 g/dL (ref 6.5–8.1)
Total Bilirubin: 0.4 mg/dL (ref 0.3–1.2)

## 2015-10-23 LAB — SAMPLE TO BLOOD BANK

## 2015-10-23 LAB — MAGNESIUM: Magnesium: 1.7 mg/dL (ref 1.7–2.4)

## 2015-10-23 NOTE — Progress Notes (Signed)
[facility] NOTE  Patient Care Team: Tracie Harrier, MD as PCP - General (Internal Medicine) Noreene Filbert, MD as Referring Physician (Radiation Oncology) Algernon Huxley, MD as Referring Physician (Vascular Surgery)  CHIEF COMPLAINTS/PURPOSE OF CONSULTATION:   Oncology History   # April 2017-SCC [STAGE IVA T4N2; Dr.McQueen; Left floor of mouth -Bx; Left neck LN-FNA+] left tongue/ floor of mouth- 4 x 3 x 2 cm.; 4.3 x 3.3 x 3 cm complex mass extends through the left mylohyoid muscle below the left mandible anterior to left submandibular gland] - START Cis [5/15]- weekly with RT [5/17]  # s/p PEG [September 06 2015]  # Smoker # weight loss     Cancer of floor of mouth ([place])   07/10/2015 Initial Diagnosis    Cancer of floor of mouth ([place])       HISTORY OF PRESENTING ILLNESS:  Rebecca Mcmillan 70 y.o.  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  Patient today currently using PEG tube.; Also Agent or more. Denies any pain.   Patient has finished 8 weekly treatments of cisplatin along with radiation- so far. She has 2 more fractions of radiation remaining.No significant  Reduction of the   Left mandibular mass noted.  Also complains of fatigue.no tingling or numbness no  Tinnitus.  ROS: A complete 10 point review of system is done which is negative except mentioned above in history of present illness  MEDICAL HISTORY:  Past Medical History:  Diagnosis Date  . Dysphagia   . G tube feedings ([place])   . Hyperlipidemia   . Hypertension   . Hypokalemia   . Hypothyroidism   . Malnourished ([place])   . Metastatic cancer ([place])   . 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 Peripheral vascular disease ([place])   . Thyroid disease     SURGICAL HISTORY: None  SOCIAL HISTORY: Patient previously used to work in nursing homes. Denies any alcohol. Smokes a pack  of Celexa day for 45 years lives with her husband in [place]. No children.  Social History   Social History  . Marital status: Married    Spouse name: N/A  . Number of children: N/A  . Years of education: N/A   Occupational History  . Not on file.   Social History Main Topics  . Smoking status: Former Smoker    Packs/day: 1.00    Years: 45.00    Types: Cigarettes    Quit date: 06/19/2015  . Smokeless tobacco: Never Used  . Alcohol use No  . Drug use: No  . Sexual activity: Not on file   Other Topics Concern  . Not on file   Social History Narrative  . No narrative on file    FAMILY HISTORY:no family history of head and neck cancer.   ALLERGIES:  is allergic to garlic; lactose intolerance (gi); and tetracyclines & related.  MEDICATIONS:  Current Outpatient Prescriptions  Medication Sig Dispense Refill  . atenolol (TENORMIN) 25 MG tablet Take 25 mg by mouth daily.     Marland Kitchen docusate sodium (COLACE) 100 MG capsule Take 100 mg by mouth 2 (two) times daily.    . fluconazole (DIFLUCAN) 100 MG tablet Take 1 tablet (100 mg total) by mouth daily. 7 tablet 0  . fluconazole (DIFLUCAN) 100 MG tablet Take 1 tablet (100 mg total) by mouth daily. 7 tablet 0  .  Gauze Pads & Dressings (DERMACEA DRAIN SPONGES) 4"X4" PADS 1 each by Does not apply route 1 day or 1 dose. 50 each 0  . Irrigation Supplies (PISTON IRRIGATION SYRINGE) MISC 1 Syringe by Does not apply route 1 day or 1 dose. 30 each 0  . levothyroxine (SYNTHROID, LEVOTHROID) 50 MCG tablet     . lidocaine-prilocaine (EMLA) cream Apply 1 application topically as needed. 30 g 6  . losartan (COZAAR) 100 MG tablet     . lovastatin (MEVACOR) 40 MG tablet Take 40 mg by mouth every evening.     . Nutritional Supplements (FEEDING SUPPLEMENT, JEVITY 1.2 CAL,) LIQD Place 237 mLs into feeding tube continuous. May use Jevity supplemental feeding 4xs a day through j-peg tube 1422 mL 12  . nystatin (MYCOSTATIN) 100000 UNIT/ML suspension Take 5 mLs  (500,000 Units total) by mouth 4 (four) times daily. 60 mL 2  . ondansetron (ZOFRAN) 8 MG tablet TAKE 1 TABLET EVERY 8 HOURS AS NEEDED FOR NAUSEA AND VOMITING (START 3 DAYS AFTER CHEMOTHERAPY) 40 tablet 0  . oxyCODONE-acetaminophen (ROXICET) 5-325 MG tablet Take 1-2 tablets by mouth every 4 (four) hours as needed for severe pain. 40 tablet 0  . polyethylene glycol (MIRALAX / GLYCOLAX) packet Take 17 g by mouth daily.    . potassium chloride (KLOR-CON) 20 MEQ packet Take 40 mEq by mouth 2 (two) times daily. X 1 week. Then 20 mcq twice daily 70 packet 3  . prochlorperazine (COMPAZINE) 10 MG tablet TAKE 1 TABLET EVERY 6 HOURS AS NEEDED FOR NAUSEA OR VOMITING. 30 tablet 0  . sucralfate (CARAFATE) 1 g tablet Take 1 tablet (1 g total) by mouth 4 (four) times daily -  with meals and at bedtime. 90 tablet 3  . Water For Irrigation, Sterile (STERILE WATER FOR IRRIGATION) Irrigate with 1,000 mLs as directed once. 1000 mL 0   No current facility-administered medications for this visit.       Marland Kitchen  PHYSICAL EXAMINATION: ECOG PERFORMANCE STATUS: 1 - Symptomatic but completely ambulatory  Vitals:   10/23/15 1416  BP: 132/60  Pulse: (!) 55  Resp: 18  Temp: (!) 96.4 F (35.8 C)   Filed Weights   10/23/15 1416  Weight: 83 lb (37.6 kg)    GENERAL: Thin built moderately nourished Caucasian female patient anxious. Alert, no distress and comfortable. She is alone.   EYES: no pallor or icterus OROPHARYNX: no thrush or ulceration; poor dentition. No obvious masses noted in the oral cavity.Thick secretions noted NECK: supple, 5-6cm mass[soft/cystic]- submandibular mass. Non-tender.  LYMPH:  no palpable lymphadenopathy xillary or inguinal regions LUNGS: Decreased breath sounds bilaterally No wheeze or crackles; barrel chested.  HEART/CVS: regular rate & rhythm and no murmurs; No lower extremity edema ABDOMEN: abdomen soft, non-tender and normal bowel sounds Musculoskeletal:no cyanosis of digits and no  clubbing  PSYCH: alert & oriented x 3 with fluent speech NEURO: no focal motor/sensory deficits SKIN:  no rashes or significant lesions  LABORATORY DATA:  I have reviewed the data as listed Lab Results  Component Value Date   WBC 5.9 10/23/2015   HGB 8.6 (L) 10/23/2015   HCT 23.9 (L) 10/23/2015   MCV 105.4 (H) 10/23/2015   PLT 181 10/23/2015    Recent Labs  10/09/15 0826 10/16/15 0809 10/23/15 1343  NA 131* 131* 129*  K 4.1 3.7 4.7  CL 103 103 98*  CO2 23 24 25   GLUCOSE 113* 119* 137*  BUN 13 16 19   CREATININE 0.59 0.59 0.63  CALCIUM 8.2* 8.2* 8.6*  GFRNONAA >60 >60 >60  GFRAA >60 >60 >60  PROT 6.4* 6.4* 6.5  ALBUMIN 3.6 3.6 3.8  AST 18 18 17   ALT 14 13* 14  ALKPHOS 71 74 79  BILITOT 0.5 0.4 0.4    ASSESSMENT & PLAN:  Cancer of floor of mouth ([facility])  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 Unresectable- Currently on concurrent chemoradiation status post 6 cycles of chemotherapy. No significant clinical improvement noted. Will finish RT on 8/11 s/p Cisplatin weekly.   # Hypokalemia- improved; hyponatremia- recommend fluids intake.   # For nutrition weight loss-  Status post PEG tube placement. On tube feeds; also on PO intake Stable.   # Recommend follow-up in 3 weeks/labs Discussed with Dr.Mcqueen- feels pt needs eval at tertiary center. Recommend pt follow up with Dr.Mcqueen.     Cammie Sickle, MD   10/23/2015 5:32 PM  .

## 2015-10-23 NOTE — Progress Notes (Unsigned)
Referral routed to Dr. Tami Ribas.

## 2015-10-23 NOTE — Assessment & Plan Note (Signed)
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 Unresectable- Currently on concurrent chemoradiation status post 6 cycles of chemotherapy. No significant clinical improvement noted. Will finish RT on 8/11 s/p Cisplatin weekly.   # Hypokalemia- improved; hyponatremia- recommend fluids intake.   # For nutrition weight loss-  Status post PEG tube placement. On tube feeds; also on PO intake Stable.   # Recommend follow-up in 3 weeks/labs Discussed with Dr.Mcqueen- feels pt needs eval at tertiary center. Recommend pt follow up with Dr.Mcqueen.

## 2015-10-24 ENCOUNTER — Other Ambulatory Visit: Payer: Self-pay | Admitting: *Deleted

## 2015-10-24 ENCOUNTER — Ambulatory Visit
Admission: RE | Admit: 2015-10-24 | Discharge: 2015-10-24 | Disposition: A | Discharge status: Home / Self Care | Payer: Commercial Managed Care - HMO | Source: Ambulatory Visit | Attending: Radiation Oncology | Admitting: Radiation Oncology

## 2015-10-24 DIAGNOSIS — C01 Malignant neoplasm of base of tongue: Secondary | ICD-10-CM | POA: Diagnosis not present

## 2015-10-24 DIAGNOSIS — C049 Malignant neoplasm of floor of mouth, unspecified: Secondary | ICD-10-CM | POA: Diagnosis not present

## 2015-10-24 DIAGNOSIS — Z510 Encounter for antineoplastic radiation therapy: Secondary | ICD-10-CM | POA: Diagnosis not present

## 2015-10-24 DIAGNOSIS — F17210 Nicotine dependence, cigarettes, uncomplicated: Secondary | ICD-10-CM | POA: Diagnosis not present

## 2015-10-24 MED ORDER — FLUCONAZOLE 100 MG PO TABS: 100.0000 mg | ORAL_TABLET | Freq: Every day | ORAL | 0 refills | Status: DC

## 2015-10-25 ENCOUNTER — Ambulatory Visit
Admission: RE | Admit: 2015-10-25 | Discharge: 2015-10-25 | Disposition: A | Discharge status: Home / Self Care | Payer: Commercial Managed Care - HMO | Source: Ambulatory Visit | Attending: Radiation Oncology | Admitting: Radiation Oncology

## 2015-10-25 DIAGNOSIS — Z510 Encounter for antineoplastic radiation therapy: Secondary | ICD-10-CM | POA: Diagnosis not present

## 2015-10-25 DIAGNOSIS — F17210 Nicotine dependence, cigarettes, uncomplicated: Secondary | ICD-10-CM | POA: Diagnosis not present

## 2015-10-25 DIAGNOSIS — C01 Malignant neoplasm of base of tongue: Secondary | ICD-10-CM | POA: Diagnosis not present

## 2015-10-25 DIAGNOSIS — C049 Malignant neoplasm of floor of mouth, unspecified: Secondary | ICD-10-CM | POA: Diagnosis not present

## 2015-10-30 ENCOUNTER — Other Ambulatory Visit: Payer: Self-pay | Admitting: Internal Medicine

## 2015-10-31 ENCOUNTER — Telehealth: Payer: Self-pay

## 2015-10-31 NOTE — Telephone Encounter (Signed)
Nutrition Follow-up:  Spoke with pt this pm via phone regarding PEG tube feeding.  Pt reports tolerating 3 full cans of jevity 1.2 and 2 cans of boost.  Reports continuing to flush tube with 42ml of water before and after feeding.  No problems with tube.  Some nausea and sore mouth following radiation on 8/9.  She reports that she is still eating soft foods but some days are better than others with oral intake.    Reports normal BM, 1 per day  Noted wt on 8/9 of 83 pounds.  Pt reports wt has been stable since our last meeting on 8/3.    Intervention: 1) Encouraged pt to give at least 4 cans of jevity 1.2 especially on days when oral intake is poor.  Follow-up: Visit planned on 9/13 at 1:30pm for wt check and tube feeding tolerance.  Jarrius [place] B. Zenia Resides, [place], [place] (pager) Weekend/On-Call pager [phone])

## 2015-11-01 NOTE — Telephone Encounter (Signed)
Error

## 2015-11-14 ENCOUNTER — Inpatient Hospital Stay: Payer: Commercial Managed Care - HMO

## 2015-11-14 ENCOUNTER — Inpatient Hospital Stay (HOSPITAL_BASED_OUTPATIENT_CLINIC_OR_DEPARTMENT_OTHER): Payer: Commercial Managed Care - HMO | Admitting: Internal Medicine

## 2015-11-14 VITALS — BP 151/73 | HR 77 | Temp 98.2°F | Resp 18 | Wt 80.5 lb

## 2015-11-14 DIAGNOSIS — Z931 Gastrostomy status: Secondary | ICD-10-CM

## 2015-11-14 DIAGNOSIS — E871 Hypo-osmolality and hyponatremia: Secondary | ICD-10-CM | POA: Diagnosis not present

## 2015-11-14 DIAGNOSIS — K1230 Oral mucositis (ulcerative), unspecified: Secondary | ICD-10-CM | POA: Diagnosis not present

## 2015-11-14 DIAGNOSIS — F17210 Nicotine dependence, cigarettes, uncomplicated: Secondary | ICD-10-CM | POA: Diagnosis not present

## 2015-11-14 DIAGNOSIS — C049 Malignant neoplasm of floor of mouth, unspecified: Secondary | ICD-10-CM

## 2015-11-14 DIAGNOSIS — Z79899 Other long term (current) drug therapy: Secondary | ICD-10-CM

## 2015-11-14 DIAGNOSIS — E876 Hypokalemia: Secondary | ICD-10-CM | POA: Diagnosis not present

## 2015-11-14 DIAGNOSIS — R634 Abnormal weight loss: Secondary | ICD-10-CM

## 2015-11-14 DIAGNOSIS — Z923 Personal history of irradiation: Secondary | ICD-10-CM | POA: Diagnosis not present

## 2015-11-14 DIAGNOSIS — Z5111 Encounter for antineoplastic chemotherapy: Secondary | ICD-10-CM | POA: Diagnosis not present

## 2015-11-14 LAB — CBC WITH DIFFERENTIAL/PLATELET
BASOS ABS: 0 10*3/uL (ref 0–0.1)
Basophils Relative: 0 %
EOS PCT: 0 %
Eosinophils Absolute: 0 10*3/uL (ref 0–0.7)
HCT: 25.6 % — ABNORMAL LOW (ref 35.0–47.0)
Hemoglobin: 9.3 g/dL — ABNORMAL LOW (ref 12.0–16.0)
LYMPHS PCT: 10 %
Lymphs Abs: 0.5 10*3/uL — ABNORMAL LOW (ref 1.0–3.6)
MCH: 40.5 pg — ABNORMAL HIGH (ref 26.0–34.0)
MCHC: 36.4 g/dL — AB (ref 32.0–36.0)
MCV: 111.4 fL — AB (ref 80.0–100.0)
MONO ABS: 0.7 10*3/uL (ref 0.2–0.9)
MONOS PCT: 14 %
Neutro Abs: 3.8 10*3/uL (ref 1.4–6.5)
Neutrophils Relative %: 76 %
PLATELETS: 252 10*3/uL (ref 150–440)
RBC: 2.3 MIL/uL — ABNORMAL LOW (ref 3.80–5.20)
RDW: 20.2 % — AB (ref 11.5–14.5)
WBC: 5 10*3/uL (ref 3.6–11.0)

## 2015-11-14 LAB — COMPREHENSIVE METABOLIC PANEL
ALBUMIN: 4.1 g/dL (ref 3.5–5.0)
ALK PHOS: 73 U/L (ref 38–126)
ALT: 12 U/L — ABNORMAL LOW (ref 14–54)
AST: 18 U/L (ref 15–41)
Anion gap: 10 (ref 5–15)
BILIRUBIN TOTAL: 0.3 mg/dL (ref 0.3–1.2)
BUN: 16 mg/dL (ref 6–20)
CALCIUM: 8.8 mg/dL — AB (ref 8.9–10.3)
CO2: 24 mmol/L (ref 22–32)
Chloride: 96 mmol/L — ABNORMAL LOW (ref 101–111)
Creatinine, Ser: 0.57 mg/dL (ref 0.44–1.00)
GFR calc Af Amer: >60 mL/min (ref >60)
GFR calc non Af Amer: >60 mL/min (ref >60)
Glucose, Bld: 121 mg/dL — ABNORMAL HIGH (ref 65–99)
POTASSIUM: 4.5 mmol/L (ref 3.5–5.1)
Sodium: 130 mmol/L — ABNORMAL LOW (ref 135–145)
TOTAL PROTEIN: 7.4 g/dL (ref 6.5–8.1)

## 2015-11-14 LAB — SAMPLE TO BLOOD BANK

## 2015-11-14 LAB — MAGNESIUM: Magnesium: 1.9 mg/dL (ref 1.7–2.4)

## 2015-11-14 MED ORDER — MAGIC MOUTHWASH W/LIDOCAINE: 5.0000 mL | Freq: Four times a day (QID) | ORAL | 3 refills | Status: DC

## 2015-11-14 NOTE — Progress Notes (Signed)
Patient states she has two front bottom teeth that are loose.  She thinks she has an infection in her mouth. States it is sore.

## 2015-11-14 NOTE — Assessment & Plan Note (Addendum)
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 ? resection Currently s/p chemoradiation status post 6 cycles of chemotherapy. No significant clinical improvement noted. finished RT on 8/11 s/p Cisplatin weekly. Re-staging PET in early- mid November.  Discussed with the patient that she needs evaluation with ENT; as per my discussion with Dr. Tami Ribas- patient likely referred higher Center for surgical evaluation.  # mucositis- recommend magic mouth wash  # For nutrition weight loss-  Status post PEG tube placement. On tube feeds; also on PO intake Stable.   # recommend referal to Dr.McQueen's office- spoke to office; will call pt for appt.   # follow up with 4 weeks/ labs-

## 2015-11-14 NOTE — Progress Notes (Signed)
[place] NOTE  Patient Care Team: Tracie Harrier, MD as PCP - General (Internal Medicine) Noreene Filbert, MD as Referring Physician (Radiation Oncology) Algernon Huxley, MD as Referring Physician (Vascular Surgery)  CHIEF COMPLAINTS/PURPOSE OF CONSULTATION:   Oncology History   # April 2017-[facility] [STAGE IVA T4N2; Dr.McQueen; Left floor of mouth -Bx; Left neck LN-FNA+] left tongue/ floor of mouth- 4 x 3 x 2 cm.; 4.3 x 3.3 x 3 cm complex mass extends through the left mylohyoid muscle below the left mandible anterior to left submandibular gland] - START Cis [5/15]- weekly with RT [5/17]  # s/p PEG [September 06 2015]  # Smoker # weight loss     Cancer of floor of mouth ([place])   07/10/2015 Initial Diagnosis    Cancer of floor of mouth ([place])        HISTORY OF PRESENTING ILLNESS:  Rebecca Mcmillan 70 y.o.  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 Her last radiation was on August 11.  Patient complains of increasing soreness in the mouth. She has been using theq approximately 4 cans a day. [At goal]. She feels extremely fatigued. Denies any tingling and numbness.No significant  Reduction of the   Left mandibular mass noted.  Also complains of fatigue.no tingling or numbness no  Tinnitus.  ROS: A complete 10 point review of system is done which is negative except mentioned above in history of present illness  MEDICAL HISTORY:  Past Medical History:  Diagnosis Date  . Dysphagia   . G tube feedings ([place])   . Hyperlipidemia   . Hypertension   . Hypokalemia   . Hypothyroidism   . Malnourished ([place])   . Metastatic cancer ([place])   . Oral-mouth cancer ([place])    tongue  . Peripheral vascular disease ([place])   . Thyroid disease     SURGICAL HISTORY: None  SOCIAL HISTORY: Patient previously used to work in nursing homes. Denies any  alcohol. Smokes a pack of Celexa day for 45 years lives with her husband in [place]. No children.  Social History   Social History  . Marital status: Married    Spouse name: N/A  . Number of children: N/A  . Years of education: N/A   Occupational History  . Not on file.   Social History Main Topics  . Smoking status: Former Smoker    Packs/day: 1.00    Years: 45.00    Types: Cigarettes    Quit date: 06/19/2015  . Smokeless tobacco: Never Used  . Alcohol use No  . Drug use: No  . Sexual activity: Not on file   Other Topics Concern  . Not on file   Social History Narrative  . No narrative on file    FAMILY HISTORY:no family history of head and neck cancer.   ALLERGIES:  is allergic to garlic; lactose intolerance (gi); and tetracyclines & related.  MEDICATIONS:  Current Outpatient Prescriptions  Medication Sig Dispense Refill  . atenolol (TENORMIN) 25 MG tablet Take 25 mg by mouth daily.     Marland Kitchen docusate sodium (COLACE) 100 MG capsule Take 100 mg by mouth 2 (two) times daily.    . fluconazole (DIFLUCAN) 100 MG tablet Take 1 tablet (100 mg total) by mouth daily. 10 tablet 0  . Gauze Pads & Dressings (DERMACEA DRAIN SPONGES) 4"X4" PADS 1 each by Does not apply route 1  day or 1 dose. 50 each 0  . Irrigation Supplies (PISTON IRRIGATION SYRINGE) MISC 1 Syringe by Does not apply route 1 day or 1 dose. 30 each 0  . levothyroxine (SYNTHROID, LEVOTHROID) 50 MCG tablet     . lidocaine-prilocaine (EMLA) cream Apply 1 application topically as needed. 30 g 6  . losartan (COZAAR) 100 MG tablet     . lovastatin (MEVACOR) 40 MG tablet Take 40 mg by mouth every evening.     . Nutritional Supplements (FEEDING SUPPLEMENT, JEVITY 1.2 CAL,) LIQD Place 237 mLs into feeding tube continuous. May use Jevity supplemental feeding 4xs a day through j-peg tube 1422 mL 12  . nystatin (MYCOSTATIN) 100000 UNIT/ML suspension Take 5 mLs (500,000 Units total) by mouth 4 (four) times daily. 60 mL 2  .  ondansetron (ZOFRAN) 8 MG tablet TAKE 1 TABLET EVERY 8 HOURS AS NEEDED FOR NAUSEA AND VOMITING (START 3 DAYS AFTER CHEMOTHERAPY) 40 tablet 0  . oxyCODONE-acetaminophen (ROXICET) 5-325 MG tablet Take 1-2 tablets by mouth every 4 (four) hours as needed for severe pain. 40 tablet 0  . polyethylene glycol (MIRALAX / GLYCOLAX) packet Take 17 g by mouth daily.    . potassium chloride (KLOR-CON) 20 MEQ packet Take 40 mEq by mouth 2 (two) times daily. X 1 week. Then 20 mcq twice daily 70 packet 3  . prochlorperazine (COMPAZINE) 10 MG tablet TAKE 1 TABLET EVERY 6 HOURS AS NEEDED FOR NAUSEA OR VOMITING. 30 tablet 0  . sucralfate (CARAFATE) 1 g tablet Take 1 tablet (1 g total) by mouth 4 (four) times daily -  with meals and at bedtime. 90 tablet 3  . Water For Irrigation, Sterile (STERILE WATER FOR IRRIGATION) Irrigate with 1,000 mLs as directed once. 1000 mL 0  . magic mouthwash w/lidocaine SOLN Take 5 mLs by mouth 4 (four) times daily. 80 ml viscous lidocaine 2%, 80 ml Mylanta, 80 ml Diphenhydramine 12.5 mg/5 ml Elixir, 80 ml Nystatin 100,000 Unit suspension, 80 ml Prednisolone 15 mg/31ml, 80 ml Distilled Water. Sig: Swish/Swallow 5-10 ml four times a day as needed. Dispense 480 ml. 3RFs 480 mL 3   No current facility-administered medications for this visit.       Marland Kitchen  PHYSICAL EXAMINATION: ECOG PERFORMANCE STATUS: 1 - Symptomatic but completely ambulatory  Vitals:   11/14/15 0950  BP: (!) 151/73  Pulse: 77  Resp: 18  Temp: 98.2 F (36.8 C)   Filed Weights   11/14/15 0950  Weight: 80 lb 8 oz (36.5 kg)    GENERAL: Thin built moderately nourished Caucasian female patient anxious. Alert, no distress and comfortable. She is alone.   EYES: no pallor or icterus OROPHARYNX: no thrush or ulceration; poor dentition. No obvious masses noted in the oral cavity.Thick secretions noted NECK: supple, 5-6cm mass[soft/cystic]- submandibular mass. Non-tender.  LYMPH:  no palpable lymphadenopathy xillary or  inguinal regions LUNGS: Decreased breath sounds bilaterally No wheeze or crackles; barrel chested.  HEART/CVS: regular rate & rhythm and no murmurs; No lower extremity edema ABDOMEN: abdomen soft, non-tender and normal bowel sounds Musculoskeletal:no cyanosis of digits and no clubbing  PSYCH: alert & oriented x 3 with fluent speech NEURO: no focal motor/sensory deficits SKIN:  no rashes or significant lesions  LABORATORY DATA:  I have reviewed the data as listed Lab Results  Component Value Date   WBC 5.0 11/14/2015   HGB 9.3 (L) 11/14/2015   HCT 25.6 (L) 11/14/2015   MCV 111.4 (H) 11/14/2015   PLT 252 11/14/2015  Recent Labs  10/16/15 0809 10/23/15 1343 11/14/15 0928  NA 131* 129* 130*  K 3.7 4.7 4.5  CL 103 98* 96*  CO2 24 25 24   GLUCOSE 119* 137* 121*  BUN 16 19 16   CREATININE 0.59 0.63 0.57  CALCIUM 8.2* 8.6* 8.8*  GFRNONAA >60 >60 >60  GFRAA >60 >60 >60  PROT 6.4* 6.5 7.4  ALBUMIN 3.6 3.8 4.1  AST 18 17 18   ALT 13* 14 12*  ALKPHOS 74 79 73  BILITOT 0.4 0.4 0.3    ASSESSMENT & PLAN:  Cancer of floor of mouth ([facility])  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 ? resection Currently s/p chemoradiation status post 6 cycles of chemotherapy. No significant clinical improvement noted. finished RT on 8/11 s/p Cisplatin weekly. Re-staging PET in early- mid November.  Discussed with the patient that she needs evaluation with ENT; as per my discussion with Dr. Tami Ribas- patient likely referred higher Center for surgical evaluation.  # mucositis- recommend magic mouth wash  # For nutrition weight loss-  Status post PEG tube placement. On tube feeds; also on PO intake Stable.   # recommend referal to Dr.McQueen's office- spoke to office; will call pt for appt.   # follow up with 4 weeks/ labs-   # 25 minutes face-to-face with the patient discussing the above plan of care; more than 50% of time spent on  prognosis/ natural history; counseling and coordination.   Cammie Sickle, MD   11/14/2015 4:52 PM  .

## 2015-11-22 ENCOUNTER — Emergency Department: Payer: Commercial Managed Care - HMO

## 2015-11-22 ENCOUNTER — Encounter: Payer: Self-pay | Admitting: Emergency Medicine

## 2015-11-22 ENCOUNTER — Emergency Department
Admission: EM | Admit: 2015-11-22 | Discharge: 2015-11-23 | Disposition: A | Discharge status: Home / Self Care | Payer: Commercial Managed Care - HMO | Attending: Emergency Medicine | Admitting: Emergency Medicine

## 2015-11-22 DIAGNOSIS — R05 Cough: Secondary | ICD-10-CM | POA: Diagnosis not present

## 2015-11-22 DIAGNOSIS — Z85810 Personal history of malignant neoplasm of tongue: Secondary | ICD-10-CM | POA: Insufficient documentation

## 2015-11-22 DIAGNOSIS — Z87891 Personal history of nicotine dependence: Secondary | ICD-10-CM | POA: Diagnosis not present

## 2015-11-22 DIAGNOSIS — K228 Other specified diseases of esophagus: Secondary | ICD-10-CM

## 2015-11-22 DIAGNOSIS — K2289 Other specified disease of esophagus: Secondary | ICD-10-CM

## 2015-11-22 DIAGNOSIS — K229 Disease of esophagus, unspecified: Secondary | ICD-10-CM | POA: Insufficient documentation

## 2015-11-22 DIAGNOSIS — Z931 Gastrostomy status: Secondary | ICD-10-CM | POA: Insufficient documentation

## 2015-11-22 DIAGNOSIS — I10 Essential (primary) hypertension: Secondary | ICD-10-CM | POA: Insufficient documentation

## 2015-11-22 DIAGNOSIS — E039 Hypothyroidism, unspecified: Secondary | ICD-10-CM | POA: Diagnosis not present

## 2015-11-22 DIAGNOSIS — R1310 Dysphagia, unspecified: Secondary | ICD-10-CM | POA: Diagnosis present

## 2015-11-22 LAB — BASIC METABOLIC PANEL
Anion gap: 12 (ref 5–15)
BUN: 20 mg/dL (ref 6–20)
CHLORIDE: 93 mmol/L — AB (ref 101–111)
CO2: 24 mmol/L (ref 22–32)
CREATININE: 0.55 mg/dL (ref 0.44–1.00)
Calcium: 9.2 mg/dL (ref 8.9–10.3)
GFR calc Af Amer: >60 mL/min (ref >60)
GFR calc non Af Amer: >60 mL/min (ref >60)
GLUCOSE: 94 mg/dL (ref 65–99)
Potassium: 3.9 mmol/L (ref 3.5–5.1)
Sodium: 129 mmol/L — ABNORMAL LOW (ref 135–145)

## 2015-11-22 LAB — CBC WITH DIFFERENTIAL/PLATELET
Basophils Absolute: 0.1 10*3/uL (ref 0–0.1)
Basophils Relative: 1 %
EOS ABS: 0 10*3/uL (ref 0–0.7)
EOS PCT: 0 %
HEMATOCRIT: 28.5 % — AB (ref 35.0–47.0)
HEMOGLOBIN: 10.1 g/dL — AB (ref 12.0–16.0)
LYMPHS ABS: 0.8 10*3/uL — AB (ref 1.0–3.6)
Lymphocytes Relative: 9 %
MCH: 38.9 pg — AB (ref 26.0–34.0)
MCHC: 35.6 g/dL (ref 32.0–36.0)
MCV: 109.2 fL — ABNORMAL HIGH (ref 80.0–100.0)
MONO ABS: 1 10*3/uL — AB (ref 0.2–0.9)
MONOS PCT: 12 %
NEUTROS PCT: 78 %
Neutro Abs: 6.7 10*3/uL — ABNORMAL HIGH (ref 1.4–6.5)
Platelets: 290 10*3/uL (ref 150–440)
RBC: 2.61 MIL/uL — ABNORMAL LOW (ref 3.80–5.20)
RDW: 18.2 % — ABNORMAL HIGH (ref 11.5–14.5)
WBC: 8.5 10*3/uL (ref 3.6–11.0)

## 2015-11-22 MED ORDER — OXYCODONE HCL 5 MG/5ML PO SOLN
5.0000 mg | Freq: Once | ORAL | Status: AC
  Administered 2015-11-22: 5 mg via ORAL
  Filled 2015-11-22: qty 5

## 2015-11-22 MED ORDER — LIDOCAINE VISCOUS 2 % MT SOLN
15.0000 mL | Freq: Once | OROMUCOSAL | Status: AC
  Administered 2015-11-22: 15 mL via OROMUCOSAL
  Filled 2015-11-22: qty 15

## 2015-11-22 MED ORDER — SODIUM CHLORIDE 0.9 % IV BOLUS (SEPSIS)
1000.0000 mL | Freq: Once | INTRAVENOUS | Status: AC
  Administered 2015-11-22: 1000 mL via INTRAVENOUS

## 2015-11-22 NOTE — Discharge Instructions (Signed)
As we discussed, I suspect your throat and swallowing pain is due to likely side effects from radiation.  I am adding liquid pain medication for you and continue your Magic mouthwash.  In terms of the weight loss, you do need to keep your appointment with the nutritionist to make sure you're meeting your caloric needs.  Return to the emergency department for any worsening condition including any trouble breathing, any worsening trouble swallowing, fever, not making urine, confusion or altered mental status, or any other symptoms concerning to you.

## 2015-11-22 NOTE — ED Notes (Signed)
Pt  Reports she has Mouth CA. This patient  finished chemo and radiation last month.  Pt. reports for the past week has had a cough green/yellow feels heavy breathing but not SOB. Pt states cant swallow any food or drink for the last week.   Pt denies NVD.

## 2015-11-22 NOTE — ED Triage Notes (Signed)
Pt. States she is a Mouth CA patient who finished chemo and radiation last month.  Pt. States for the past week has had a cough and difficulty swallowing food.  Pt. States decreased appetite in last week. Pt. Denies N/V/D.

## 2015-11-22 NOTE — ED Provider Notes (Signed)
[facility] Emergency Department Provider Note ____________________________________________   I have reviewed the triage vital signs and the triage nursing note.  HISTORY  Chief Complaint Dysphagia (Pt. states she is a CA patient(mouth CA).  Pt. states difficulty swallowing in the last couple days.)   Historian Patient and husband   HPI Rebecca Mcmillan is a 70 y.o. female with a history of dysphasia due to metastatic tongue cancer status post recent radiation and chemotherapy completed. States that her next imaging is supposed to be in November. She's had thrush and does take magic mouthwash with lidocaine 3-4 times daily. The mass has actually shrunk in size. There is no airway issues or trouble breathing. She has lost a significant amount of weight due to inability to really take by mouth. She certainly can't take down solids and is having trouble with liquids now. She does have a G-tube for which she takes nutrition, but she's not sure that adequate.  No fevers. She's had intermittent cough.  Pain at the posterior neck is moderate to severe.    Past Medical History:  Diagnosis Date  . Dysphagia   . G tube feedings ([place])   . Hyperlipidemia   . Hypertension   . Hypokalemia   . Hypothyroidism   . Malnourished ([place])   . Metastatic cancer ([place])   . 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 Peripheral vascular disease ([place])   . Thyroid disease     Patient Active Problem List   Diagnosis Date Noted  . Hypomagnesemia 09/11/2015  . Hypokalemia 09/11/2015  . Thrush 08/28/2015  . Dehydration 08/28/2015  . Weight loss, abnormal 08/27/2015  . Cancer of floor of mouth ([place]) 07/10/2015  . HLD (hyperlipidemia) 06/18/2015  . BP (high blood pressure) 06/18/2015  . Current tobacco use 06/18/2015  . Carotid artery bruit 07/25/2014  . Acquired hypothyroidism 01/09/2014    Past Surgical History:  Procedure Laterality Date  . ABDOMINAL HYSTERECTOMY    . FINE  NEEDLE ASPIRATION N/A 07/02/2015   Procedure: FINE NEEDLE ASPIRATION;  Surgeon: Beverly Gust, MD;  Location: [facility] ORS;  Service: ENT;  Laterality: N/A;  . FINGER SURGERY Right    thumb  . PERIPHERAL VASCULAR CATHETERIZATION N/A 07/15/2015   Procedure: Glori Luis Cath Insertion;  Surgeon: Algernon Huxley, MD;  Location: [facility];  Service: Cardiovascular;  Laterality: N/A;  . TONGUE BIOPSY N/A 07/02/2015   Procedure: TONGUE BIOPSY;  Surgeon: Beverly Gust, MD;  Location: [facility] ORS;  Service: ENT;  Laterality: N/A;    Prior to Admission medications   Medication Sig Start Date End Date Taking? Authorizing Provider  atenolol (TENORMIN) 25 MG tablet Take 25 mg by mouth daily.  05/16/15   Historical Provider, MD  docusate sodium (COLACE) 100 MG capsule Take 100 mg by mouth 2 (two) times daily.    Historical Provider, MD  fluconazole (DIFLUCAN) 100 MG tablet Take 1 tablet (100 mg total) by mouth daily. 10/24/15   Noreene Filbert, MD  Gauze Pads & Dressings ([place]) 4"X4" PADS 1 each by Does not apply route 1 day or 1 dose. 09/05/15   Cammie Sickle, MD  Irrigation Supplies (PISTON IRRIGATION SYRINGE) MISC 1 Syringe by Does not apply route 1 day or 1 dose. 09/05/15   Cammie Sickle, MD  levothyroxine (SYNTHROID, LEVOTHROID) 50 MCG tablet  06/14/15   Historical Provider, MD  lidocaine-prilocaine (EMLA) cream Apply 1 application topically as needed. 07/10/15   Cammie Sickle, MD  losartan (COZAAR) 100 MG tablet  06/13/15   Historical Provider, MD  lovastatin (MEVACOR) 40 MG tablet Take 40 mg by mouth every evening.  05/16/15   Historical Provider, MD  magic mouthwash w/lidocaine SOLN Take 5 mLs by mouth 4 (four) times daily. 80 ml viscous lidocaine 2%, 80 ml Mylanta, 80 ml Diphenhydramine 12.5 mg/5 ml Elixir, 80 ml Nystatin 100,000 Unit suspension, 80 ml Prednisolone 15 mg/53ml, 80 ml Distilled Water. Sig: Swish/Swallow 5-10 ml four times a day as needed. Dispense 480 ml. 3RFs  11/14/15   Cammie Sickle, MD  Nutritional Supplements (FEEDING SUPPLEMENT, JEVITY 1.2 CAL,) LIQD Place 237 mLs into feeding tube continuous. May use Jevity supplemental feeding 4xs a day through j-peg tube 09/05/15   Cammie Sickle, MD  nystatin (MYCOSTATIN) 100000 UNIT/ML suspension Take 5 mLs (500,000 Units total) by mouth 4 (four) times daily. 09/16/15   Cammie Sickle, MD  ondansetron (ZOFRAN) 8 MG tablet TAKE 1 TABLET EVERY 8 HOURS AS NEEDED FOR NAUSEA AND VOMITING (START 3 DAYS AFTER CHEMOTHERAPY) 09/13/15   Cammie Sickle, MD  oxyCODONE-acetaminophen (ROXICET) 5-325 MG tablet Take 1-2 tablets by mouth every 4 (four) hours as needed for severe pain. 07/02/15   Beverly Gust, MD  polyethylene glycol [facility] / Floria Raveling) packet Take 17 g by mouth daily.    Historical Provider, MD  potassium chloride (KLOR-CON) 20 MEQ packet Take 40 mEq by mouth 2 (two) times daily. X 1 week. Then 20 mcq twice daily 09/25/15   Cammie Sickle, MD  prochlorperazine (COMPAZINE) 10 MG tablet TAKE 1 TABLET EVERY 6 HOURS AS NEEDED FOR NAUSEA OR VOMITING. 10/18/15   Cammie Sickle, MD  sucralfate (CARAFATE) 1 g tablet Take 1 tablet (1 g total) by mouth 4 (four) times daily -  with meals and at bedtime. 08/20/15   Noreene Filbert, MD  Water For Irrigation, Sterile (STERILE WATER FOR IRRIGATION) Irrigate with 1,000 mLs as directed once. 09/05/15   Cammie Sickle, MD    Allergies  Allergen Reactions  . Garlic Swelling    "Eyes swell up"  . Lactose Intolerance (Gi)   . Tetracyclines & Related     Per patient breaks out in hives given 5-6 years ago pt doesn't remember where this happened     History reviewed. No pertinent family history.  Social History Social History  Substance Use Topics  . Smoking status: Former Smoker    Packs/day: 1.00    Years: 45.00    Types: Cigarettes    Quit date: 06/19/2015  . Smokeless tobacco: Never Used  . Alcohol use No    Review of  Systems  Constitutional: Negative for fever. Eyes: Negative for visual changes. ENT: Positive for sore throat. Cardiovascular: Negative for chest pain. Respiratory: Negative for shortness of breath. Gastrointestinal: Negative for abdominal pain, vomiting and diarrhea. Genitourinary: Negative for dysuria. Musculoskeletal: Negative for back pain. Skin: Negative for rash. Neurological: Negative for headache. 10 point Review of Systems otherwise negative ____________________________________________   PHYSICAL EXAM:  VITAL SIGNS: ED Triage Vitals  Enc Vitals Group     BP 11/22/15 1916 (!) 144/99     Pulse Rate 11/22/15 1916 94     Resp 11/22/15 1916 18     Temp 11/22/15 1916 98.2 F (36.8 C)     Temp Source 11/22/15 1916 Axillary     SpO2 11/22/15 1916 98 %     Weight 11/22/15 1917 76 lb 9.6 oz (34.7 kg)     Height  11/22/15 1917 5' (1.524 m)     Head Circumference --      Peak Flow --      Pain Score 11/22/15 1918 6     Pain Loc --      Pain Edu? --      Excl. in [place]? --      Constitutional: Alert and oriented. Well appearing and in no distress. HEENT   Head: Normocephalic and atraumatic.      Eyes: Conjunctivae are normal. PERRL. Normal extraocular movements.      Ears:         Nose: No congestion/rhinnorhea.   Mouth/Throat: Mucous membranes are moist.   Neck: No stridor.  Large visible anterior and left-sided neck mass. Cardiovascular/Chest: Normal rate, regular rhythm.  No murmurs, rubs, or gallops. Respiratory: Normal respiratory effort without tachypnea nor retractions. Breath sounds are clear and equal bilaterally. No wheezes/rales/rhonchi. Gastrointestinal: Soft. No distention, no guarding, no rebound. Nontender.    Genitourinary/rectal:Deferred Musculoskeletal: Nontender with normal range of motion in all extremities. No joint effusions.  No lower extremity tenderness.  No edema. Neurologic:  Normal speech and language. No gross or focal neurologic  deficits are appreciated. Skin:  Skin is warm, dry and intact. No rash noted. Psychiatric: Mood and affect are normal. Speech and behavior are normal. Patient exhibits appropriate insight and judgment.   ____________________________________________  LABS (pertinent positives/negatives)  Labs Reviewed  BASIC METABOLIC PANEL - Abnormal; Notable for the following:       Result Value   Sodium 129 (*)    Chloride 93 (*)    All other components within normal limits  CBC WITH DIFFERENTIAL/PLATELET - Abnormal; Notable for the following:    RBC 2.61 (*)    Hemoglobin 10.1 (*)    HCT 28.5 (*)    MCV 109.2 (*)    MCH 38.9 (*)    RDW 18.2 (*)    Neutro Abs 6.7 (*)    Lymphs Abs 0.8 (*)    Monocytes Absolute 1.0 (*)    All other components within normal limits    ____________________________________________    EKG I, Lisa Roca, MD, the attending physician have personally viewed and interpreted all ECGs.  None ____________________________________________  RADIOLOGY All Xrays were viewed by me. Imaging interpreted by Radiologist.  Chest x-ray two-view: COPD, no acute cardio pulmonary process. __________________________________________  PROCEDURES  Procedure(s) performed: None  Critical Care performed: None  ____________________________________________   ED COURSE / ASSESSMENT AND PLAN  Pertinent labs & imaging results that were available during my care of the patient were reviewed by me and considered in my medical decision making (see chart for details).   Ms. Shilling is here with symptoms that sound like esophagitis likely related to post radiation and her known tongue/neck cancer. She does already have a G-tube and takes feeds through that, but states that she still losing weight. She does have a nutritionist appointment this coming week. She is not on any pain medications at home and states that she certainly can't take any pills.  Laboratory studies do not  indicate acute renal failure. I did give her 1 L of normal saline for hydration based on history concerning for possible dehydration. She has not been on pain medication, and I did give her a dose of oxycodone liquid, 5 mg here which provided decent relief for which she was able to then take water down.  I am going to prescribe her oxycodone liquid for pain control. She is to follow with  her oncologist, primary care physician, and nutritionist next week.\  X-ray was obtained due to some intermittent cough, but this was clear in terms of no infiltrate/pneumonia.  Family reports the mass visibly is smaller, and she is describing painful razor blade type of pain rather than fullness or any airway concern and I'm not suspicious at this point for physical obstruction.   CONSULTATIONS:   None   Patient / Family / Caregiver informed of clinical course, medical decision-making process, and agree with plan.   I discussed return precautions, follow-up instructions, and discharge instructions with patient and/or family.   ___________________________________________   FINAL CLINICAL IMPRESSION(S) / ED DIAGNOSES   Final diagnoses:  Esophageal pain              Note: This dictation was prepared with Dragon dictation. Any transcriptional errors that result from this process are unintentional    Lisa Roca, MD 11/23/15 0010

## 2015-11-22 NOTE — ED Notes (Signed)
MD at bedside. 

## 2015-11-23 MED ORDER — OXYCODONE HCL 5 MG/5ML PO SOLN: ORAL | 0 refills | Status: DC

## 2015-11-23 NOTE — ED Notes (Signed)
Discharge instructions reviewed with patient. Questions fielded by this RN. Patient verbalizes understanding of instructions. Patient discharged home in stable condition per Beather Arbour MD . No acute distress noted at time of discharge.

## 2015-11-27 ENCOUNTER — Other Ambulatory Visit: Payer: Self-pay | Admitting: *Deleted

## 2015-11-27 ENCOUNTER — Encounter: Payer: Self-pay | Admitting: Radiation Oncology

## 2015-11-27 ENCOUNTER — Ambulatory Visit
Admission: RE | Admit: 2015-11-27 | Discharge: 2015-11-27 | Disposition: A | Discharge status: Home / Self Care | Payer: Commercial Managed Care - HMO | Source: Ambulatory Visit | Attending: Radiation Oncology | Admitting: Radiation Oncology

## 2015-11-27 VITALS — BP 153/87 | HR 91 | Temp 97.4°F | Wt 80.1 lb

## 2015-11-27 DIAGNOSIS — Z9221 Personal history of antineoplastic chemotherapy: Secondary | ICD-10-CM | POA: Insufficient documentation

## 2015-11-27 DIAGNOSIS — C049 Malignant neoplasm of floor of mouth, unspecified: Secondary | ICD-10-CM

## 2015-11-27 DIAGNOSIS — K1230 Oral mucositis (ulcerative), unspecified: Secondary | ICD-10-CM | POA: Diagnosis not present

## 2015-11-27 DIAGNOSIS — Z923 Personal history of irradiation: Secondary | ICD-10-CM | POA: Diagnosis not present

## 2015-11-27 DIAGNOSIS — R531 Weakness: Secondary | ICD-10-CM | POA: Insufficient documentation

## 2015-11-27 DIAGNOSIS — R5383 Other fatigue: Secondary | ICD-10-CM | POA: Diagnosis not present

## 2015-11-27 DIAGNOSIS — C01 Malignant neoplasm of base of tongue: Secondary | ICD-10-CM | POA: Diagnosis not present

## 2015-11-27 DIAGNOSIS — R4702 Dysphasia: Secondary | ICD-10-CM | POA: Diagnosis not present

## 2015-11-27 DIAGNOSIS — J449 Chronic obstructive pulmonary disease, unspecified: Secondary | ICD-10-CM | POA: Diagnosis not present

## 2015-11-27 DIAGNOSIS — J069 Acute upper respiratory infection, unspecified: Secondary | ICD-10-CM | POA: Diagnosis not present

## 2015-11-27 MED ORDER — AZITHROMYCIN 250 MG PO TABS: ORAL_TABLET | ORAL | 0 refills | Status: DC

## 2015-11-27 NOTE — Progress Notes (Signed)
Radiation Oncology Follow up Note  Name: Rebecca Mcmillan   Date:   11/27/2015 MRN:  [id number] DOB: [birth year]    This 70 y.o. female presents to the clinic today for one-month follow-up status post chemoradiation for a T4 N2 base of tongue squamous cell carcinoma.  REFERRING PROVIDER: Tracie Harrier, MD  HPI: Patient is a 70 year old female now out 1 month having some pleated combined modality treatment with chemotherapy and radiation therapy for T4 N2 squamous cell carcinoma the base of tongue.. She had massive adenopathy in the lower submandibular region on the left which has persisted although is probably necrotic tumor at this point. She was recent seen in the emergency room for dysphasia and was given narcotic analgesics for that. She's having some loose teeth in the anterior mouth area which was actually spared by radiation. She is also currently on Magic mouthwash for some mild oral mucositis.  COMPLICATIONS OF TREATMENT: none  FOLLOW UP COMPLIANCE: keeps appointments   PHYSICAL EXAM:  BP (!) 153/87   Pulse 91   Temp 97.4 F (36.3 C)   Wt 80 lb 2.2 oz (36.4 kg)   BMI 15.65 kg/m  Patient has marked trismus making oral examination difficult no oral mucosal lesions are identified. Indirect mirror examination shows base of tongue fairly unremarkable. She is a large confluent nodularity in the left submandibular area consistent with known original tumor involvement. No other cervical subject gastric adenopathy is identified. Well-developed well-nourished patient in NAD. HEENT reveals PERLA, EOMI, discs not visualized.  Oral cavity is clear. No oral mucosal lesions are identified. Neck is clear without evidence of cervical or supraclavicular adenopathy. Lungs are clear to A&P. Cardiac examination is essentially unremarkable with regular rate and rhythm without murmur rub or thrill. Abdomen is benign with no organomegaly or masses noted. Motor sensory and DTR levels are equal and  symmetric in the upper and lower extremities. Cranial nerves II through XII are grossly intact. Proprioception is intact. No peripheral adenopathy or edema is identified. No motor or sensory levels are noted. Crude visual fields are within normal range.  RADIOLOGY RESULTS: Plain films performed last week emergency room shows COPD no acute cortical pulmonary process.  PLAN: At the present time she is quite frail and weak. She is seen ENT tomorrow. I would probably recommend resection of this left submandibular mass at some point. She also needs imaging at her head and neck area although too early to do a PET CT scan on her. I am starting her on a Z-Pak for cough and some upper respiratory symptoms. I'm also scheduling her to be seen at [facility] for possible teeth extraction. I've asked to see her back in 4-5 months for follow-up. She knows to call sooner with any concerns.  I would like to take this opportunity to thank you for allowing me to participate in the care of your patient.Armstead Peaks., MD

## 2015-11-27 NOTE — Progress Notes (Signed)
Nutrition Follow-up:  Pt seen at the [facility] today accompanied by husband.  Chart reviewed and noted ED visit on 9/8 for dysphagia and cough, noted no pneumonia per ED note.  Noted per ED note pt with 2 loose bottom teeth. Planning to see Dr. Donella Stade today, follow-up with ENT tomorrow and another PET scan in November for re-staging  Pt reports that for the past 2 weeks she and not been feeling well, today best she has felt in the last 2 weeks.  Reports over the last 2 weeks has not been able to take much nutrition via po.  Typically has been able to drink boost orally but recently feels like snot in her mouth if she drinks it.  Has still been able to drink water and reports she drinks 4-5 16 oz containers per day.  Reports this am ate oatmeal and eggs for breakfast.    Reports that she has been taking 3 cans of the jevity 1.2 per day and flushing tube with 52ml of water before and after each feeding.   Medications: colace BID, magic mouthwash, nystatin, miralax, carafate  Labs: Na 129 (9/8), hgb 10.1  Gastrointestional profile:  Pt reports BM every other day.     Wt: 80 lb today in the office  Wt last visit on 8/3 82 lb Wt on 7/11 assessment 87 lb  Re-Estimated Energy Needs  Kcals: 1350-1500 kcals/d  Protein: 45-57 g/d Fluid: >/=1328ml  NUTRITION DIAGNOSIS: Unintentional wt loss related to cancer and cancer related treatments as evidenced by continued wt loss.  GOAL: Pt to meet at least 90% of estimated nutritional needs, not meeting nutritional needs  MONITOR: tube feeding tolerance and wt trends  INTERVENTION: -Recommend changing from jevity 1.2 to jevity 1.5 (higher calorie formula) 4-5 cans per day. Will provide [id number] kcals/d, 60-75 g of protein and 720-953ml free water/d.  If unable to take anything via mouth will need to increase free water flush to 54ml before and after feeding to better meet hydration needs.  Will discuss with [place] and MD to sign new  order for jevity 1.5 bolus feeding 5 cans per day -Discussed with pt and husband that she should only count what she is able to take orally as pleasure feedings.  All of nutrition should be coming from the PEG tube at this time to increase weight. Stressed to pt importance of sitting up during feeding and at least 1 hour after feeding to prevent aspiration.   NEXT VISIT: Phone call in 1-2 weeks  Dareen Gutzwiller B. Zenia Resides, [place], [place] (pager) Weekend/On-Call pager [phone])

## 2015-11-28 ENCOUNTER — Encounter: Payer: Self-pay | Admitting: *Deleted

## 2015-11-28 ENCOUNTER — Other Ambulatory Visit: Payer: Self-pay | Admitting: Unknown Physician Specialty

## 2015-11-28 DIAGNOSIS — C049 Malignant neoplasm of floor of mouth, unspecified: Secondary | ICD-10-CM | POA: Diagnosis not present

## 2015-11-28 DIAGNOSIS — C040 Malignant neoplasm of anterior floor of mouth: Secondary | ICD-10-CM

## 2015-11-28 DIAGNOSIS — R221 Localized swelling, mass and lump, neck: Secondary | ICD-10-CM

## 2015-11-28 NOTE — Progress Notes (Signed)
Feeding tube orders for jevity faxed fax to [phone] [facility] attn. Corene Cornea

## 2015-11-29 DIAGNOSIS — C029 Malignant neoplasm of tongue, unspecified: Secondary | ICD-10-CM | POA: Diagnosis not present

## 2015-11-29 DIAGNOSIS — R634 Abnormal weight loss: Secondary | ICD-10-CM | POA: Diagnosis not present

## 2015-12-05 ENCOUNTER — Encounter
Admission: RE | Admit: 2015-12-05 | Discharge: 2015-12-05 | Disposition: A | Discharge status: Home / Self Care | Payer: Commercial Managed Care - HMO | Source: Ambulatory Visit | Attending: Unknown Physician Specialty | Admitting: Unknown Physician Specialty

## 2015-12-05 DIAGNOSIS — C040 Malignant neoplasm of anterior floor of mouth: Secondary | ICD-10-CM | POA: Diagnosis not present

## 2015-12-05 DIAGNOSIS — R221 Localized swelling, mass and lump, neck: Secondary | ICD-10-CM | POA: Diagnosis not present

## 2015-12-05 DIAGNOSIS — C760 Malignant neoplasm of head, face and neck: Secondary | ICD-10-CM | POA: Diagnosis not present

## 2015-12-05 LAB — GLUCOSE, CAPILLARY: GLUCOSE-CAPILLARY: 67 mg/dL (ref 65–99)

## 2015-12-05 MED ORDER — FLUDEOXYGLUCOSE F - 18 (FDG) INJECTION
12.0000 | Freq: Once | INTRAVENOUS | Status: AC | PRN
  Administered 2015-12-05: 13.018 via INTRAVENOUS

## 2015-12-12 ENCOUNTER — Inpatient Hospital Stay: Payer: Commercial Managed Care - HMO

## 2015-12-12 ENCOUNTER — Inpatient Hospital Stay: Payer: Commercial Managed Care - HMO | Attending: Internal Medicine | Admitting: Internal Medicine

## 2015-12-12 VITALS — BP 121/80 | HR 100 | Temp 96.9°F | Resp 16 | Ht 60.0 in | Wt 74.5 lb

## 2015-12-12 DIAGNOSIS — C049 Malignant neoplasm of floor of mouth, unspecified: Secondary | ICD-10-CM | POA: Insufficient documentation

## 2015-12-12 DIAGNOSIS — I10 Essential (primary) hypertension: Secondary | ICD-10-CM | POA: Insufficient documentation

## 2015-12-12 DIAGNOSIS — Z923 Personal history of irradiation: Secondary | ICD-10-CM

## 2015-12-12 DIAGNOSIS — E039 Hypothyroidism, unspecified: Secondary | ICD-10-CM | POA: Insufficient documentation

## 2015-12-12 DIAGNOSIS — I739 Peripheral vascular disease, unspecified: Secondary | ICD-10-CM | POA: Insufficient documentation

## 2015-12-12 DIAGNOSIS — Z79899 Other long term (current) drug therapy: Secondary | ICD-10-CM | POA: Insufficient documentation

## 2015-12-12 DIAGNOSIS — F17210 Nicotine dependence, cigarettes, uncomplicated: Secondary | ICD-10-CM | POA: Diagnosis not present

## 2015-12-12 DIAGNOSIS — Z931 Gastrostomy status: Secondary | ICD-10-CM

## 2015-12-12 DIAGNOSIS — Z9221 Personal history of antineoplastic chemotherapy: Secondary | ICD-10-CM | POA: Diagnosis not present

## 2015-12-12 DIAGNOSIS — E876 Hypokalemia: Secondary | ICD-10-CM | POA: Insufficient documentation

## 2015-12-12 DIAGNOSIS — E785 Hyperlipidemia, unspecified: Secondary | ICD-10-CM | POA: Insufficient documentation

## 2015-12-12 LAB — CBC WITH DIFFERENTIAL/PLATELET
Basophils Absolute: 0 10*3/uL (ref 0–0.1)
Basophils Relative: 0 %
Eosinophils Absolute: 0 10*3/uL (ref 0–0.7)
Eosinophils Relative: 0 %
HEMATOCRIT: 27.7 % — AB (ref 35.0–47.0)
HEMOGLOBIN: 9.8 g/dL — AB (ref 12.0–16.0)
LYMPHS ABS: 0.4 10*3/uL — AB (ref 1.0–3.6)
Lymphocytes Relative: 5 %
MCH: 37.9 pg — AB (ref 26.0–34.0)
MCHC: 35.4 g/dL (ref 32.0–36.0)
MCV: 107.1 fL — AB (ref 80.0–100.0)
MONO ABS: 0.6 10*3/uL (ref 0.2–0.9)
MONOS PCT: 7 %
NEUTROS ABS: 8.2 10*3/uL — AB (ref 1.4–6.5)
NEUTROS PCT: 88 %
Platelets: 301 10*3/uL (ref 150–440)
RBC: 2.59 MIL/uL — ABNORMAL LOW (ref 3.80–5.20)
RDW: 16 % — ABNORMAL HIGH (ref 11.5–14.5)
WBC: 9.3 10*3/uL (ref 3.6–11.0)

## 2015-12-12 LAB — COMPREHENSIVE METABOLIC PANEL
ALK PHOS: 60 U/L (ref 38–126)
ALT: 14 U/L (ref 14–54)
ANION GAP: 8 (ref 5–15)
AST: 20 U/L (ref 15–41)
Albumin: 3.8 g/dL (ref 3.5–5.0)
BILIRUBIN TOTAL: 0.7 mg/dL (ref 0.3–1.2)
BUN: 15 mg/dL (ref 6–20)
CALCIUM: 8.9 mg/dL (ref 8.9–10.3)
CO2: 25 mmol/L (ref 22–32)
Chloride: 93 mmol/L — ABNORMAL LOW (ref 101–111)
Creatinine, Ser: 0.58 mg/dL (ref 0.44–1.00)
GLUCOSE: 144 mg/dL — AB (ref 65–99)
Potassium: 3.8 mmol/L (ref 3.5–5.1)
Sodium: 126 mmol/L — ABNORMAL LOW (ref 135–145)
TOTAL PROTEIN: 6.9 g/dL (ref 6.5–8.1)

## 2015-12-12 LAB — MAGNESIUM: MAGNESIUM: 1.9 mg/dL (ref 1.7–2.4)

## 2015-12-12 MED ORDER — HEPARIN SOD (PORK) LOCK FLUSH 100 UNIT/ML IV SOLN
INTRAVENOUS | Status: AC
  Filled 2015-12-12: qty 5

## 2015-12-12 MED ORDER — SODIUM CHLORIDE 0.9% FLUSH
10.0000 mL | Freq: Once | INTRAVENOUS | Status: AC
  Administered 2015-12-12: 10 mL via INTRAVENOUS
  Filled 2015-12-12: qty 10

## 2015-12-12 MED ORDER — HEPARIN SOD (PORK) LOCK FLUSH 100 UNIT/ML IV SOLN
500.0000 [IU] | Freq: Once | INTRAVENOUS | Status: AC
  Administered 2015-12-12: 500 [IU] via INTRAVENOUS

## 2015-12-12 NOTE — Progress Notes (Signed)
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 Her last radiation was on August 11.  In the interim she has met with ENT- patient declines any surgery. She is a to discuss other options  Patient continues to feel poorly. She continues to notice left mandibular mass- not any smaller. Complains of significant fatigue. Most of the nutrition is through PEG tube. Also complains of fatigue.no tingling or numbness no  Tinnitus.  ROS: A complete 10 point review of system is done which is negative except mentioned above in history of present illness  MEDICAL HISTORY:  Past Medical History:  Diagnosis Date  . Dysphagia   . G tube feedings ([place])   . Hyperlipidemia   . Hypertension   . Hypokalemia   . Hypothyroidism   . Malnourished ([place])   . Metastatic cancer ([place])   . Oral-mouth cancer ([place])    tongue  . Peripheral vascular disease ([place])   .  Thyroid disease     SURGICAL HISTORY: None  SOCIAL HISTORY: Patient previously used to work in nursing homes. Denies any alcohol. Smokes a pack of Celexa day for 45 years lives with her husband in [place]. No children.  Social History   Social History  . Marital status: Married    Spouse name: N/A  . Number of children: N/A  . Years of education: N/A   Occupational History  . Not on file.   Social History Main Topics  . Smoking status: Former Smoker    Packs/day: 1.00    Years: 45.00    Types: Cigarettes    Quit date: 06/19/2015  . Smokeless tobacco: Never Used  . Alcohol use No  . Drug use: No  . Sexual activity: Not on file   Other Topics Concern  . Not on file   Social History Narrative  . No narrative on file    FAMILY HISTORY:no family history of head and neck cancer.   ALLERGIES:  is allergic to garlic; lactose intolerance (gi); and tetracyclines & related.  MEDICATIONS:  Current Outpatient Prescriptions  Medication Sig Dispense Refill  . atenolol (TENORMIN) 25 MG tablet Take 25 mg by mouth daily.     Marland Kitchen docusate sodium (COLACE) 100 MG capsule Take 100 mg by mouth 2 (two) times daily.    . Gauze Pads & Dressings (DERMACEA DRAIN SPONGES) 4"X4" PADS 1  each by Does not apply route 1 day or 1 dose. 50 each 0  . Irrigation Supplies (PISTON IRRIGATION SYRINGE) MISC 1 Syringe by Does not apply route 1 day or 1 dose. 30 each 0  . levothyroxine (SYNTHROID, LEVOTHROID) 50 MCG tablet     . lidocaine-prilocaine (EMLA) cream Apply 1 application topically as needed. 30 g 6  . lovastatin (MEVACOR) 40 MG tablet Take 40 mg by mouth every evening.     . magic mouthwash w/lidocaine SOLN Take 5 mLs by mouth 4 (four) times daily. 80 ml viscous lidocaine 2%, 80 ml Mylanta, 80 ml Diphenhydramine 12.5 mg/5 ml Elixir, 80 ml Nystatin 100,000 Unit suspension, 80 ml Prednisolone 15 mg/25m, 80 ml Distilled Water. Sig: Swish/Swallow 5-10 ml four times a day as needed. Dispense 480 ml. 3RFs 480  mL 3  . Nutritional Supplements (FEEDING SUPPLEMENT, JEVITY 1.5 CAL,) LIQD Place 1,000 mLs into feeding tube continuous. 4 to 5 cans via tube feed    . polyethylene glycol (MIRALAX / GLYCOLAX) packet Take 17 g by mouth daily.    . Water For Irrigation, Sterile (STERILE WATER FOR IRRIGATION) Irrigate with 1,000 mLs as directed once. 1000 mL 0  . ondansetron (ZOFRAN) 8 MG tablet TAKE 1 TABLET EVERY 8 HOURS AS NEEDED FOR NAUSEA AND VOMITING (START 3 DAYS AFTER CHEMOTHERAPY) (Patient not taking: Reported on 12/12/2015) 40 tablet 0  . prochlorperazine (COMPAZINE) 10 MG tablet TAKE 1 TABLET EVERY 6 HOURS AS NEEDED FOR NAUSEA OR VOMITING. (Patient not taking: Reported on 12/12/2015) 30 tablet 0   No current facility-administered medications for this visit.       .Marland Kitchen PHYSICAL EXAMINATION: ECOG PERFORMANCE STATUS: 1 - Symptomatic but completely ambulatory  Vitals:   12/12/15 0930  BP: 121/80  Pulse: 100  Resp: 16  Temp: (!) 96.9 F (36.1 C)   Filed Weights   12/12/15 0930  Weight: 74 lb 8 oz (33.8 kg)    GENERAL: Thin built moderately nourished Caucasian female patient anxious. Alert, no distress and comfortable. She is alone.   EYES: no pallor or icterus OROPHARYNX: no thrush or ulceration; poor dentition. No obvious masses noted in the oral cavity.Thick secretions noted NECK: supple, 5-6cm mass[soft/cystic]- submandibular mass. Non-tender.  LYMPH:  no palpable lymphadenopathy xillary or inguinal regions LUNGS: Decreased breath sounds bilaterally No wheeze or crackles; barrel chested.  HEART/CVS: regular rate & rhythm and no murmurs; No lower extremity edema ABDOMEN: abdomen soft, non-tender and normal bowel sounds Musculoskeletal:no cyanosis of digits and no clubbing  PSYCH: alert & oriented x 3 with fluent speech NEURO: no focal motor/sensory deficits SKIN:  no rashes or significant lesions  LABORATORY DATA:  I have reviewed the data as listed Lab Results  Component Value Date    WBC 9.3 12/12/2015   HGB 9.8 (L) 12/12/2015   HCT 27.7 (L) 12/12/2015   MCV 107.1 (H) 12/12/2015   PLT 301 12/12/2015    Recent Labs  10/23/15 1343 11/14/15 0928 11/22/15 2310 12/12/15 0916  NA 129* 130* 129* 126*  K 4.7 4.5 3.9 3.8  CL 98* 96* 93* 93*  CO2 _0 GLUCOSE 137* 121* 94 144*  BUN _1 CREATININE 0.63 0.57 0.55 0.58  CALCIUM 8.6* 8.8* 9.2 8.9  GFRNONAA >60 >60 >60 >60  GFRAA >60 >60 >60 >60  PROT 6.5 7.4  --  6.9  ALBUMIN 3.8 4.1  --  3.8  AST 17 18  --  20  ALT 14  12*  --  14  ALKPHOS 79 73  --  60  BILITOT 0.4 0.3  --  0.7   IMPRESSION: 1. Intense metabolic activity associated with the superior medial aspect of the cystic solid lesion in the floor of the mouth. Metabolic activity is decreased from comparison exam but the size of the cystic portion is increased. The metabolic activity remains intense with SUV max equal 8.7. 2. Persistent intense metabolic activity in the medial anterior floor of the mouth unchanged from prior. 3. Persistent hypermetabolic activity along the body LEFT mandible is slightly decreased. 4. Increase in size and metabolic activity of a high level II lymph node on the LEFT. 5. New hypermetabolic level I lymph node on the RIGHT anterior to the submandibular gland. 6. No evidence of metastatic disease below the neck.   Electronically Signed   By: Suzy Bouchard M.D.   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 ? resection Currently s/p chemoradiation status post 6 cycles of chemotherapy. No significant clinical improvement noted. finished RT on 8/11 s/p Cisplatin weekly. September 21 PET scan- no improvement of the left-sided floor of mouth/buccal lesion; shows right-sided new submandibular Adenopathy- progressive disease. Discussed with Dr. Tami Ribas. Pt declines surgical  options; also declines chemotherapy.   #  Discussed re: immunotherapy;  I discussed the mechanism of action; The goal of therapy is palliative; and length of treatments are likely ongoing/based upon the results of the scans. Discussed the potential side effects of immunotherapy including but not limited to diarrhea; skin rash; elevated LFTs/endocrine abnormalities etc. discussed Opdivo  # For nutrition weight loss-  Status post PEG tube placement. On tube feeds; also on PO intake Stable.   # follow up with 2 weeks [pt preference]; she does not want to start treatment until she discusses with her husband. She'll come back in approximately 2 weeks to review the treatment options again; and then start immunotherapy.   # 25 minutes face-to-face with the patient discussing the above plan of care; more than 50% of time spent on prognosis/ natural history; counseling and coordination.   Cammie Sickle, MD   12/12/2015 4:38 PM  .

## 2015-12-12 NOTE — Assessment & Plan Note (Addendum)
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 ? resection Currently s/p chemoradiation status post 6 cycles of chemotherapy. No significant clinical improvement noted. finished RT on 8/11 s/p Cisplatin weekly. September 21 PET scan- no improvement of the left-sided floor of mouth/buccal lesion; shows right-sided new submandibular Adenopathy- progressive disease. Discussed with Dr. Tami Ribas. Pt declines surgical options; also declines chemotherapy.   #  Discussed re: immunotherapy;  I discussed the mechanism of action; The goal of therapy is palliative; and length of treatments are likely ongoing/based upon the results of the scans. Discussed the potential side effects of immunotherapy including but not limited to diarrhea; skin rash; elevated LFTs/endocrine abnormalities etc. discussed Opdivo  # For nutrition weight loss-  Status post PEG tube placement. On tube feeds; also on PO intake Stable.   # follow up with 2 weeks [pt preference]; she does not want to start treatment until she discusses with her husband. She'll come back in approximately 2 weeks to review the treatment options again; and then start immunotherapy.

## 2015-12-12 NOTE — Progress Notes (Signed)
START ON PATHWAY REGIMEN - Head and Neck  [id number]: Nivolumab 3 mg/kg q 14 Days Until Progression or Unacceptable Toxicity   A cycle is every 14 days:     Nivolumab (Opdivo(R)) 3 mg/kg in 100 mL NS IV over 60 minutes every 2 weeks until disease progression or unacceptable toxicity. Inline filter required (low protein binding). Dose Mod: None Additional Orders: Severe immune-mediated reactions can occur (e.g. pneumonitis, colitis, and hepatitis).  See prescribing information for more details including monitoring and required immediate management with steroids. Monitor thyroid, renal, liver  function tests, glucose, and sodium at baseline and periodically during therapy.  **Always confirm dose/schedule in your pharmacy ordering system**    Patient Characteristics: Oral Cavity, Metastatic (Squamous Cell), Second Line Disease Classification: Oral Cavity AJCC M Stage: X AJCC N Stage: X AJCC T Stage: X Current Disease Status: Distant Metastases AJCC Stage Grouping: IVB Line of therapy: Second Line Would you be surprised if this patient died  in the next year? I would be surprised if this patient died in the next year  Intent of Therapy: Non-Curative / Palliative Intent, Discussed with Patient

## 2015-12-24 ENCOUNTER — Telehealth: Payer: Self-pay | Admitting: Internal Medicine

## 2015-12-24 ENCOUNTER — Telehealth: Payer: Self-pay

## 2015-12-24 ENCOUNTER — Other Ambulatory Visit: Payer: Self-pay | Admitting: *Deleted

## 2015-12-24 DIAGNOSIS — B370 Candidal stomatitis: Secondary | ICD-10-CM

## 2015-12-24 DIAGNOSIS — B3781 Candidal esophagitis: Principal | ICD-10-CM

## 2015-12-24 MED ORDER — NYSTATIN 100000 UNIT/ML MT SUSP: 5.0000 mL | Freq: Four times a day (QID) | OROMUCOSAL | 0 refills | Status: DC

## 2015-12-24 NOTE — Telephone Encounter (Signed)
Pt has thrush in her mouth again and needs Rx. Please call her anytime today at home.

## 2015-12-24 NOTE — Telephone Encounter (Signed)
Called in nystatin mouth wash. Patient notified.

## 2015-12-24 NOTE — Telephone Encounter (Signed)
Nutrition Follow-up:  Called to speak with Ms. Maltz regarding tube feeding regimen.  Previously seen on 9/13 and increased tube feeding from jevity 1.2 (taking 3 cans) to jevity 1.5 4-5 cans per day.    Pt reports that she has been taking jevity 1.5 4 cans per day without difficulty.  Reports that is not taking any solid food orally but still drinking liquids. Reports mouth is sore at this time.  Reports tube is working well and flushing tube with feeding but not providing any other water via tube at this time.   Labs: Na 126 (9/28), glucose 144  Medications reviewed:   Gastrointestinal profile: Pt reports BM every other day usually.    Wt: Pt reports wt is between 74 and 78 lbs.  Wt in office on 9/13 80 lb.    Nutrition Diagnosis: Unintentional wt loss related to cancer and cancer related treatments as evidenced by continued wt loss, ongoing  Goal: Pt to meet at least 90% of estimated nutritional needs, meeting with 4 cans of jevity 1.5   Monitor: tube feeding tolerance and wt trends  Intervention: -Continue to encourage 4-5 cans of jevity 1.5 via PEG for added nutrition.  If pt unable to continue water via mouth will need to provide additional free water via PEG tube for hydration.    Next visit:  Phone call in 1 month. Pt to call RD if needed before then.   Kevionna Heffler B. Zenia Resides, [place], [place] (pager) Weekend/On-Call pager [phone])

## 2015-12-26 ENCOUNTER — Encounter: Payer: Self-pay | Admitting: Internal Medicine

## 2015-12-26 ENCOUNTER — Inpatient Hospital Stay (HOSPITAL_BASED_OUTPATIENT_CLINIC_OR_DEPARTMENT_OTHER): Payer: Commercial Managed Care - HMO | Admitting: Internal Medicine

## 2015-12-26 ENCOUNTER — Inpatient Hospital Stay: Payer: Commercial Managed Care - HMO | Attending: Internal Medicine

## 2015-12-26 ENCOUNTER — Inpatient Hospital Stay: Payer: Commercial Managed Care - HMO

## 2015-12-26 VITALS — BP 137/87 | HR 81 | Temp 95.4°F | Resp 18 | Ht 60.0 in | Wt 73.2 lb

## 2015-12-26 DIAGNOSIS — Z923 Personal history of irradiation: Secondary | ICD-10-CM | POA: Diagnosis not present

## 2015-12-26 DIAGNOSIS — E039 Hypothyroidism, unspecified: Secondary | ICD-10-CM | POA: Insufficient documentation

## 2015-12-26 DIAGNOSIS — F17210 Nicotine dependence, cigarettes, uncomplicated: Secondary | ICD-10-CM | POA: Diagnosis not present

## 2015-12-26 DIAGNOSIS — I739 Peripheral vascular disease, unspecified: Secondary | ICD-10-CM | POA: Diagnosis not present

## 2015-12-26 DIAGNOSIS — Z79899 Other long term (current) drug therapy: Secondary | ICD-10-CM

## 2015-12-26 DIAGNOSIS — C049 Malignant neoplasm of floor of mouth, unspecified: Secondary | ICD-10-CM

## 2015-12-26 DIAGNOSIS — Z5111 Encounter for antineoplastic chemotherapy: Secondary | ICD-10-CM | POA: Diagnosis not present

## 2015-12-26 DIAGNOSIS — Z931 Gastrostomy status: Secondary | ICD-10-CM | POA: Diagnosis not present

## 2015-12-26 DIAGNOSIS — E876 Hypokalemia: Secondary | ICD-10-CM | POA: Insufficient documentation

## 2015-12-26 DIAGNOSIS — E871 Hypo-osmolality and hyponatremia: Secondary | ICD-10-CM | POA: Diagnosis not present

## 2015-12-26 DIAGNOSIS — I10 Essential (primary) hypertension: Secondary | ICD-10-CM | POA: Diagnosis not present

## 2015-12-26 DIAGNOSIS — E785 Hyperlipidemia, unspecified: Secondary | ICD-10-CM | POA: Insufficient documentation

## 2015-12-26 LAB — COMPREHENSIVE METABOLIC PANEL
ALT: 14 U/L (ref 14–54)
ANION GAP: 10 (ref 5–15)
AST: 18 U/L (ref 15–41)
Albumin: 3.7 g/dL (ref 3.5–5.0)
Alkaline Phosphatase: 56 U/L (ref 38–126)
BILIRUBIN TOTAL: 0.7 mg/dL (ref 0.3–1.2)
BUN: 22 mg/dL — ABNORMAL HIGH (ref 6–20)
CHLORIDE: 89 mmol/L — AB (ref 101–111)
CO2: 26 mmol/L (ref 22–32)
Calcium: 8.8 mg/dL — ABNORMAL LOW (ref 8.9–10.3)
Creatinine, Ser: 0.56 mg/dL (ref 0.44–1.00)
GFR calc Af Amer: >60 mL/min (ref >60)
Glucose, Bld: 102 mg/dL — ABNORMAL HIGH (ref 65–99)
POTASSIUM: 3.5 mmol/L (ref 3.5–5.1)
Sodium: 125 mmol/L — ABNORMAL LOW (ref 135–145)
TOTAL PROTEIN: 6.7 g/dL (ref 6.5–8.1)

## 2015-12-26 LAB — CBC WITH DIFFERENTIAL/PLATELET
BASOS ABS: 0 10*3/uL (ref 0–0.1)
BASOS PCT: 1 %
EOS PCT: 0 %
Eosinophils Absolute: 0 10*3/uL (ref 0–0.7)
HEMATOCRIT: 26.4 % — AB (ref 35.0–47.0)
Hemoglobin: 9.4 g/dL — ABNORMAL LOW (ref 12.0–16.0)
LYMPHS PCT: 5 %
Lymphs Abs: 0.4 10*3/uL — ABNORMAL LOW (ref 1.0–3.6)
MCH: 37.3 pg — ABNORMAL HIGH (ref 26.0–34.0)
MCHC: 35.8 g/dL (ref 32.0–36.0)
MCV: 104.2 fL — AB (ref 80.0–100.0)
MONO ABS: 0.5 10*3/uL (ref 0.2–0.9)
MONOS PCT: 7 %
NEUTROS ABS: 6.9 10*3/uL — AB (ref 1.4–6.5)
Neutrophils Relative %: 87 %
PLATELETS: 240 10*3/uL (ref 150–440)
RBC: 2.53 MIL/uL — ABNORMAL LOW (ref 3.80–5.20)
RDW: 15.5 % — AB (ref 11.5–14.5)
WBC: 7.9 10*3/uL (ref 3.6–11.0)

## 2015-12-26 LAB — SAMPLE TO BLOOD BANK

## 2015-12-26 LAB — MAGNESIUM: MAGNESIUM: 2 mg/dL (ref 1.7–2.4)

## 2015-12-26 LAB — TSH: TSH: 4.038 u[IU]/mL (ref 0.350–4.500)

## 2015-12-26 MED ORDER — SODIUM CHLORIDE 0.9 % IJ SOLN
10.0000 mL | Freq: Once | INTRAMUSCULAR | Status: AC
  Administered 2015-12-26: 10 mL via INTRAVENOUS
  Filled 2015-12-26: qty 10

## 2015-12-26 MED ORDER — SODIUM CHLORIDE 0.9 % IV SOLN
3.0000 mg/kg | Freq: Once | INTRAVENOUS | Status: AC
  Administered 2015-12-26: 100 mg via INTRAVENOUS
  Filled 2015-12-26: qty 10

## 2015-12-26 MED ORDER — HEPARIN SOD (PORK) LOCK FLUSH 100 UNIT/ML IV SOLN
500.0000 [IU] | Freq: Once | INTRAVENOUS | Status: AC
  Administered 2015-12-26: 500 [IU] via INTRAVENOUS

## 2015-12-26 MED ORDER — SODIUM CHLORIDE 0.9 % IV SOLN
Freq: Once | INTRAVENOUS | Status: AC
  Administered 2015-12-26: 11:00:00 via INTRAVENOUS
  Filled 2015-12-26: qty 1000

## 2015-12-26 NOTE — Progress Notes (Signed)
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 Patient has declined surgery- is here to initiate Opdivo treatment she is accompanied by her husband.  Denies any significant pain. She continues to notice left mandibular mass- not any smaller. Complains of significant fatigue. Most of the nutrition is through PEG tube. Also complains of fatigue.no tingling or numbness no  Tinnitus.  ROS: A complete 10 point review of system is done which is negative except mentioned above in history of present illness  MEDICAL HISTORY:  Past Medical History:  Diagnosis Date  . Dysphagia   . G tube feedings ([place])   . Hyperlipidemia   . Hypertension   . Hypokalemia   . Hypothyroidism   . Malnourished ([place])   . Metastatic cancer ([place])   . 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 Peripheral vascular disease ([place])   . Thyroid disease      SURGICAL HISTORY: None  SOCIAL HISTORY: Patient previously used to work in nursing homes. Denies any alcohol. Smokes a pack of Celexa day for 45 years lives with her husband in [place]. No children.  Social History   Social History  . Marital status: Married    Spouse name: N/A  . Number of children: N/A  . Years of education: N/A   Occupational History  . Not on file.   Social History Main Topics  . Smoking status: Former Smoker    Packs/day: 1.00    Years: 45.00    Types: Cigarettes    Quit date: 06/19/2015  . Smokeless tobacco: Never Used  . Alcohol use No  . Drug use: No  . Sexual activity: Not on file   Other Topics Concern  . Not on file   Social History Narrative  . No narrative on file    FAMILY HISTORY:no family history of head and neck cancer.   ALLERGIES:  is allergic to garlic; lactose intolerance (gi); and tetracyclines & related.  MEDICATIONS:  Current Outpatient Prescriptions  Medication Sig Dispense Refill  . atenolol (TENORMIN) 25 MG tablet Take 25 mg by mouth daily.     Marland Kitchen docusate sodium (COLACE) 100 MG capsule Take 100 mg by mouth 2 (two) times daily.    . Gauze Pads & Dressings (DERMACEA DRAIN SPONGES) 4"X4" PADS 1 each by Does not apply route 1 day or 1 dose.  50 each 0  . Irrigation Supplies (PISTON IRRIGATION SYRINGE) MISC 1 Syringe by Does not apply route 1 day or 1 dose. 30 each 0  . levothyroxine (SYNTHROID, LEVOTHROID) 50 MCG tablet     . lidocaine-prilocaine (EMLA) cream Apply 1 application topically as needed. 30 g 6  . lovastatin (MEVACOR) 40 MG tablet Take 40 mg by mouth every evening.     . magic mouthwash w/lidocaine SOLN Take 5 mLs by mouth 4 (four) times daily. 80 ml viscous lidocaine 2%, 80 ml Mylanta, 80 ml Diphenhydramine 12.5 mg/5 ml Elixir, 80 ml Nystatin 100,000 Unit suspension, 80 ml Prednisolone 15 mg/30ml, 80 ml Distilled Water. Sig: Swish/Swallow 5-10 ml four times a day as needed. Dispense 480 ml. 3RFs 480 mL 3  .  Nutritional Supplements (FEEDING SUPPLEMENT, JEVITY 1.5 CAL,) LIQD Place 1,000 mLs into feeding tube continuous. 4 to 5 cans via tube feed    . nystatin (MYCOSTATIN) 100000 UNIT/ML suspension Take 5 mLs (500,000 Units total) by mouth 4 (four) times daily. 60 mL 0  . polyethylene glycol (MIRALAX / GLYCOLAX) packet Take 17 g by mouth daily.    . Water For Irrigation, Sterile (STERILE WATER FOR IRRIGATION) Irrigate with 1,000 mLs as directed once. 1000 mL 0  . ondansetron (ZOFRAN) 8 MG tablet TAKE 1 TABLET EVERY 8 HOURS AS NEEDED FOR NAUSEA AND VOMITING (START 3 DAYS AFTER CHEMOTHERAPY) (Patient not taking: Reported on 12/26/2015) 40 tablet 0  . prochlorperazine (COMPAZINE) 10 MG tablet TAKE 1 TABLET EVERY 6 HOURS AS NEEDED FOR NAUSEA OR VOMITING. (Patient not taking: Reported on 12/26/2015) 30 tablet 0   No current facility-administered medications for this visit.       Marland Kitchen  PHYSICAL EXAMINATION: ECOG PERFORMANCE STATUS: 1 - Symptomatic but completely ambulatory  Vitals:   12/26/15 1016  BP: 137/87  Pulse: 81  Resp: 18  Temp: (!) 95.4 F (35.2 C)   Filed Weights   12/26/15 1016  Weight: 73 lb 3.2 oz (33.2 kg)    GENERAL: Thin built moderately nourished Caucasian female patient anxious. Alert, no distress and comfortable. Accompanied by her husband.  EYES: no pallor or icterus OROPHARYNX: no thrush or ulceration; poor dentition. No obvious masses noted in the oral cavity.Thick secretions noted NECK: supple, 5-6cm mass[soft/cystic]- submandibular mass. Non-tender.  LYMPH:  no palpable lymphadenopathy xillary or inguinal regions LUNGS: Decreased breath sounds bilaterally No wheeze or crackles; barrel chested.  HEART/CVS: regular rate & rhythm and no murmurs; No lower extremity edema ABDOMEN: abdomen soft, non-tender and normal bowel sounds Musculoskeletal:no cyanosis of digits and no clubbing  PSYCH: alert & oriented x 3 with fluent speech NEURO: no focal motor/sensory deficits SKIN:   no rashes or significant lesions  LABORATORY DATA:  I have reviewed the data as listed Lab Results  Component Value Date   WBC 7.9 12/26/2015   HGB 9.4 (L) 12/26/2015   HCT 26.4 (L) 12/26/2015   MCV 104.2 (H) 12/26/2015   PLT 240 12/26/2015    Recent Labs  11/14/15 0928 11/22/15 2310 12/12/15 0916 12/26/15 0946  NA 130* 129* 126* 125*  K 4.5 3.9 3.8 3.5  CL 96* 93* 93* 89*  CO2 24 24 25 26   GLUCOSE 121* 94 144* 102*  BUN 16 20 15  22*  CREATININE 0.57 0.55 0.58 0.56  CALCIUM 8.8* 9.2 8.9 8.8*  GFRNONAA >60 >60 >60 >60  GFRAA >60 >60 >60 >60  PROT 7.4  --  6.9 6.7  ALBUMIN 4.1  --  3.8 3.7  AST 18  --  20 18  ALT 12*  --  14 14  ALKPHOS 73  --  60 56  BILITOT 0.3  --  0.7 0.7   IMPRESSION: 1. Intense metabolic activity associated with the superior medial aspect of the cystic solid lesion in the floor of the mouth. Metabolic activity is decreased from comparison exam but the size of the cystic portion is increased. The metabolic activity remains intense with SUV max equal 8.7. 2. Persistent intense metabolic activity in the medial anterior floor of the mouth unchanged from prior. 3. Persistent hypermetabolic activity along the body LEFT mandible is slightly decreased. 4. Increase in size and metabolic activity of a high level II lymph node on the LEFT. 5. New hypermetabolic level I lymph node on the RIGHT anterior to the submandibular gland. 6. No evidence of metastatic disease below the neck.   Electronically Signed   By: Suzy Bouchard M.D.   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  Progressive disease on PET;  September 21 PET scan- no improvement of the left-sided floor of mouth/buccal lesion; shows right-sided new submandibular Adenopathy- progressive disease.   #  Discussed re: immunotherapy;  I discussed the  mechanism of action; The goal of therapy is palliative; and length of treatments are likely ongoing/based upon the results of the scans. Discussed the potential side effects of immunotherapy including but not limited to diarrhea; skin rash; elevated LFTs/endocrine abnormalities etc. discussed Opdivo every 2 weeks.   # Hyponatremia- 125. Increase salt intake/ fluid intake.   # For nutrition weight loss-  Status post PEG tube placement. On tube feeds; also on PO intake.   # follow up with 2 weeks/MD/Labs.   # 25 minutes face-to-face with the patient discussing the above plan of care; more than 50% of time spent on prognosis/ natural history; counseling and coordination.   Cammie Sickle, MD   12/29/2015 11:58 AM  .

## 2015-12-26 NOTE — Progress Notes (Signed)
Pt reports able to swallow liquids, solids cannot.  Uses feeding tube.

## 2015-12-26 NOTE — Assessment & Plan Note (Signed)
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  Progressive disease on PET;  September 21 PET scan- no improvement of the left-sided floor of mouth/buccal lesion; shows right-sided new submandibular Adenopathy- progressive disease.   #  Discussed re: immunotherapy;  I discussed the mechanism of action; The goal of therapy is palliative; and length of treatments are likely ongoing/based upon the results of the scans. Discussed the potential side effects of immunotherapy including but not limited to diarrhea; skin rash; elevated LFTs/endocrine abnormalities etc. discussed Opdivo every 2 weeks.   # Hyponatremia- 125. Increase salt intake/ fluid intake.   # For nutrition weight loss-  Status post PEG tube placement. On tube feeds; also on PO intake.   # follow up with 2 weeks/MD/Labs.

## 2016-01-09 ENCOUNTER — Inpatient Hospital Stay: Payer: Commercial Managed Care - HMO

## 2016-01-09 ENCOUNTER — Inpatient Hospital Stay (HOSPITAL_BASED_OUTPATIENT_CLINIC_OR_DEPARTMENT_OTHER): Payer: Commercial Managed Care - HMO | Admitting: Internal Medicine

## 2016-01-09 ENCOUNTER — Other Ambulatory Visit: Payer: Self-pay

## 2016-01-09 ENCOUNTER — Inpatient Hospital Stay (HOSPITAL_BASED_OUTPATIENT_CLINIC_OR_DEPARTMENT_OTHER): Payer: Commercial Managed Care - HMO

## 2016-01-09 VITALS — BP 147/90 | HR 102 | Temp 95.5°F | Resp 18 | Ht 60.0 in | Wt 71.8 lb

## 2016-01-09 DIAGNOSIS — E039 Hypothyroidism, unspecified: Secondary | ICD-10-CM | POA: Diagnosis not present

## 2016-01-09 DIAGNOSIS — I10 Essential (primary) hypertension: Secondary | ICD-10-CM | POA: Diagnosis not present

## 2016-01-09 DIAGNOSIS — Z79899 Other long term (current) drug therapy: Secondary | ICD-10-CM

## 2016-01-09 DIAGNOSIS — Z923 Personal history of irradiation: Secondary | ICD-10-CM

## 2016-01-09 DIAGNOSIS — E871 Hypo-osmolality and hyponatremia: Secondary | ICD-10-CM

## 2016-01-09 DIAGNOSIS — Z931 Gastrostomy status: Secondary | ICD-10-CM | POA: Diagnosis not present

## 2016-01-09 DIAGNOSIS — F17210 Nicotine dependence, cigarettes, uncomplicated: Secondary | ICD-10-CM

## 2016-01-09 DIAGNOSIS — C049 Malignant neoplasm of floor of mouth, unspecified: Secondary | ICD-10-CM

## 2016-01-09 DIAGNOSIS — Z5111 Encounter for antineoplastic chemotherapy: Secondary | ICD-10-CM | POA: Diagnosis not present

## 2016-01-09 LAB — CBC WITH DIFFERENTIAL/PLATELET
BASOS ABS: 0 10*3/uL (ref 0–0.1)
Basophils Relative: 0 %
EOS PCT: 0 %
Eosinophils Absolute: 0 10*3/uL (ref 0–0.7)
HCT: 27.3 % — ABNORMAL LOW (ref 35.0–47.0)
Hemoglobin: 9.8 g/dL — ABNORMAL LOW (ref 12.0–16.0)
LYMPHS PCT: 5 %
Lymphs Abs: 0.4 10*3/uL — ABNORMAL LOW (ref 1.0–3.6)
MCH: 36.7 pg — ABNORMAL HIGH (ref 26.0–34.0)
MCHC: 35.8 g/dL (ref 32.0–36.0)
MCV: 102.3 fL — AB (ref 80.0–100.0)
Monocytes Absolute: 0.6 10*3/uL (ref 0.2–0.9)
Monocytes Relative: 8 %
NEUTROS ABS: 6.4 10*3/uL (ref 1.4–6.5)
Neutrophils Relative %: 87 %
PLATELETS: 209 10*3/uL (ref 150–440)
RBC: 2.67 MIL/uL — AB (ref 3.80–5.20)
RDW: 15.1 % — ABNORMAL HIGH (ref 11.5–14.5)
WBC: 7.4 10*3/uL (ref 3.6–11.0)

## 2016-01-09 LAB — COMPREHENSIVE METABOLIC PANEL
ALT: 14 U/L (ref 14–54)
ANION GAP: 8 (ref 5–15)
AST: 22 U/L (ref 15–41)
Albumin: 3.5 g/dL (ref 3.5–5.0)
Alkaline Phosphatase: 62 U/L (ref 38–126)
BILIRUBIN TOTAL: 0.5 mg/dL (ref 0.3–1.2)
BUN: 17 mg/dL (ref 6–20)
CO2: 26 mmol/L (ref 22–32)
Calcium: 8.7 mg/dL — ABNORMAL LOW (ref 8.9–10.3)
Chloride: 90 mmol/L — ABNORMAL LOW (ref 101–111)
Creatinine, Ser: 0.73 mg/dL (ref 0.44–1.00)
GFR calc Af Amer: >60 mL/min (ref >60)
Glucose, Bld: 122 mg/dL — ABNORMAL HIGH (ref 65–99)
POTASSIUM: 3.9 mmol/L (ref 3.5–5.1)
Sodium: 124 mmol/L — ABNORMAL LOW (ref 135–145)
TOTAL PROTEIN: 6.4 g/dL — AB (ref 6.5–8.1)

## 2016-01-09 LAB — SAMPLE TO BLOOD BANK

## 2016-01-09 LAB — TSH: TSH: 6.453 u[IU]/mL — ABNORMAL HIGH (ref 0.350–4.500)

## 2016-01-09 LAB — MAGNESIUM: MAGNESIUM: 1.9 mg/dL (ref 1.7–2.4)

## 2016-01-09 MED ORDER — SODIUM CHLORIDE 0.9 % IJ SOLN
10.0000 mL | Freq: Once | INTRAMUSCULAR | Status: AC
  Administered 2016-01-09: 10 mL via INTRAVENOUS
  Filled 2016-01-09: qty 10

## 2016-01-09 MED ORDER — HEPARIN SOD (PORK) LOCK FLUSH 100 UNIT/ML IV SOLN
500.0000 [IU] | Freq: Once | INTRAVENOUS | Status: AC | PRN
  Administered 2016-01-09: 500 [IU]
  Filled 2016-01-09: qty 5

## 2016-01-09 MED ORDER — SODIUM CHLORIDE 0.9% FLUSH
10.0000 mL | INTRAVENOUS | Status: DC | PRN
  Filled 2016-01-09: qty 10

## 2016-01-09 MED ORDER — NIVOLUMAB CHEMO INJECTION 100 MG/10ML
3.0000 mg/kg | Freq: Once | INTRAVENOUS | Status: AC
  Administered 2016-01-09: 100 mg via INTRAVENOUS
  Filled 2016-01-09: qty 10

## 2016-01-09 MED ORDER — SODIUM CHLORIDE 0.9 % IV SOLN
Freq: Once | INTRAVENOUS | Status: AC
  Administered 2016-01-09: 10:00:00 via INTRAVENOUS
  Filled 2016-01-09: qty 1000

## 2016-01-09 MED ORDER — HEPARIN SOD (PORK) LOCK FLUSH 100 UNIT/ML IV SOLN: 500.0000 [IU] | Freq: Once | INTRAVENOUS | Status: DC

## 2016-01-09 NOTE — Progress Notes (Signed)
[facility] NOTE  Patient Care Team: Tracie Harrier, MD as PCP - General (Internal Medicine) Noreene Filbert, MD as Referring Physician (Radiation Oncology) Algernon Huxley, MD as Referring Physician (Vascular Surgery)  CHIEF COMPLAINTS/PURPOSE OF CONSULTATION:   Oncology History   # April 2017-[facility]CC [STAGE IVA T4N2; Dr.McQueen; Left floor of mouth -Bx; Left neck LN-FNA+] left tongue/ floor of mouth- 4 x 3 x 2 cm.; 4.3 x 3.3 x 3 cm complex mass extends through the left mylohyoid muscle below the left mandible anterior to left submandibular gland] - START Cis [5/15]- weekly with RT [5/17]  # SEP 21st 2017- PET progression; OCT 2017- NIVO every 2 w  # s/p PEG [September 06 2015]  # Smoker # weight loss     Cancer of floor of mouth ([place])   07/10/2015 Initial Diagnosis    Cancer of floor of mouth ([place])       HISTORY OF PRESENTING ILLNESS:  Rebecca Mcmillan 71 y.o.  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 Patient is status post cycle #1  Denies any significant pain. She continues to notice left mandibular mass- not any smaller.  Complains of significant fatigue. Most of the nutrition is through PEG tube. Also complains of fatigue.patient complains of difficulty sleeping at night. Denies any pain. Possible anxiety.  ROS: A complete 10 point review of system is done which is negative except mentioned above in history of present illness  MEDICAL HISTORY:  Past Medical History:  Diagnosis Date  . Dysphagia   . G tube feedings ([place])   . Hyperlipidemia   . Hypertension   . Hypokalemia   . Hypothyroidism   . Malnourished ([place])   . Metastatic cancer ([place])   . Oral-mouth cancer ([place])    tongue  . Peripheral vascular disease ([employer])   . Thyroid disease     SURGICAL HISTORY: None  SOCIAL HISTORY: Patient  previously used to work in nursing homes. Denies any alcohol. Smokes a pack of Celexa day for 45 years lives with her husband in [place]. No children.  Social History   Social History  . Marital status: Married    Spouse name: N/A  . Number of children: N/A  . Years of education: N/A   Occupational History  . Not on file.   Social History Main Topics  . Smoking status: Former Smoker    Packs/day: 1.00    Years: 45.00    Types: Cigarettes    Quit date: 06/19/2015  . Smokeless tobacco: Never Used  . Alcohol use No  . Drug use: No  . Sexual activity: Not on file   Other Topics Concern  . Not on file   Social History Narrative  . No narrative on file    FAMILY HISTORY:no family history of head and neck cancer.   ALLERGIES:  is allergic to garlic; lactose intolerance (gi); and tetracyclines & related.  MEDICATIONS:  Current Outpatient Prescriptions  Medication Sig Dispense Refill  . atenolol (TENORMIN) 25 MG tablet Take 25 mg by mouth daily.     Marland Kitchen docusate sodium (COLACE) 100 MG capsule Take 100 mg by mouth 2 (two) times daily.    . Gauze Pads & Dressings (DERMACEA DRAIN SPONGES) 4"X4" PADS 1 each by Does not apply route 1 day or 1 dose. 50 each 0  . Irrigation Supplies (  PISTON IRRIGATION SYRINGE) MISC 1 Syringe by Does not apply route 1 day or 1 dose. 30 each 0  . levothyroxine (SYNTHROID, LEVOTHROID) 50 MCG tablet     . lidocaine-prilocaine (EMLA) cream Apply 1 application topically as needed. 30 g 6  . lovastatin (MEVACOR) 40 MG tablet Take 40 mg by mouth every evening.     . magic mouthwash w/lidocaine SOLN Take 5 mLs by mouth 4 (four) times daily. 80 ml viscous lidocaine 2%, 80 ml Mylanta, 80 ml Diphenhydramine 12.5 mg/5 ml Elixir, 80 ml Nystatin 100,000 Unit suspension, 80 ml Prednisolone 15 mg/68ml, 80 ml Distilled Water. Sig: Swish/Swallow 5-10 ml four times a day as needed. Dispense 480 ml. 3RFs 480 mL 3  . Nutritional Supplements (FEEDING SUPPLEMENT, JEVITY 1.5 CAL,)  LIQD Place 1,000 mLs into feeding tube continuous. 4 to 5 cans via tube feed    . polyethylene glycol (MIRALAX / GLYCOLAX) packet Take 17 g by mouth daily.    . Water For Irrigation, Sterile (STERILE WATER FOR IRRIGATION) Irrigate with 1,000 mLs as directed once. 1000 mL 0  . nystatin (MYCOSTATIN) 100000 UNIT/ML suspension Take 5 mLs (500,000 Units total) by mouth 4 (four) times daily. (Patient not taking: Reported on 01/09/2016) 60 mL 0  . ondansetron (ZOFRAN) 8 MG tablet TAKE 1 TABLET EVERY 8 HOURS AS NEEDED FOR NAUSEA AND VOMITING (START 3 DAYS AFTER CHEMOTHERAPY) (Patient not taking: Reported on 01/09/2016) 40 tablet 0  . prochlorperazine (COMPAZINE) 10 MG tablet TAKE 1 TABLET EVERY 6 HOURS AS NEEDED FOR NAUSEA OR VOMITING. (Patient not taking: Reported on 01/09/2016) 30 tablet 0   No current facility-administered medications for this visit.       Marland Kitchen  PHYSICAL EXAMINATION: ECOG PERFORMANCE STATUS: 1 - Symptomatic but completely ambulatory  Vitals:   01/09/16 0944  BP: (!) 147/90  Pulse: (!) 102  Resp: 18  Temp: (!) 95.5 F (35.3 C)   Filed Weights   01/09/16 0944  Weight: 71 lb 12.8 oz (32.6 kg)    GENERAL: Thin built moderately nourished Caucasian female patient anxious. Alert, no distress and comfortable. She is alone.  EYES: no pallor or icterus OROPHARYNX: no thrush or ulceration; poor dentition. No obvious masses noted in the oral cavity.Thick secretions noted NECK: supple, 5-6cm mass[soft/cystic]- submandibular mass. Non-tender. Right neck 2-3cm LN felt.  LYMPH:  no palpable lymphadenopathy xillary or inguinal regions LUNGS: Decreased breath sounds bilaterally No wheeze or crackles; barrel chested.  HEART/CVS: regular rate & rhythm and no murmurs; No lower extremity edema ABDOMEN: abdomen soft, non-tender and normal bowel sounds Musculoskeletal:no cyanosis of digits and no clubbing  PSYCH: alert & oriented x 3 with fluent speech NEURO: no focal motor/sensory  deficits SKIN:  no rashes or significant lesions  LABORATORY DATA:  I have reviewed the data as listed Lab Results  Component Value Date   WBC 7.4 01/09/2016   HGB 9.8 (L) 01/09/2016   HCT 27.3 (L) 01/09/2016   MCV 102.3 (H) 01/09/2016   PLT 209 01/09/2016    Recent Labs  12/12/15 0916 12/26/15 0946 01/09/16 0930  NA 126* 125* 124*  K 3.8 3.5 3.9  CL 93* 89* 90*  CO2 25 26 26   GLUCOSE 144* 102* 122*  BUN 15 22* 17  CREATININE 0.58 0.56 0.73  CALCIUM 8.9 8.8* 8.7*  GFRNONAA >60 >60 >60  GFRAA >60 >60 >60  PROT 6.9 6.7 6.4*  ALBUMIN 3.8 3.7 3.5  AST 20 18 22   ALT 14 14 14   ALKPHOS 60  56 62  BILITOT 0.7 0.7 0.5   IMPRESSION: 1. Intense metabolic activity associated with the superior medial aspect of the cystic solid lesion in the floor of the mouth. Metabolic activity is decreased from comparison exam but the size of the cystic portion is increased. The metabolic activity remains intense with SUV max equal 8.7. 2. Persistent intense metabolic activity in the medial anterior floor of the mouth unchanged from prior. 3. Persistent hypermetabolic activity along the body LEFT mandible is slightly decreased. 4. Increase in size and metabolic activity of a high level II lymph node on the LEFT. 5. New hypermetabolic level I lymph node on the RIGHT anterior to the submandibular gland. 6. No evidence of metastatic disease below the neck.   Electronically Signed   By: Suzy Bouchard M.D.   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  Progressive disease on PET;  September 21st 2017. Currently on palliative treatments with OPDIVO s/p #1;  # Proceed with cycle # 2 today. Labs reviewed; okay except ssodium-124/Hb 9.8   # Hyponatremia- 124. Increase salt intake/ fluid intake.   # Insomnia- Tylenol PM prn.   # For nutrition weight  loss-  Status post PEG tube placement. On tube feeds; also on PO intake.   # follow up with 2 weeks/MD/Labs.   Cammie Sickle, MD   01/09/2016 3:49 PM  .

## 2016-01-09 NOTE — Progress Notes (Signed)
Pt reports that she is having more trouble sleeping at night and continues the same concerns with appetite.

## 2016-01-09 NOTE — Assessment & Plan Note (Addendum)
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  Progressive disease on PET;  September 21st 2017. Currently on palliative treatments with OPDIVO s/p #1;  # Proceed with cycle # 2 today. Labs reviewed; okay except ssodium-124/Hb 9.8   # Hyponatremia- 124. Increase salt intake/ fluid intake.   # Insomnia- Tylenol PM prn.   # For nutrition weight loss-  Status post PEG tube placement. On tube feeds; also on PO intake.   # follow up with 2 weeks/MD/Labs.

## 2016-01-23 ENCOUNTER — Inpatient Hospital Stay: Payer: Commercial Managed Care - HMO

## 2016-01-23 ENCOUNTER — Inpatient Hospital Stay: Payer: Commercial Managed Care - HMO | Attending: Internal Medicine | Admitting: Internal Medicine

## 2016-01-23 DIAGNOSIS — I10 Essential (primary) hypertension: Secondary | ICD-10-CM | POA: Insufficient documentation

## 2016-01-23 DIAGNOSIS — F17210 Nicotine dependence, cigarettes, uncomplicated: Secondary | ICD-10-CM | POA: Insufficient documentation

## 2016-01-23 DIAGNOSIS — I739 Peripheral vascular disease, unspecified: Secondary | ICD-10-CM | POA: Diagnosis not present

## 2016-01-23 DIAGNOSIS — C049 Malignant neoplasm of floor of mouth, unspecified: Secondary | ICD-10-CM | POA: Insufficient documentation

## 2016-01-23 DIAGNOSIS — E871 Hypo-osmolality and hyponatremia: Secondary | ICD-10-CM | POA: Insufficient documentation

## 2016-01-23 DIAGNOSIS — E039 Hypothyroidism, unspecified: Secondary | ICD-10-CM | POA: Diagnosis not present

## 2016-01-23 DIAGNOSIS — Z5111 Encounter for antineoplastic chemotherapy: Secondary | ICD-10-CM | POA: Diagnosis not present

## 2016-01-23 DIAGNOSIS — Z79899 Other long term (current) drug therapy: Secondary | ICD-10-CM | POA: Insufficient documentation

## 2016-01-23 DIAGNOSIS — E785 Hyperlipidemia, unspecified: Secondary | ICD-10-CM | POA: Insufficient documentation

## 2016-01-23 DIAGNOSIS — Z931 Gastrostomy status: Secondary | ICD-10-CM | POA: Diagnosis not present

## 2016-01-23 LAB — CBC WITH DIFFERENTIAL/PLATELET
BASOS ABS: 0 10*3/uL (ref 0–0.1)
Basophils Relative: 1 %
Eosinophils Absolute: 0 10*3/uL (ref 0–0.7)
Eosinophils Relative: 0 %
HCT: 28.3 % — ABNORMAL LOW (ref 35.0–47.0)
HEMOGLOBIN: 10.1 g/dL — AB (ref 12.0–16.0)
LYMPHS ABS: 0.6 10*3/uL — AB (ref 1.0–3.6)
LYMPHS PCT: 6 %
MCH: 36.2 pg — AB (ref 26.0–34.0)
MCHC: 35.7 g/dL (ref 32.0–36.0)
MCV: 101.4 fL — AB (ref 80.0–100.0)
Monocytes Absolute: 0.6 10*3/uL (ref 0.2–0.9)
Monocytes Relative: 7 %
NEUTROS PCT: 86 %
Neutro Abs: 8.1 10*3/uL — ABNORMAL HIGH (ref 1.4–6.5)
Platelets: 238 10*3/uL (ref 150–440)
RBC: 2.79 MIL/uL — AB (ref 3.80–5.20)
RDW: 14.3 % (ref 11.5–14.5)
WBC: 9.4 10*3/uL (ref 3.6–11.0)

## 2016-01-23 LAB — COMPREHENSIVE METABOLIC PANEL
ALK PHOS: 70 U/L (ref 38–126)
ALT: 13 U/L — AB (ref 14–54)
AST: 17 U/L (ref 15–41)
Albumin: 3.4 g/dL — ABNORMAL LOW (ref 3.5–5.0)
Anion gap: 7 (ref 5–15)
BUN: 12 mg/dL (ref 6–20)
CO2: 29 mmol/L (ref 22–32)
Calcium: 8.5 mg/dL — ABNORMAL LOW (ref 8.9–10.3)
Chloride: 90 mmol/L — ABNORMAL LOW (ref 101–111)
Creatinine, Ser: 0.61 mg/dL (ref 0.44–1.00)
GLUCOSE: 98 mg/dL (ref 65–99)
Potassium: 3.8 mmol/L (ref 3.5–5.1)
Sodium: 126 mmol/L — ABNORMAL LOW (ref 135–145)
TOTAL PROTEIN: 6.5 g/dL (ref 6.5–8.1)
Total Bilirubin: 0.4 mg/dL (ref 0.3–1.2)

## 2016-01-23 LAB — SAMPLE TO BLOOD BANK

## 2016-01-23 LAB — MAGNESIUM: Magnesium: 1.9 mg/dL (ref 1.7–2.4)

## 2016-01-23 LAB — TSH: TSH: 8.803 u[IU]/mL — ABNORMAL HIGH (ref 0.350–4.500)

## 2016-01-23 MED ORDER — SODIUM CHLORIDE 0.9 % IV SOLN
Freq: Once | INTRAVENOUS | Status: AC
  Administered 2016-01-23: 12:00:00 via INTRAVENOUS
  Filled 2016-01-23: qty 1000

## 2016-01-23 MED ORDER — HEPARIN SOD (PORK) LOCK FLUSH 100 UNIT/ML IV SOLN
500.0000 [IU] | Freq: Once | INTRAVENOUS | Status: AC | PRN
  Administered 2016-01-23: 500 [IU]
  Filled 2016-01-23: qty 5

## 2016-01-23 MED ORDER — SODIUM CHLORIDE 0.9 % IV SOLN
3.0000 mg/kg | Freq: Once | INTRAVENOUS | Status: AC
  Administered 2016-01-23: 100 mg via INTRAVENOUS
  Filled 2016-01-23: qty 10

## 2016-01-23 NOTE — Progress Notes (Signed)
[facility] NOTE  Patient Care Team: Tracie Harrier, MD as PCP - General (Internal Medicine) Noreene Filbert, MD as Referring Physician (Radiation Oncology) Algernon Huxley, MD as Referring Physician (Vascular Surgery)  CHIEF COMPLAINTS/PURPOSE OF CONSULTATION:   Oncology History   # April 2017-SCC [STAGE IVA T4N2; Dr.McQueen; Left floor of mouth -Bx; Left neck LN-FNA+] left tongue/ floor of mouth- 4 x 3 x 2 cm.; 4.3 x 3.3 x 3 cm complex mass extends through the left mylohyoid muscle below the left mandible anterior to left submandibular gland] - START Cis [5/15]- weekly with RT [5/17]  # SEP 21st 2017- PET progression; OCT 2017- NIVO every 2 w  # s/p PEG [September 06 2015]  # Smoker # weight loss     Cancer of floor of mouth ([place])   07/10/2015 Initial Diagnosis    Cancer of floor of mouth ([place])       HISTORY OF PRESENTING ILLNESS:  Rebecca Mcmillan 70 y.o.  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 Patient is status post cycle #2.   Denies any significant pain. She continues to notice left mandibular mass- not any smaller.   Complains of significant fatigue. Most of the nutrition is through PEG tube. No headaches or vision changes.  ROS: A complete 10 point review of system is done which is negative except mentioned above in history of present illness  MEDICAL HISTORY:  Past Medical History:  Diagnosis Date  . Dysphagia   . G tube feedings ([place])   . Hyperlipidemia   . Hypertension   . Hypokalemia   . Hypothyroidism   . Malnourished ([place])   . Metastatic cancer ([place])   . Oral-mouth cancer ([place])    tongue  . Peripheral vascular disease ([place])   . Thyroid disease     SURGICAL HISTORY: None  SOCIAL HISTORY: Patient previously used to work in nursing homes. Denies any alcohol. Smokes a pack of  Celexa day for 45 years lives with her husband in [place]. No children.  Social History   Social History  . Marital status: Married    Spouse name: N/A  . Number of children: N/A  . Years of education: N/A   Occupational History  . Not on file.   Social History Main Topics  . Smoking status: Former Smoker    Packs/day: 1.00    Years: 45.00    Types: Cigarettes    Quit date: 06/19/2015  . Smokeless tobacco: Never Used  . Alcohol use No  . Drug use: No  . Sexual activity: Not on file   Other Topics Concern  . Not on file   Social History Narrative  . No narrative on file    FAMILY HISTORY:no family history of head and neck cancer.   ALLERGIES:  is allergic to garlic; lactose intolerance (gi); and tetracyclines & related.  MEDICATIONS:  Current Outpatient Prescriptions  Medication Sig Dispense Refill  . atenolol (TENORMIN) 25 MG tablet Take 25 mg by mouth daily.     Marland Kitchen docusate sodium (COLACE) 100 MG capsule Take 100 mg by mouth 2 (two) times daily.    . Gauze Pads & Dressings (DERMACEA DRAIN SPONGES) 4"X4" PADS 1 each by Does not apply route 1 day or 1 dose. 50 each 0  . Irrigation Supplies (PISTON IRRIGATION SYRINGE) MISC 1 Syringe by Does  not apply route 1 day or 1 dose. 30 each 0  . levothyroxine (SYNTHROID, LEVOTHROID) 50 MCG tablet     . lidocaine-prilocaine (EMLA) cream Apply 1 application topically as needed. 30 g 6  . lovastatin (MEVACOR) 40 MG tablet Take 40 mg by mouth every evening.     . magic mouthwash w/lidocaine SOLN Take 5 mLs by mouth 4 (four) times daily. 80 ml viscous lidocaine 2%, 80 ml Mylanta, 80 ml Diphenhydramine 12.5 mg/5 ml Elixir, 80 ml Nystatin 100,000 Unit suspension, 80 ml Prednisolone 15 mg/47ml, 80 ml Distilled Water. Sig: Swish/Swallow 5-10 ml four times a day as needed. Dispense 480 ml. 3RFs 480 mL 3  . Nutritional Supplements (FEEDING SUPPLEMENT, JEVITY 1.5 CAL,) LIQD Place 1,000 mLs into feeding tube continuous. 4 to 5 cans via tube feed     . nystatin (MYCOSTATIN) 100000 UNIT/ML suspension Take 5 mLs (500,000 Units total) by mouth 4 (four) times daily. 60 mL 0  . ondansetron (ZOFRAN) 8 MG tablet TAKE 1 TABLET EVERY 8 HOURS AS NEEDED FOR NAUSEA AND VOMITING (START 3 DAYS AFTER CHEMOTHERAPY) 40 tablet 0  . polyethylene glycol (MIRALAX / GLYCOLAX) packet Take 17 g by mouth daily.    . prochlorperazine (COMPAZINE) 10 MG tablet TAKE 1 TABLET EVERY 6 HOURS AS NEEDED FOR NAUSEA OR VOMITING. 30 tablet 0  . Water For Irrigation, Sterile (STERILE WATER FOR IRRIGATION) Irrigate with 1,000 mLs as directed once. 1000 mL 0   No current facility-administered medications for this visit.       Marland Kitchen  PHYSICAL EXAMINATION: ECOG PERFORMANCE STATUS: 1 - Symptomatic but completely ambulatory  Vitals:   01/23/16 1023  BP: (!) 150/100  Pulse: 80  Resp: 18  Temp: 97 F (36.1 C)   Filed Weights   01/23/16 1023  Weight: 74 lb 9.6 oz (33.8 kg)    GENERAL: Thin built moderately nourished Caucasian female patient anxious. Alert, no distress and comfortable. She is alone.  EYES: no pallor or icterus OROPHARYNX: no thrush or ulceration; poor dentition. No obvious masses noted in the oral cavity.Thick secretions noted NECK: supple, 5-6cm mass[soft/cystic]- submandibular mass. Non-tender. Right neck 3x5 x3.5 mass [increasing] LN felt.  LYMPH:  no palpable lymphadenopathy xillary or inguinal regions LUNGS: Decreased breath sounds bilaterally No wheeze or crackles; barrel chested.  HEART/CVS: regular rate & rhythm and no murmurs; No lower extremity edema ABDOMEN: abdomen soft, non-tender and normal bowel sounds Musculoskeletal:no cyanosis of digits and no clubbing  PSYCH: alert & oriented x 3 with fluent speech NEURO: no focal motor/sensory deficits SKIN:  no rashes or significant lesions  LABORATORY DATA:  I have reviewed the data as listed Lab Results  Component Value Date   WBC 9.4 01/23/2016   HGB 10.1 (L) 01/23/2016   HCT 28.3 (L)  01/23/2016   MCV 101.4 (H) 01/23/2016   PLT 238 01/23/2016    Recent Labs  12/26/15 0946 01/09/16 0930 01/23/16 0957  NA 125* 124* 126*  K 3.5 3.9 3.8  CL 89* 90* 90*  CO2 26 26 29   GLUCOSE 102* 122* 98  BUN 22* 17 12  CREATININE 0.56 0.73 0.61  CALCIUM 8.8* 8.7* 8.5*  GFRNONAA >60 >60 >60  GFRAA >60 >60 >60  PROT 6.7 6.4* 6.5  ALBUMIN 3.7 3.5 3.4*  AST 18 22 17   ALT 14 14 13*  ALKPHOS 56 62 70  BILITOT 0.7 0.5 0.4   IMPRESSION: 1. Intense metabolic activity associated with the superior medial aspect of the cystic solid lesion  in the floor of the mouth. Metabolic activity is decreased from comparison exam but the size of the cystic portion is increased. The metabolic activity remains intense with SUV max equal 8.7. 2. Persistent intense metabolic activity in the medial anterior floor of the mouth unchanged from prior. 3. Persistent hypermetabolic activity along the body LEFT mandible is slightly decreased. 4. Increase in size and metabolic activity of a high level II lymph node on the LEFT. 5. New hypermetabolic level I lymph node on the RIGHT anterior to the submandibular gland. 6. No evidence of metastatic disease below the neck.   Electronically Signed   By: Suzy Bouchard M.D.   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  Progressive disease on PET;  September 21st 2017. Currently on palliative treatments with OPDIVO s/p #2; no significant improvement noted yet.   # Proceed with cycle # 3 today. Labs reviewed; okay except ssodium-126/Hb 9.8   # Hyponatremia- 126-improving. Increase salt intake/ fluid intake.   # hypothyroidism recent TSH 6; awaiting from today;   # For nutrition weight loss-  Status post PEG tube placement. On tube feeds; also on PO intake. Slight improvement noted.   # follow up with 3 weeks/MD/Labs;  we will check alpha gal is pending.    Cammie Sickle, MD   01/23/2016 2:02 PM  .

## 2016-01-23 NOTE — Assessment & Plan Note (Addendum)
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  Progressive disease on PET;  September 21st 2017. Currently on palliative treatments with OPDIVO s/p #2; no significant improvement noted yet.   # Proceed with cycle # 3 today. Labs reviewed; okay except ssodium-126/Hb 9.8   # Hyponatremia- 126-improving. Increase salt intake/ fluid intake.   # hypothyroidism recent TSH 6; awaiting from today;   # For nutrition weight loss-  Status post PEG tube placement. On tube feeds; also on PO intake. Slight improvement noted.   # follow up with 3 weeks/MD/Labs; we will check alpha gal is pending.

## 2016-01-23 NOTE — Progress Notes (Signed)
Patient is here for follow up, she thinks she is coming down with a cold. Coughing up mucus, sometimes it is clear

## 2016-01-24 ENCOUNTER — Telehealth: Payer: Self-pay | Admitting: *Deleted

## 2016-01-24 DIAGNOSIS — E039 Hypothyroidism, unspecified: Secondary | ICD-10-CM

## 2016-01-24 MED ORDER — LEVOTHYROXINE SODIUM 25 MCG PO TABS: 25.0000 ug | ORAL_TABLET | Freq: Every day | ORAL | 3 refills | Status: AC

## 2016-01-24 NOTE — Telephone Encounter (Signed)
Spoke with patient. Explained new dosing. Will synthroid dosing increase dose to 75 mg daily. Pt inquired why dose needed to be changed.  I explained that pt's opdivo is most likely causing the abn thyroid function.  New rx sent to [facility].  Teach back process performed with pt.

## 2016-01-24 NOTE — Telephone Encounter (Signed)
-----   Message from Cammie Sickle, MD sent at 01/23/2016  6:04 PM EST ----- Recommend increasing the synthroid to a total of 44mcg/day. Please inform pt; and give 25 mcg script if needed. Thx

## 2016-01-27 DIAGNOSIS — Z87891 Personal history of nicotine dependence: Secondary | ICD-10-CM | POA: Diagnosis not present

## 2016-01-27 DIAGNOSIS — I10 Essential (primary) hypertension: Secondary | ICD-10-CM | POA: Diagnosis not present

## 2016-01-27 DIAGNOSIS — E039 Hypothyroidism, unspecified: Secondary | ICD-10-CM | POA: Diagnosis not present

## 2016-01-27 DIAGNOSIS — E7800 Pure hypercholesterolemia, unspecified: Secondary | ICD-10-CM | POA: Diagnosis not present

## 2016-01-27 DIAGNOSIS — C049 Malignant neoplasm of floor of mouth, unspecified: Secondary | ICD-10-CM | POA: Diagnosis not present

## 2016-01-27 DIAGNOSIS — Z23 Encounter for immunization: Secondary | ICD-10-CM | POA: Diagnosis not present

## 2016-02-13 ENCOUNTER — Inpatient Hospital Stay (HOSPITAL_BASED_OUTPATIENT_CLINIC_OR_DEPARTMENT_OTHER): Payer: Commercial Managed Care - HMO | Admitting: Internal Medicine

## 2016-02-13 ENCOUNTER — Inpatient Hospital Stay: Payer: Commercial Managed Care - HMO

## 2016-02-13 ENCOUNTER — Inpatient Hospital Stay (HOSPITAL_BASED_OUTPATIENT_CLINIC_OR_DEPARTMENT_OTHER): Payer: Commercial Managed Care - HMO

## 2016-02-13 VITALS — BP 133/80 | HR 100 | Temp 97.9°F | Resp 18 | Wt 71.6 lb

## 2016-02-13 DIAGNOSIS — Z79899 Other long term (current) drug therapy: Secondary | ICD-10-CM | POA: Diagnosis not present

## 2016-02-13 DIAGNOSIS — E039 Hypothyroidism, unspecified: Secondary | ICD-10-CM | POA: Diagnosis not present

## 2016-02-13 DIAGNOSIS — C049 Malignant neoplasm of floor of mouth, unspecified: Secondary | ICD-10-CM

## 2016-02-13 DIAGNOSIS — E785 Hyperlipidemia, unspecified: Secondary | ICD-10-CM | POA: Diagnosis not present

## 2016-02-13 DIAGNOSIS — F17210 Nicotine dependence, cigarettes, uncomplicated: Secondary | ICD-10-CM | POA: Diagnosis not present

## 2016-02-13 DIAGNOSIS — Z5111 Encounter for antineoplastic chemotherapy: Secondary | ICD-10-CM | POA: Diagnosis not present

## 2016-02-13 DIAGNOSIS — Z931 Gastrostomy status: Secondary | ICD-10-CM | POA: Diagnosis not present

## 2016-02-13 DIAGNOSIS — E871 Hypo-osmolality and hyponatremia: Secondary | ICD-10-CM | POA: Diagnosis not present

## 2016-02-13 DIAGNOSIS — I10 Essential (primary) hypertension: Secondary | ICD-10-CM | POA: Diagnosis not present

## 2016-02-13 LAB — CBC WITH DIFFERENTIAL/PLATELET
BASOS ABS: 0.1 10*3/uL (ref 0–0.1)
Basophils Relative: 1 %
EOS PCT: 0 %
Eosinophils Absolute: 0 10*3/uL (ref 0–0.7)
HEMATOCRIT: 27.2 % — AB (ref 35.0–47.0)
Hemoglobin: 9.6 g/dL — ABNORMAL LOW (ref 12.0–16.0)
LYMPHS ABS: 0.4 10*3/uL — AB (ref 1.0–3.6)
LYMPHS PCT: 4 %
MCH: 35.8 pg — AB (ref 26.0–34.0)
MCHC: 35.4 g/dL (ref 32.0–36.0)
MCV: 101 fL — AB (ref 80.0–100.0)
MONO ABS: 0.5 10*3/uL (ref 0.2–0.9)
Monocytes Relative: 5 %
NEUTROS ABS: 9.1 10*3/uL — AB (ref 1.4–6.5)
Neutrophils Relative %: 90 %
Platelets: 255 10*3/uL (ref 150–440)
RBC: 2.69 MIL/uL — ABNORMAL LOW (ref 3.80–5.20)
RDW: 14.8 % — AB (ref 11.5–14.5)
WBC: 10.1 10*3/uL (ref 3.6–11.0)

## 2016-02-13 LAB — COMPREHENSIVE METABOLIC PANEL
ALT: 12 U/L — AB (ref 14–54)
AST: 21 U/L (ref 15–41)
Albumin: 3.3 g/dL — ABNORMAL LOW (ref 3.5–5.0)
Alkaline Phosphatase: 75 U/L (ref 38–126)
Anion gap: 7 (ref 5–15)
BILIRUBIN TOTAL: 0.5 mg/dL (ref 0.3–1.2)
BUN: 13 mg/dL (ref 6–20)
CO2: 28 mmol/L (ref 22–32)
CREATININE: 0.6 mg/dL (ref 0.44–1.00)
Calcium: 8.7 mg/dL — ABNORMAL LOW (ref 8.9–10.3)
Chloride: 92 mmol/L — ABNORMAL LOW (ref 101–111)
Glucose, Bld: 158 mg/dL — ABNORMAL HIGH (ref 65–99)
POTASSIUM: 3.5 mmol/L (ref 3.5–5.1)
Sodium: 127 mmol/L — ABNORMAL LOW (ref 135–145)
TOTAL PROTEIN: 6.3 g/dL — AB (ref 6.5–8.1)

## 2016-02-13 LAB — SAMPLE TO BLOOD BANK

## 2016-02-13 LAB — MAGNESIUM: MAGNESIUM: 1.9 mg/dL (ref 1.7–2.4)

## 2016-02-13 MED ORDER — HEPARIN SOD (PORK) LOCK FLUSH 100 UNIT/ML IV SOLN
INTRAVENOUS | Status: AC
  Filled 2016-02-13: qty 5

## 2016-02-13 MED ORDER — HEPARIN SOD (PORK) LOCK FLUSH 100 UNIT/ML IV SOLN
500.0000 [IU] | Freq: Once | INTRAVENOUS | Status: AC
  Administered 2016-02-13: 500 [IU] via INTRAVENOUS

## 2016-02-13 NOTE — Progress Notes (Signed)
Patient is here for follow up, she mentions her hands feel numb every once in a while, she has a feeding tube she has no appetite.

## 2016-02-13 NOTE — Progress Notes (Signed)
DISCONTINUE ON PATHWAY REGIMEN - Head and Neck  [id number]: Nivolumab 3 mg/kg q 14 Days Until Progression or Unacceptable Toxicity   A cycle is every 14 days:     Nivolumab (Opdivo(R)) 3 mg/kg in 100 mL NS IV over 60 minutes every 2 weeks until disease progression or unacceptable toxicity. Inline filter required (low protein binding). Dose Mod: None Additional Orders: Severe immune-mediated reactions can occur (e.g. pneumonitis, colitis, and hepatitis).  See prescribing information for more details including monitoring and required immediate management with steroids. Monitor thyroid, renal, liver  function tests, glucose, and sodium at baseline and periodically during therapy.  **Always confirm dose/schedule in your pharmacy ordering system**    REASON: Disease Progression PRIOR TREATMENT: [id number]: Nivolumab 3 mg/kg q 14 Days Until Progression or Unacceptable Toxicity TREATMENT RESPONSE: Progressive Disease (PD)  START ON PATHWAY REGIMEN - Head and Neck  [id number]: Paclitaxel 80 mg/m2 Weekly   Administer weekly:     Paclitaxel (Taxol(R)) 80 mg/m2 in 250 mL NS IV over 1 hour Dose Mod: None  **Always confirm dose/schedule in your pharmacy ordering system**    Patient Characteristics: Oral Cavity, Metastatic (Squamous Cell), Third Line Disease Classification: Oral Cavity AJCC M Stage: X AJCC N Stage: X AJCC T Stage: X Current Disease Status: Distant Metastases AJCC Stage Grouping: IVB Line of therapy: Third Line Would you be surprised if this patient died  in the next year? I would be surprised if this patient died in the next year  Intent of Therapy: Non-Curative / Palliative Intent, Discussed with Patient

## 2016-02-13 NOTE — Assessment & Plan Note (Addendum)
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  Progressive disease on PET;  September 21st 2017. Significant clinical progression noted on after 3 cycles of Opdivo.   # Had a lengthy discussion regarding overall poor prognosis; and again all treatments are palliative. Patient very concerned about potential side effects of chemotherapy. She states that if she has any side effects she will continue treatment.  # HOLD further OPDIVO; start carbo-taxol every 3 weeks. Also discussed regarding 5-FU carboplatin-cetuximab/ extreme trial. Awaiting on Alpha-Gal testing. Discussed the potential side effects of chemotherapy at length. If continued progression/ poor tolerance to therapy- hospice would be recommended. This was also discussed. She'll reluctantly agrees to proceed with next line of therapy.   Growth factor-Neulasta/On pro would be given as prophylaxis for chemotherapy-induced neutropenia to prevent febrile neutropenias. Discussed potential side effect- myalgias/arthralgias.   # Hyponatremia- 126-improving. Increase salt intake/ fluid intake.   # hypothyroidism recent TSH 8; on snthroid.   # Start carbotaxol next week; follow-up with me 10 days later labs.

## 2016-02-13 NOTE — Progress Notes (Signed)
[facility] NOTE  Patient Care Team: Tracie Harrier, MD as PCP - General (Internal Medicine) Noreene Filbert, MD as Referring Physician (Radiation Oncology) Algernon Huxley, MD as Referring Physician (Vascular Surgery)  CHIEF COMPLAINTS/PURPOSE OF CONSULTATION:   Oncology History   # April 2017-SCC [STAGE IVA T4N2; Dr.McQueen; Left floor of mouth -Bx; Left neck LN-FNA+] left tongue/ floor of mouth- 4 x 3 x 2 cm.; 4.3 x 3.3 x 3 cm complex mass extends through the left mylohyoid muscle below the left mandible anterior to left submandibular gland] - START Cis [5/15]- weekly with RT [5/17]  # SEP 21st 2017- PET progression; OCT 2017- NIVO every 2 w x3;   # NOV 30th- PROGRESSION - recommend carbo-taxol q 3 W  # s/p PEG [September 06 2015]  # Smoker # weight loss     Cancer of floor of mouth ([place])   07/10/2015 Initial Diagnosis    Cancer of floor of mouth ([place])       HISTORY OF PRESENTING ILLNESS:  Rebecca Mcmillan 70 y.o.  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 Patient is status post cycle #3 cycles.  Patient noted to have significant swelling of the left jaw; and also of the right submandibular lesion. Interestingly continues to complain of any significant pain.  Complains of significant fatigue. Most of the nutrition is through PEG tube. No headaches or vision changes.  ROS: A complete 10 point review of system is done which is negative except mentioned above in history of present illness  MEDICAL HISTORY:  Past Medical History:  Diagnosis Date  . Dysphagia   . G tube feedings ([place])   . Hyperlipidemia   . Hypertension   . Hypokalemia   . Hypothyroidism   . Malnourished ([place])   . Metastatic cancer ([place])   . 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 Peripheral vascular disease ([place])   . Thyroid disease      SURGICAL HISTORY: None  SOCIAL HISTORY: Patient previously used to work in nursing homes. Denies any alcohol. Smokes a pack of Celexa day for 45 years lives with her husband in [place]. No children.  Social History   Social History  . Marital status: Married    Spouse name: N/A  . Number of children: N/A  . Years of education: N/A   Occupational History  . Not on file.   Social History Main Topics  . Smoking status: Former Smoker    Packs/day: 1.00    Years: 45.00    Types: Cigarettes    Quit date: 06/19/2015  . Smokeless tobacco: Never Used  . Alcohol use No  . Drug use: No  . Sexual activity: Not on file   Other Topics Concern  . Not on file   Social History Narrative  . No narrative on file    FAMILY HISTORY:no family history of head and neck cancer.   ALLERGIES:  is allergic to garlic; lactose intolerance (gi); and tetracyclines & related.  MEDICATIONS:  Current Outpatient Prescriptions  Medication Sig Dispense Refill  . atenolol (TENORMIN) 25 MG tablet Take 25 mg by mouth daily.     Marland Kitchen docusate sodium (COLACE) 100 MG capsule Take 100 mg by mouth 2 (two) times daily.    . Gauze Pads & Dressings (DERMACEA DRAIN SPONGES) 4"X4" PADS 1 each by Does  not apply route 1 day or 1 dose. 50 each 0  . Irrigation Supplies (PISTON IRRIGATION SYRINGE) MISC 1 Syringe by Does not apply route 1 day or 1 dose. 30 each 0  . levothyroxine (SYNTHROID, LEVOTHROID) 25 MCG tablet Take 1 tablet (25 mcg total) by mouth daily before breakfast. 30 tablet 3  . levothyroxine (SYNTHROID, LEVOTHROID) 50 MCG tablet     . lidocaine-prilocaine (EMLA) cream Apply 1 application topically as needed. 30 g 6  . losartan (COZAAR) 100 MG tablet     . lovastatin (MEVACOR) 40 MG tablet Take 40 mg by mouth every evening.     . magic mouthwash w/lidocaine SOLN Take 5 mLs by mouth 4 (four) times daily. 80 ml viscous lidocaine 2%, 80 ml Mylanta, 80 ml Diphenhydramine 12.5 mg/5 ml Elixir, 80 ml Nystatin  100,000 Unit suspension, 80 ml Prednisolone 15 mg/76ml, 80 ml Distilled Water. Sig: Swish/Swallow 5-10 ml four times a day as needed. Dispense 480 ml. 3RFs 480 mL 3  . Nutritional Supplements (FEEDING SUPPLEMENT, JEVITY 1.5 CAL,) LIQD Place 1,000 mLs into feeding tube continuous. 4 to 5 cans via tube feed    . nystatin (MYCOSTATIN) 100000 UNIT/ML suspension Take 5 mLs (500,000 Units total) by mouth 4 (four) times daily. 60 mL 0  . ondansetron (ZOFRAN) 8 MG tablet TAKE 1 TABLET EVERY 8 HOURS AS NEEDED FOR NAUSEA AND VOMITING (START 3 DAYS AFTER CHEMOTHERAPY) 40 tablet 0  . polyethylene glycol (MIRALAX / GLYCOLAX) packet Take 17 g by mouth daily.    . prochlorperazine (COMPAZINE) 10 MG tablet TAKE 1 TABLET EVERY 6 HOURS AS NEEDED FOR NAUSEA OR VOMITING. 30 tablet 0  . Water For Irrigation, Sterile (STERILE WATER FOR IRRIGATION) Irrigate with 1,000 mLs as directed once. (Patient not taking: Reported on 02/13/2016) 1000 mL 0   No current facility-administered medications for this visit.       Marland Kitchen  PHYSICAL EXAMINATION: ECOG PERFORMANCE STATUS: 1 - Symptomatic but completely ambulatory  Vitals:   02/13/16 0938  BP: 133/80  Pulse: 100  Resp: 18  Temp: 97.9 F (36.6 C)   Filed Weights   02/13/16 0938  Weight: 71 lb 9.6 oz (32.5 kg)    GENERAL: Thin built moderately nourished Caucasian female patient anxious. Alert, no distress and comfortable. She is alone.  EYES: no pallor or icterus OROPHARYNX: no thrush or ulceration; poor dentition. No obvious masses noted in the oral cavity.Thick secretions noted NECK: supple, 5-6cm mass[soft/cystic]- submandibular mass. Non-tender. Right neck 3x5 x3.5 mass [increasing] LN felt.  LYMPH:  no palpable lymphadenopathy xillary or inguinal regions LUNGS: Decreased breath sounds bilaterally No wheeze or crackles; barrel chested.  HEART/CVS: regular rate & rhythm and no murmurs; No lower extremity edema ABDOMEN: abdomen soft, non-tender and normal bowel  sounds Musculoskeletal:no cyanosis of digits and no clubbing  PSYCH: alert & oriented x 3 with fluent speech NEURO: no focal motor/sensory deficits SKIN:  no rashes or significant lesions  LABORATORY DATA:  I have reviewed the data as listed Lab Results  Component Value Date   WBC 10.1 02/13/2016   HGB 9.6 (L) 02/13/2016   HCT 27.2 (L) 02/13/2016   MCV 101.0 (H) 02/13/2016   PLT 255 02/13/2016    Recent Labs  01/09/16 0930 01/23/16 0957 02/13/16 0911  NA 124* 126* 127*  K 3.9 3.8 3.5  CL 90* 90* 92*  CO2 26 29 28   GLUCOSE 122* 98 158*  BUN 17 12 13   CREATININE 0.73 0.61 0.60  CALCIUM 8.7*  8.5* 8.7*  GFRNONAA >60 >60 >60  GFRAA >60 >60 >60  PROT 6.4* 6.5 6.3*  ALBUMIN 3.5 3.4* 3.3*  AST 22 17 21   ALT 14 13* 12*  ALKPHOS 62 70 75  BILITOT 0.5 0.4 0.5    ASSESSMENT & PLAN:  Cancer of floor of mouth ([facility])  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  Progressive disease on PET;  September 21st 2017. Significant clinical progression noted on after 3 cycles of Opdivo.   # Had a lengthy discussion regarding overall poor prognosis; and again all treatments are palliative. Patient very concerned about potential side effects of chemotherapy. She states that if she has any side effects she will continue treatment.  # HOLD further OPDIVO; start carbo-taxol every 3 weeks. Also discussed regarding 5-FU carboplatin-cetuximab/ extreme trial. Awaiting on Alpha-Gal testing. Discussed the potential side effects of chemotherapy at length. If continued progression/ poor tolerance to therapy- hospice would be recommended. This was also discussed. She'll reluctantly agrees to proceed with next line of therapy.   Growth factor-Neulasta/On pro would be given as prophylaxis for chemotherapy-induced neutropenia to prevent febrile neutropenias. Discussed potential side effect- myalgias/arthralgias.   # Hyponatremia- 126-improving.  Increase salt intake/ fluid intake.   # hypothyroidism recent TSH 8; on snthroid.   # Start carbotaxol next week; follow-up with me 10 days later labs.    Cammie Sickle, MD   02/13/2016 6:07 PM  .

## 2016-02-17 ENCOUNTER — Other Ambulatory Visit: Payer: Self-pay | Admitting: Internal Medicine

## 2016-02-17 DIAGNOSIS — R112 Nausea with vomiting, unspecified: Secondary | ICD-10-CM

## 2016-02-17 DIAGNOSIS — T451X5A Adverse effect of antineoplastic and immunosuppressive drugs, initial encounter: Principal | ICD-10-CM

## 2016-02-18 ENCOUNTER — Inpatient Hospital Stay: Payer: Commercial Managed Care - HMO | Attending: Internal Medicine

## 2016-02-18 ENCOUNTER — Other Ambulatory Visit: Payer: Self-pay | Admitting: Internal Medicine

## 2016-02-18 VITALS — BP 133/86 | HR 93 | Temp 96.0°F | Resp 16

## 2016-02-18 DIAGNOSIS — Z7689 Persons encountering health services in other specified circumstances: Secondary | ICD-10-CM | POA: Diagnosis not present

## 2016-02-18 DIAGNOSIS — I10 Essential (primary) hypertension: Secondary | ICD-10-CM | POA: Diagnosis not present

## 2016-02-18 DIAGNOSIS — Z5111 Encounter for antineoplastic chemotherapy: Secondary | ICD-10-CM | POA: Diagnosis not present

## 2016-02-18 DIAGNOSIS — C049 Malignant neoplasm of floor of mouth, unspecified: Secondary | ICD-10-CM | POA: Insufficient documentation

## 2016-02-18 DIAGNOSIS — F17210 Nicotine dependence, cigarettes, uncomplicated: Secondary | ICD-10-CM | POA: Diagnosis not present

## 2016-02-18 DIAGNOSIS — E871 Hypo-osmolality and hyponatremia: Secondary | ICD-10-CM | POA: Diagnosis not present

## 2016-02-18 DIAGNOSIS — I739 Peripheral vascular disease, unspecified: Secondary | ICD-10-CM | POA: Insufficient documentation

## 2016-02-18 DIAGNOSIS — E785 Hyperlipidemia, unspecified: Secondary | ICD-10-CM | POA: Diagnosis not present

## 2016-02-18 DIAGNOSIS — Z931 Gastrostomy status: Secondary | ICD-10-CM | POA: Diagnosis not present

## 2016-02-18 DIAGNOSIS — Z79899 Other long term (current) drug therapy: Secondary | ICD-10-CM | POA: Insufficient documentation

## 2016-02-18 DIAGNOSIS — E039 Hypothyroidism, unspecified: Secondary | ICD-10-CM | POA: Diagnosis not present

## 2016-02-18 MED ORDER — DIPHENHYDRAMINE HCL 50 MG/ML IJ SOLN: 50.0000 mg | Freq: Once | INTRAMUSCULAR | Status: DC

## 2016-02-18 MED ORDER — SODIUM CHLORIDE 0.9 % IV SOLN
20.0000 mg | Freq: Once | INTRAVENOUS | Status: AC
  Administered 2016-02-18: 20 mg via INTRAVENOUS
  Filled 2016-02-18: qty 2

## 2016-02-18 MED ORDER — HEPARIN SOD (PORK) LOCK FLUSH 100 UNIT/ML IV SOLN
INTRAVENOUS | Status: AC
  Filled 2016-02-18: qty 5

## 2016-02-18 MED ORDER — PALONOSETRON HCL INJECTION 0.25 MG/5ML
0.2500 mg | Freq: Once | INTRAVENOUS | Status: AC
  Administered 2016-02-18: 0.25 mg via INTRAVENOUS
  Filled 2016-02-18: qty 5

## 2016-02-18 MED ORDER — PEGFILGRASTIM 6 MG/0.6ML ~~LOC~~ PSKT
6.0000 mg | PREFILLED_SYRINGE | Freq: Once | SUBCUTANEOUS | Status: AC
  Administered 2016-02-18: 6 mg via SUBCUTANEOUS
  Filled 2016-02-18: qty 0.6

## 2016-02-18 MED ORDER — FAMOTIDINE IN NACL 20-0.9 MG/50ML-% IV SOLN
20.0000 mg | Freq: Once | INTRAVENOUS | Status: AC
  Administered 2016-02-18: 20 mg via INTRAVENOUS
  Filled 2016-02-18: qty 50

## 2016-02-18 MED ORDER — HEPARIN SOD (PORK) LOCK FLUSH 100 UNIT/ML IV SOLN
500.0000 [IU] | Freq: Once | INTRAVENOUS | Status: AC
Start: 2016-02-18 — End: 2016-02-18
  Administered 2016-02-18: 500 [IU] via INTRAVENOUS

## 2016-02-18 MED ORDER — SODIUM CHLORIDE 0.9 % IV SOLN
225.0000 mg/m2 | Freq: Once | INTRAVENOUS | Status: AC
  Administered 2016-02-18: 264 mg via INTRAVENOUS
  Filled 2016-02-18: qty 44

## 2016-02-18 MED ORDER — SODIUM CHLORIDE 0.9 % IV SOLN
Freq: Once | INTRAVENOUS | Status: AC
  Administered 2016-02-18: 10:00:00 via INTRAVENOUS
  Filled 2016-02-18: qty 1000

## 2016-02-18 MED ORDER — SODIUM CHLORIDE 0.9 % IV SOLN
208.8000 mg | Freq: Once | INTRAVENOUS | Status: AC
  Administered 2016-02-18: 210 mg via INTRAVENOUS
  Filled 2016-02-18: qty 21

## 2016-02-18 MED ORDER — SODIUM CHLORIDE 0.9 % IJ SOLN
10.0000 mL | Freq: Once | INTRAMUSCULAR | Status: AC
  Administered 2016-02-18: 10 mL via INTRAVENOUS
  Filled 2016-02-18: qty 10

## 2016-02-18 MED ORDER — DIPHENHYDRAMINE HCL 50 MG/ML IJ SOLN
25.0000 mg | Freq: Once | INTRAMUSCULAR | Status: AC
  Administered 2016-02-18: 25 mg via INTRAVENOUS
  Filled 2016-02-18: qty 1

## 2016-02-19 ENCOUNTER — Telehealth: Payer: Self-pay | Admitting: *Deleted

## 2016-02-19 ENCOUNTER — Other Ambulatory Visit: Payer: Self-pay | Admitting: *Deleted

## 2016-02-19 DIAGNOSIS — T451X5A Adverse effect of antineoplastic and immunosuppressive drugs, initial encounter: Principal | ICD-10-CM

## 2016-02-19 DIAGNOSIS — R112 Nausea with vomiting, unspecified: Secondary | ICD-10-CM

## 2016-02-19 MED ORDER — PROCHLORPERAZINE MALEATE 10 MG PO TABS: ORAL_TABLET | ORAL | 0 refills | Status: AC

## 2016-02-19 NOTE — Telephone Encounter (Signed)
Contacted John in main lab x [phone] to determine status on alpha gal testing. John will review chart in epic and call RN back.

## 2016-02-19 NOTE — Telephone Encounter (Signed)
Per Jenny Reichmann- test was not drawn.  MD made aware. V/o obtained to add to labs drawn on 02/28/16 date.

## 2016-02-20 DIAGNOSIS — C029 Malignant neoplasm of tongue, unspecified: Secondary | ICD-10-CM | POA: Diagnosis not present

## 2016-02-20 DIAGNOSIS — R634 Abnormal weight loss: Secondary | ICD-10-CM | POA: Diagnosis not present

## 2016-02-26 ENCOUNTER — Inpatient Hospital Stay: Payer: Commercial Managed Care - HMO

## 2016-02-26 ENCOUNTER — Ambulatory Visit: Payer: Commercial Managed Care - HMO | Admitting: Radiation Oncology

## 2016-02-27 ENCOUNTER — Ambulatory Visit: Payer: Commercial Managed Care - HMO

## 2016-02-28 ENCOUNTER — Inpatient Hospital Stay: Payer: Commercial Managed Care - HMO

## 2016-02-28 ENCOUNTER — Inpatient Hospital Stay (HOSPITAL_BASED_OUTPATIENT_CLINIC_OR_DEPARTMENT_OTHER): Payer: Commercial Managed Care - HMO | Admitting: Internal Medicine

## 2016-02-28 VITALS — BP 126/74 | HR 66 | Temp 95.2°F | Wt 71.4 lb

## 2016-02-28 DIAGNOSIS — Z7689 Persons encountering health services in other specified circumstances: Secondary | ICD-10-CM | POA: Diagnosis not present

## 2016-02-28 DIAGNOSIS — F17210 Nicotine dependence, cigarettes, uncomplicated: Secondary | ICD-10-CM

## 2016-02-28 DIAGNOSIS — C049 Malignant neoplasm of floor of mouth, unspecified: Secondary | ICD-10-CM | POA: Diagnosis not present

## 2016-02-28 DIAGNOSIS — I10 Essential (primary) hypertension: Secondary | ICD-10-CM | POA: Diagnosis not present

## 2016-02-28 DIAGNOSIS — Z79899 Other long term (current) drug therapy: Secondary | ICD-10-CM

## 2016-02-28 DIAGNOSIS — Z5111 Encounter for antineoplastic chemotherapy: Secondary | ICD-10-CM | POA: Diagnosis not present

## 2016-02-28 DIAGNOSIS — E039 Hypothyroidism, unspecified: Secondary | ICD-10-CM

## 2016-02-28 DIAGNOSIS — E871 Hypo-osmolality and hyponatremia: Secondary | ICD-10-CM | POA: Diagnosis not present

## 2016-02-28 DIAGNOSIS — Z931 Gastrostomy status: Secondary | ICD-10-CM | POA: Diagnosis not present

## 2016-02-28 LAB — CBC WITH DIFFERENTIAL/PLATELET
BASOS ABS: 0 10*3/uL (ref 0–0.1)
Basophils Relative: 0 %
EOS ABS: 0 10*3/uL (ref 0–0.7)
Eosinophils Relative: 0 %
HCT: 23.8 % — ABNORMAL LOW (ref 35.0–47.0)
HEMOGLOBIN: 8.1 g/dL — AB (ref 12.0–16.0)
LYMPHS PCT: 3 %
Lymphs Abs: 0.6 10*3/uL — ABNORMAL LOW (ref 1.0–3.6)
MCH: 34.8 pg — ABNORMAL HIGH (ref 26.0–34.0)
MCHC: 34 g/dL (ref 32.0–36.0)
MCV: 102.2 fL — ABNORMAL HIGH (ref 80.0–100.0)
MONO ABS: 1 10*3/uL — AB (ref 0.2–0.9)
Monocytes Relative: 5 %
NEUTROS PCT: 92 %
Neutro Abs: 17.8 10*3/uL — ABNORMAL HIGH (ref 1.4–6.5)
PLATELETS: 123 10*3/uL — AB (ref 150–440)
RBC: 2.33 MIL/uL — ABNORMAL LOW (ref 3.80–5.20)
RDW: 14.1 % (ref 11.5–14.5)
WBC: 19.4 10*3/uL — AB (ref 3.6–11.0)

## 2016-02-28 LAB — MAGNESIUM: Magnesium: 1.9 mg/dL (ref 1.7–2.4)

## 2016-02-28 LAB — BASIC METABOLIC PANEL
Anion gap: 8 (ref 5–15)
BUN: 12 mg/dL (ref 6–20)
CHLORIDE: 90 mmol/L — AB (ref 101–111)
CO2: 29 mmol/L (ref 22–32)
CREATININE: 0.49 mg/dL (ref 0.44–1.00)
Calcium: 8.7 mg/dL — ABNORMAL LOW (ref 8.9–10.3)
Glucose, Bld: 110 mg/dL — ABNORMAL HIGH (ref 65–99)
POTASSIUM: 4.4 mmol/L (ref 3.5–5.1)
SODIUM: 127 mmol/L — AB (ref 135–145)

## 2016-02-28 LAB — SAMPLE TO BLOOD BANK

## 2016-02-28 MED ORDER — MAGIC MOUTHWASH W/LIDOCAINE
5.0000 mL | Freq: Four times a day (QID) | ORAL | 6 refills | Status: AC
Start: 2016-02-28 — End: ?

## 2016-02-28 MED ORDER — MORPHINE SULFATE (CONCENTRATE) 10 MG /0.5 ML PO SOLN: 5.0000 mg | Freq: Four times a day (QID) | ORAL | 0 refills | Status: AC | PRN

## 2016-02-28 NOTE — Progress Notes (Signed)
Patient here today for follow up on Cancer of floor of mouth.  Patient did not tolerate last treatment well, Patient continues to have no energy, very fatigued, Patient states "its no worth it", referring to the last treatment.

## 2016-02-28 NOTE — Progress Notes (Signed)
[facility] NOTE  Patient Care Team: Tracie Harrier, MD as PCP - General (Internal Medicine) Noreene Filbert, MD as Referring Physician (Radiation Oncology) Algernon Huxley, MD as Referring Physician (Vascular Surgery)  CHIEF COMPLAINTS/PURPOSE OF CONSULTATION:   Oncology History   # April 2017-SCC [STAGE IVA T4N2; Dr.McQueen; Left floor of mouth -Bx; Left neck LN-FNA+] left tongue/ floor of mouth- 4 x 3 x 2 cm.; 4.3 x 3.3 x 3 cm complex mass extends through the left mylohyoid muscle below the left mandible anterior to left submandibular gland] - START Cis [5/15]- weekly with RT [5/17]  # SEP 21st 2017- PET progression; OCT 2017- NIVO every 2 w x3;   # NOV 30th- PROGRESSION - recommend carbo-taxol q 3 W  # s/p PEG [September 06 2015]  # Smoker # weight loss     Cancer of floor of mouth ([place])   07/10/2015 Initial Diagnosis    Cancer of floor of mouth ([place])       HISTORY OF PRESENTING ILLNESS:  Rebecca Mcmillan 69 y.o.  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  After first cycle of chemotherapy patient noted to have significant fatigue "could not function" for almost a week. She feels poorly. Poor appetite.   Complains of significant fatigue. Most of the nutrition is through PEG tube. No headaches or vision changes.  Patient noted to have some improvement of the left neck "lump". However notes to have swelling lateral aspect of the left neck; and also worsening on the right side of the neck. She has mild pain; she has been taking Tylenol for pain. This is not helping. Patient is reluctant in proceeding with further chemotherapy. She is extremely apprehensive about the potential side effects.  ROS: A complete 10 point review of system is done which is negative except mentioned above in history of present illness  MEDICAL  HISTORY:  Past Medical History:  Diagnosis Date  . Dysphagia   . G tube feedings ([place])   . Hyperlipidemia   . Hypertension   . Hypokalemia   . Hypothyroidism   . Malnourished ([place])   . Metastatic cancer ([place])   . 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 Peripheral vascular disease ([place])   . Thyroid disease     SURGICAL HISTORY: None  SOCIAL HISTORY: Patient previously used to work in nursing homes. Denies any alcohol. Smokes a pack of Celexa day for 45 years lives with her husband in [place]. No children.  Social History   Social History  . Marital status: Married    Spouse name: N/A  . Number of children: N/A  . Years of education: N/A   Occupational History  . Not on file.   Social History Main Topics  . Smoking status: Former Smoker    Packs/day: 1.00    Years: 45.00    Types: Cigarettes    Quit date: 06/19/2015  . Smokeless tobacco: Never Used  . Alcohol use No  . Drug use: No  . Sexual activity: Not on file   Other Topics Concern  . Not on file   Social History Narrative  . No narrative on file    FAMILY HISTORY:no family history of head and neck cancer.   ALLERGIES:  is allergic to garlic; lactose intolerance (gi); and tetracyclines & related.  MEDICATIONS:  Current  Outpatient Prescriptions  Medication Sig Dispense Refill  . atenolol (TENORMIN) 25 MG tablet Take 25 mg by mouth daily.     Marland Kitchen docusate sodium (COLACE) 100 MG capsule Take 100 mg by mouth 2 (two) times daily.    . Gauze Pads & Dressings (DERMACEA DRAIN SPONGES) 4"X4" PADS 1 each by Does not apply route 1 day or 1 dose. 50 each 0  . Irrigation Supplies (PISTON IRRIGATION SYRINGE) MISC 1 Syringe by Does not apply route 1 day or 1 dose. 30 each 0  . levothyroxine (SYNTHROID, LEVOTHROID) 25 MCG tablet Take 1 tablet (25 mcg total) by mouth daily before breakfast. 30 tablet 3  . levothyroxine (SYNTHROID, LEVOTHROID) 50 MCG tablet Take 50 mcg by mouth daily before breakfast.     . lidocaine-prilocaine  (EMLA) cream Apply 1 application topically as needed. 30 g 6  . losartan (COZAAR) 100 MG tablet Take 100 mg by mouth daily.     . magic mouthwash w/lidocaine SOLN Take 5 mLs by mouth 4 (four) times daily. 80 ml viscous lidocaine 2%, 80 ml Mylanta, 80 ml Diphenhydramine 12.5 mg/5 ml Elixir, 80 ml Nystatin 100,000 Unit suspension, 80 ml Prednisolone 15 mg/63ml, 80 ml Distilled Water. Sig: Swish/Swallow 5-10 ml four times a day as needed. Dispense 480 ml. 3RFs 480 mL 6  . Nutritional Supplements (FEEDING SUPPLEMENT, JEVITY 1.5 CAL,) LIQD Place 1,000 mLs into feeding tube continuous. 4 to 5 cans via tube feed    . ondansetron (ZOFRAN) 8 MG tablet TAKE 1 TABLET EVERY 8 HOURS AS NEEDED FOR NAUSEA AND VOMITING (START 3 DAYS AFTER CHEMOTHERAPY) 40 tablet 0  . polyethylene glycol (MIRALAX / GLYCOLAX) packet Take 17 g by mouth daily.    . prochlorperazine (COMPAZINE) 10 MG tablet TAKE 1 TABLET EVERY 6 HOURS AS NEEDED FOR NAUSEA OR VOMITING 30 tablet 0  . Water For Irrigation, Sterile (STERILE WATER FOR IRRIGATION) Irrigate with 1,000 mLs as directed once. 1000 mL 0  . Morphine Sulfate (MORPHINE CONCENTRATE) 10 mg / 0.5 ml concentrated solution Take 0.25 mLs (5 mg total) by mouth every 6 (six) hours as needed for moderate pain or severe pain. 30 mL 0   No current facility-administered medications for this visit.       Marland Kitchen  PHYSICAL EXAMINATION: ECOG PERFORMANCE STATUS: 1 - Symptomatic but completely ambulatory  Vitals:   02/28/16 1033  BP: 126/74  Pulse: 66  Temp: (!) 95.2 F (35.1 C)   Filed Weights   02/28/16 1033  Weight: 71 lb 6 oz (32.4 kg)    GENERAL: Thin built moderately nourished Caucasian female patient anxious. Alert, no distress and comfortable. Accompanied by her husband. EYES: no pallor or icterus OROPHARYNX: no thrush or ulceration; poor dentition. No obvious masses noted in the oral cavity.Thick secretions noted NECK: suppl worsening of the left neck adenopathy.  Non-tender.  Right neck 3x5 x3.5 mass [increasing] LN felt.  LYMPH:  no palpable lymphadenopathy xillary or inguinal regions LUNGS: Decreased breath sounds bilaterally No wheeze or crackles; barrel chested.  HEART/CVS: regular rate & rhythm and no murmurs; No lower extremity edema ABDOMEN: abdomen soft, non-tender and normal bowel sounds Musculoskeletal:no cyanosis of digits and no clubbing  PSYCH: alert & oriented x 3.  NEURO: no focal motor/sensory deficits SKIN:  no rashes or significant lesions  LABORATORY DATA:  I have reviewed the data as listed Lab Results  Component Value Date   WBC 19.4 (H) 02/28/2016   HGB 8.1 (L) 02/28/2016   HCT 23.8 (L)  02/28/2016   MCV 102.2 (H) 02/28/2016   PLT 123 (L) 02/28/2016    Recent Labs  01/09/16 0930 01/23/16 0957 02/13/16 0911 02/28/16 0952  NA 124* 126* 127* 127*  K 3.9 3.8 3.5 4.4  CL 90* 90* 92* 90*  CO2 26 29 28 29   GLUCOSE 122* 98 158* 110*  BUN 17 12 13 12   CREATININE 0.73 0.61 0.60 0.49  CALCIUM 8.7* 8.5* 8.7* 8.7*  GFRNONAA >60 >60 >60 >60  GFRAA >60 >60 >60 >60  PROT 6.4* 6.5 6.3*  --   ALBUMIN 3.5 3.4* 3.3*  --   AST 22 17 21   --   ALT 14 13* 12*  --   ALKPHOS 62 70 75  --   BILITOT 0.5 0.4 0.5  --     ASSESSMENT & PLAN:  Cancer of floor of mouth ([facility])  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 On palliative carbo-taxol s/p cycle #1 10 days ago. No significant clinical response noted.   # HOLD Chemo on 26th as planned; sec to pt wishes/ poor tolerance.  Discussed that if her disease  improves; and if clinically she feels better in the next few weeks I might consider using chemotherapy again [carbotaxol weekly versus 5-FU-cetux; Alpha-gal pending]. Patient at this time is very reluctant on further chemotherapy. Discussed re: hospice. No decisions made. We will make referral to hospice- to explore the options.  # Hyponatremia- 126-stable. Increase salt intake/ fluid  intake.   # Pain- recommend morphine liquid prn. Prescription given.  # hypothyroidism recent TSH 8; on snthroid.   # Revaluate in 1 st week of Jan 2018; labs/ no chemo. Also recommend hospice referral- patient/husband agrees.   Cammie Sickle, MD   02/29/2016 11:55 AM  .

## 2016-02-28 NOTE — Assessment & Plan Note (Addendum)
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 On palliative carbo-taxol s/p cycle #1 10 days ago. No significant clinical response noted.   # HOLD Chemo on 26th as planned; sec to pt wishes/ poor tolerance.  Discussed that if her disease  improves; and if clinically she feels better in the next few weeks I might consider using chemotherapy again [carbotaxol weekly versus 5-FU-cetux; Alpha-gal pending]. Patient at this time is very reluctant on further chemotherapy. Discussed re: hospice. No decisions made. We will make referral to hospice- to explore the options.  # Hyponatremia- 126-stable. Increase salt intake/ fluid intake.   # Pain- recommend morphine liquid prn. Prescription given.  # hypothyroidism recent TSH 8; on snthroid.   # Revaluate in 1 st week of Jan 2018; labs/ no chemo. Also recommend hospice referral- patient/husband agrees.

## 2016-03-02 LAB — MISC LABCORP TEST (SEND OUT)
LabCorp test name: 806562
Labcorp test code: 806562

## 2016-03-06 ENCOUNTER — Inpatient Hospital Stay
Admission: EM | Admit: 2016-03-06 | Discharge: 2016-03-08 | DRG: 194 | Disposition: A | Discharge status: Home / Self Care | Payer: Commercial Managed Care - HMO | Attending: Internal Medicine | Admitting: Internal Medicine

## 2016-03-06 DIAGNOSIS — Z91011 Allergy to milk products: Secondary | ICD-10-CM

## 2016-03-06 DIAGNOSIS — J440 Chronic obstructive pulmonary disease with acute lower respiratory infection: Secondary | ICD-10-CM | POA: Diagnosis present

## 2016-03-06 DIAGNOSIS — Z85810 Personal history of malignant neoplasm of tongue: Secondary | ICD-10-CM | POA: Diagnosis not present

## 2016-03-06 DIAGNOSIS — Z881 Allergy status to other antibiotic agents status: Secondary | ICD-10-CM

## 2016-03-06 DIAGNOSIS — E039 Hypothyroidism, unspecified: Secondary | ICD-10-CM | POA: Diagnosis present

## 2016-03-06 DIAGNOSIS — I739 Peripheral vascular disease, unspecified: Secondary | ICD-10-CM | POA: Diagnosis present

## 2016-03-06 DIAGNOSIS — Z91018 Allergy to other foods: Secondary | ICD-10-CM

## 2016-03-06 DIAGNOSIS — R64 Cachexia: Secondary | ICD-10-CM | POA: Diagnosis not present

## 2016-03-06 DIAGNOSIS — R1310 Dysphagia, unspecified: Secondary | ICD-10-CM | POA: Diagnosis present

## 2016-03-06 DIAGNOSIS — Z681 Body mass index (BMI) 19 or less, adult: Secondary | ICD-10-CM | POA: Diagnosis not present

## 2016-03-06 DIAGNOSIS — Z87891 Personal history of nicotine dependence: Secondary | ICD-10-CM | POA: Diagnosis not present

## 2016-03-06 DIAGNOSIS — Z931 Gastrostomy status: Secondary | ICD-10-CM

## 2016-03-06 DIAGNOSIS — Z66 Do not resuscitate: Secondary | ICD-10-CM | POA: Diagnosis not present

## 2016-03-06 DIAGNOSIS — C029 Malignant neoplasm of tongue, unspecified: Secondary | ICD-10-CM | POA: Diagnosis present

## 2016-03-06 DIAGNOSIS — I10 Essential (primary) hypertension: Secondary | ICD-10-CM | POA: Diagnosis present

## 2016-03-06 DIAGNOSIS — Z79899 Other long term (current) drug therapy: Secondary | ICD-10-CM

## 2016-03-06 DIAGNOSIS — D72829 Elevated white blood cell count, unspecified: Secondary | ICD-10-CM | POA: Diagnosis not present

## 2016-03-06 DIAGNOSIS — E785 Hyperlipidemia, unspecified: Secondary | ICD-10-CM | POA: Diagnosis not present

## 2016-03-06 DIAGNOSIS — J189 Pneumonia, unspecified organism: Secondary | ICD-10-CM | POA: Diagnosis not present

## 2016-03-06 DIAGNOSIS — R0602 Shortness of breath: Secondary | ICD-10-CM | POA: Diagnosis not present

## 2016-03-06 DIAGNOSIS — J181 Lobar pneumonia, unspecified organism: Secondary | ICD-10-CM

## 2016-03-06 DIAGNOSIS — C760 Malignant neoplasm of head, face and neck: Secondary | ICD-10-CM | POA: Diagnosis not present

## 2016-03-06 DIAGNOSIS — R0600 Dyspnea, unspecified: Secondary | ICD-10-CM | POA: Diagnosis not present

## 2016-03-07 ENCOUNTER — Emergency Department: Payer: Commercial Managed Care - HMO

## 2016-03-07 ENCOUNTER — Other Ambulatory Visit: Payer: Self-pay

## 2016-03-07 ENCOUNTER — Encounter: Payer: Self-pay | Admitting: Emergency Medicine

## 2016-03-07 DIAGNOSIS — E785 Hyperlipidemia, unspecified: Secondary | ICD-10-CM | POA: Diagnosis present

## 2016-03-07 DIAGNOSIS — J189 Pneumonia, unspecified organism: Secondary | ICD-10-CM | POA: Diagnosis present

## 2016-03-07 DIAGNOSIS — Z681 Body mass index (BMI) 19 or less, adult: Secondary | ICD-10-CM | POA: Diagnosis not present

## 2016-03-07 DIAGNOSIS — J181 Lobar pneumonia, unspecified organism: Secondary | ICD-10-CM | POA: Diagnosis present

## 2016-03-07 DIAGNOSIS — Z91011 Allergy to milk products: Secondary | ICD-10-CM | POA: Diagnosis not present

## 2016-03-07 DIAGNOSIS — C029 Malignant neoplasm of tongue, unspecified: Secondary | ICD-10-CM | POA: Diagnosis present

## 2016-03-07 DIAGNOSIS — Z881 Allergy status to other antibiotic agents status: Secondary | ICD-10-CM | POA: Diagnosis not present

## 2016-03-07 DIAGNOSIS — R1310 Dysphagia, unspecified: Secondary | ICD-10-CM | POA: Diagnosis present

## 2016-03-07 DIAGNOSIS — Z79899 Other long term (current) drug therapy: Secondary | ICD-10-CM | POA: Diagnosis not present

## 2016-03-07 DIAGNOSIS — I10 Essential (primary) hypertension: Secondary | ICD-10-CM | POA: Diagnosis present

## 2016-03-07 DIAGNOSIS — J440 Chronic obstructive pulmonary disease with acute lower respiratory infection: Secondary | ICD-10-CM | POA: Diagnosis present

## 2016-03-07 DIAGNOSIS — Z85810 Personal history of malignant neoplasm of tongue: Secondary | ICD-10-CM | POA: Diagnosis not present

## 2016-03-07 DIAGNOSIS — Z87891 Personal history of nicotine dependence: Secondary | ICD-10-CM | POA: Diagnosis not present

## 2016-03-07 DIAGNOSIS — Z66 Do not resuscitate: Secondary | ICD-10-CM | POA: Diagnosis present

## 2016-03-07 DIAGNOSIS — I739 Peripheral vascular disease, unspecified: Secondary | ICD-10-CM | POA: Diagnosis present

## 2016-03-07 DIAGNOSIS — R64 Cachexia: Secondary | ICD-10-CM | POA: Diagnosis present

## 2016-03-07 DIAGNOSIS — E039 Hypothyroidism, unspecified: Secondary | ICD-10-CM | POA: Diagnosis present

## 2016-03-07 DIAGNOSIS — Z91018 Allergy to other foods: Secondary | ICD-10-CM | POA: Diagnosis not present

## 2016-03-07 DIAGNOSIS — Z931 Gastrostomy status: Secondary | ICD-10-CM | POA: Diagnosis not present

## 2016-03-07 LAB — CBC WITH DIFFERENTIAL/PLATELET
BASOS ABS: 0 10*3/uL (ref 0–0.1)
BASOS PCT: 0 %
Eosinophils Absolute: 0 10*3/uL (ref 0–0.7)
Eosinophils Relative: 0 %
HEMATOCRIT: 29 % — AB (ref 35.0–47.0)
HEMOGLOBIN: 10.1 g/dL — AB (ref 12.0–16.0)
Lymphocytes Relative: 4 %
Lymphs Abs: 0.5 10*3/uL — ABNORMAL LOW (ref 1.0–3.6)
MCH: 35.9 pg — ABNORMAL HIGH (ref 26.0–34.0)
MCHC: 35 g/dL (ref 32.0–36.0)
MCV: 102.6 fL — ABNORMAL HIGH (ref 80.0–100.0)
Monocytes Absolute: 0.3 10*3/uL (ref 0.2–0.9)
Monocytes Relative: 3 %
NEUTROS ABS: 12.7 10*3/uL — AB (ref 1.4–6.5)
NEUTROS PCT: 93 %
Platelets: 304 10*3/uL (ref 150–440)
RBC: 2.83 MIL/uL — ABNORMAL LOW (ref 3.80–5.20)
RDW: 14.8 % — ABNORMAL HIGH (ref 11.5–14.5)
WBC: 13.6 10*3/uL — ABNORMAL HIGH (ref 3.6–11.0)

## 2016-03-07 LAB — BASIC METABOLIC PANEL
ANION GAP: 13 (ref 5–15)
BUN: 18 mg/dL (ref 6–20)
CHLORIDE: 89 mmol/L — AB (ref 101–111)
CO2: 25 mmol/L (ref 22–32)
Calcium: 9.2 mg/dL (ref 8.9–10.3)
Creatinine, Ser: 0.62 mg/dL (ref 0.44–1.00)
GFR calc non Af Amer: >60 mL/min (ref >60)
Glucose, Bld: 94 mg/dL (ref 65–99)
Potassium: 4.2 mmol/L (ref 3.5–5.1)
Sodium: 127 mmol/L — ABNORMAL LOW (ref 135–145)

## 2016-03-07 LAB — FIBRIN DERIVATIVES D-DIMER (ARMC ONLY): FIBRIN DERIVATIVES D-DIMER (ARMC): 1525 — AB (ref 0–499)

## 2016-03-07 LAB — BRAIN NATRIURETIC PEPTIDE: B Natriuretic Peptide: 131 pg/mL — ABNORMAL HIGH (ref 0.0–100.0)

## 2016-03-07 LAB — TROPONIN I

## 2016-03-07 MED ORDER — LOSARTAN POTASSIUM 50 MG PO TABS
100.0000 mg | ORAL_TABLET | Freq: Every day | ORAL | Status: DC
  Filled 2016-03-07: qty 2

## 2016-03-07 MED ORDER — LEVOTHYROXINE SODIUM 50 MCG PO TABS
50.0000 ug | ORAL_TABLET | Freq: Every day | ORAL | Status: DC
  Administered 2016-03-07: 08:00:00 50 ug via ORAL
  Filled 2016-03-07: qty 1

## 2016-03-07 MED ORDER — DEXTROSE 5 % IV SOLN
500.0000 mg | INTRAVENOUS | Status: DC
  Administered 2016-03-07: 22:00:00 500 mg via INTRAVENOUS
  Filled 2016-03-07 (×2): qty 500

## 2016-03-07 MED ORDER — ACETAMINOPHEN 325 MG PO TABS: 650.0000 mg | ORAL_TABLET | Freq: Four times a day (QID) | ORAL | Status: DC | PRN

## 2016-03-07 MED ORDER — LEVOFLOXACIN IN D5W 750 MG/150ML IV SOLN
750.0000 mg | Freq: Once | INTRAVENOUS | Status: AC
  Administered 2016-03-07: 750 mg via INTRAVENOUS
  Filled 2016-03-07: qty 150

## 2016-03-07 MED ORDER — ENOXAPARIN SODIUM 30 MG/0.3ML ~~LOC~~ SOLN
30.0000 mg | SUBCUTANEOUS | Status: DC
  Administered 2016-03-07 – 2016-03-08 (×2): 30 mg via SUBCUTANEOUS
  Filled 2016-03-07 (×2): qty 0.3

## 2016-03-07 MED ORDER — SODIUM CHLORIDE 0.9 % IV SOLN
INTRAVENOUS | Status: DC
  Administered 2016-03-07 – 2016-03-08 (×2): via INTRAVENOUS

## 2016-03-07 MED ORDER — LEVOTHYROXINE SODIUM 25 MCG PO TABS
25.0000 ug | ORAL_TABLET | Freq: Every day | ORAL | Status: DC
  Administered 2016-03-07: 08:00:00 25 ug via ORAL
  Filled 2016-03-07: qty 1

## 2016-03-07 MED ORDER — ACETAMINOPHEN 650 MG RE SUPP: 650.0000 mg | Freq: Four times a day (QID) | RECTAL | Status: DC | PRN

## 2016-03-07 MED ORDER — PISTON IRRIGATION SYRINGE MISC
1.0000 | Status: DC
  Administered 2016-03-07 – 2016-03-08 (×2): 1

## 2016-03-07 MED ORDER — IOPAMIDOL (ISOVUE-370) INJECTION 76%
60.0000 mL | Freq: Once | INTRAVENOUS | Status: AC | PRN
  Administered 2016-03-07: 60 mL via INTRAVENOUS

## 2016-03-07 MED ORDER — OXYCODONE-ACETAMINOPHEN 5-325 MG PO TABS: 2.0000 | ORAL_TABLET | Freq: Four times a day (QID) | ORAL | Status: DC | PRN

## 2016-03-07 MED ORDER — DEXTROSE 5 % IV SOLN: 1.0000 g | INTRAVENOUS | Status: DC

## 2016-03-07 MED ORDER — FREE WATER
60.0000 mL | Freq: Four times a day (QID) | Status: DC
  Administered 2016-03-07 – 2016-03-08 (×3): 60 mL

## 2016-03-07 MED ORDER — JEVITY 1.5 CAL PO LIQD
1000.0000 mL | ORAL | Status: DC
  Administered 2016-03-07: 237 mL

## 2016-03-07 MED ORDER — PROCHLORPERAZINE MALEATE 10 MG PO TABS: 5.0000 mg | ORAL_TABLET | Freq: Four times a day (QID) | ORAL | Status: DC | PRN

## 2016-03-07 MED ORDER — LIDOCAINE VISCOUS 2 % MT SOLN
15.0000 mL | Freq: Four times a day (QID) | OROMUCOSAL | Status: DC
  Administered 2016-03-07 – 2016-03-08 (×2): 15 mL via OROMUCOSAL
  Filled 2016-03-07 (×8): qty 15

## 2016-03-07 MED ORDER — LORAZEPAM 2 MG/ML IJ SOLN
0.5000 mg | Freq: Once | INTRAMUSCULAR | Status: AC
  Administered 2016-03-07: 0.5 mg via INTRAVENOUS
  Filled 2016-03-07: qty 1

## 2016-03-07 MED ORDER — POLYETHYLENE GLYCOL 3350 17 G PO PACK
17.0000 g | PACK | Freq: Every day | ORAL | Status: DC
Start: 2016-03-07 — End: 2016-03-08
  Filled 2016-03-07: qty 1

## 2016-03-07 MED ORDER — MORPHINE SULFATE (CONCENTRATE) 10 MG/0.5ML PO SOLN: 5.0000 mg | Freq: Four times a day (QID) | ORAL | Status: DC | PRN

## 2016-03-07 MED ORDER — "DERMACEA DRAIN SPONGES 4""X4"" PADS"
1.0000 | MEDICATED_PAD | Status: DC
  Administered 2016-03-07: 07:00:00 1

## 2016-03-07 MED ORDER — STERILE WATER FOR IRRIGATION IR SOLN
1000.0000 mL | Freq: Once | Status: AC
  Administered 2016-03-07: 11:00:00 1000 mL

## 2016-03-07 MED ORDER — JEVITY 1.5 CAL PO LIQD
237.0000 mL | Freq: Four times a day (QID) | ORAL | Status: DC
  Administered 2016-03-07 – 2016-03-08 (×3): 237 mL

## 2016-03-07 MED ORDER — SUCRALFATE 1 GM/10ML PO SUSP
1.0000 g | Freq: Three times a day (TID) | ORAL | Status: DC
  Administered 2016-03-07 – 2016-03-08 (×3): 1 g via ORAL
  Filled 2016-03-07 (×4): qty 10

## 2016-03-07 MED ORDER — ONDANSETRON HCL 4 MG/2ML IJ SOLN: 4.0000 mg | Freq: Four times a day (QID) | INTRAMUSCULAR | Status: DC | PRN

## 2016-03-07 MED ORDER — DOCUSATE SODIUM 100 MG PO CAPS
100.0000 mg | ORAL_CAPSULE | Freq: Two times a day (BID) | ORAL | Status: DC
  Filled 2016-03-07 (×2): qty 1

## 2016-03-07 MED ORDER — ONDANSETRON HCL 4 MG PO TABS: 4.0000 mg | ORAL_TABLET | Freq: Four times a day (QID) | ORAL | Status: DC | PRN

## 2016-03-07 MED ORDER — SENNOSIDES-DOCUSATE SODIUM 8.6-50 MG PO TABS: 1.0000 | ORAL_TABLET | Freq: Every evening | ORAL | Status: DC | PRN

## 2016-03-07 MED ORDER — ATENOLOL 25 MG PO TABS
25.0000 mg | ORAL_TABLET | Freq: Every day | ORAL | Status: DC
  Administered 2016-03-07 – 2016-03-08 (×2): 25 mg via ORAL
  Filled 2016-03-07: qty 1

## 2016-03-07 MED ORDER — MAGIC MOUTHWASH
5.0000 mL | Freq: Four times a day (QID) | ORAL | Status: DC
  Administered 2016-03-07 – 2016-03-08 (×3): 5 mL via ORAL
  Filled 2016-03-07 (×4): qty 10

## 2016-03-07 MED ORDER — CEFTRIAXONE SODIUM-DEXTROSE 1-3.74 GM-% IV SOLR
1.0000 g | INTRAVENOUS | Status: DC
  Administered 2016-03-07: 21:00:00 1 g via INTRAVENOUS
  Filled 2016-03-07: qty 50

## 2016-03-07 NOTE — ED Notes (Signed)
Patient required 2 attempts for PIV placement. The initial attempt would not allow for PIV catheter to be threaded into vein; resistance met despite normal flushing with tip of catheter in vein; able to aspirate 10cc of blood. Several attempts made to float catheter, however attempts unsuccessful. Site discontinued; dressing placed. Second PIV placed to RIGHT upper arm without difficulties. Dressing from previous site came off while this RN working on alternate site. Patient with some bruising noted to area where initial PIV attempted. Primary nurse made aware.

## 2016-03-07 NOTE — ED Provider Notes (Signed)
[facility] Emergency Department Provider Note        Time seen: ----------------------------------------- 12:01 AM on 03/07/2016 -----------------------------------------    I have reviewed the triage vital signs and the nursing notes.   HISTORY  Chief Complaint Shortness of Breath    HPI Rebecca Mcmillan is a 70 y.o. female brought the ER by EMS for shortness of breath. Patient is receiving treatment for tongue cancer currently and states she has shortness of breath that started this morning.Patient denies fevers, chills, chest pain but has had drainage from her left facial mass. She is currently under treatment for lung cancer at [facility]. She states she has not had the shortness of breath before, nothing makes it better or worse.   Past Medical History:  Diagnosis Date  . Dysphagia   . G tube feedings ([place])   . Hyperlipidemia   . Hypertension   . Hypokalemia   . Hypothyroidism   . Malnourished ([place])   . Metastatic cancer ([place])   . 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 Peripheral vascular disease ([place])   . Thyroid disease     Patient Active Problem List   Diagnosis Date Noted  . Hypomagnesemia 09/11/2015  . Hypokalemia 09/11/2015  . Thrush 08/28/2015  . Dehydration 08/28/2015  . Weight loss, abnormal 08/27/2015  . 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 HLD (hyperlipidemia) 06/18/2015  . BP (high blood pressure) 06/18/2015  . Current tobacco use 06/18/2015  . Carotid artery bruit 07/25/2014  . Acquired hypothyroidism 01/09/2014    Past Surgical History:  Procedure Laterality Date  . ABDOMINAL HYSTERECTOMY    . FINE NEEDLE ASPIRATION N/A 07/02/2015   Procedure: FINE NEEDLE ASPIRATION;  Surgeon: Beverly Gust, MD;  Location: [facility] ORS;  Service: ENT;  Laterality: N/A;  . FINGER SURGERY Right    thumb  . PERIPHERAL VASCULAR CATHETERIZATION N/A 07/15/2015   Procedure: Glori Luis Cath Insertion;  Surgeon:  Algernon Huxley, MD;  Location: [place] [facility];  Service: Cardiovascular;  Laterality: N/A;  . TONGUE BIOPSY N/A 07/02/2015   Procedure: TONGUE BIOPSY;  Surgeon: Beverly Gust, MD;  Location: [facility] ORS;  Service: ENT;  Laterality: N/A;    Allergies Garlic; Lactose intolerance (gi); and Tetracyclines & related  Social History Social History  Substance Use Topics  . Smoking status: Former Smoker    Packs/day: 1.00    Years: 45.00    Types: Cigarettes    Quit date: 06/19/2015  . Smokeless tobacco: Never Used  . Alcohol use No    Review of Systems Constitutional: Negative for fever. ENT: Positive for draining left facial wound Cardiovascular: Negative for chest pain. Respiratory: Positive for shortness of breath Gastrointestinal: Negative for abdominal pain, vomiting and diarrhea. Musculoskeletal: Negative for back pain. Skin: Negative for rash. Neurological: Negative for headaches, focal weakness or numbness.  10-point ROS otherwise negative.  ____________________________________________   PHYSICAL EXAM:  VITAL SIGNS: ED Triage Vitals [03/06/16 2358]  Enc Vitals Group     BP      Pulse      Resp      Temp      Temp src      SpO2 95 %     Weight      Height      Head Circumference      Peak Flow      Pain Score      Pain Loc      Pain Edu?  Excl. in [place]?     Constitutional: Alert and oriented. Anxious, mild distress Eyes: Conjunctivae are normal. PERRL. Normal extraocular movements. ENT   Head: Normocephalic and atraumatic.   Nose: No congestion/rhinnorhea.   Mouth/Throat: Mucous membranes are moist, obvious large left facial swelling that is perimandibular. There is some wound drainage on the inferior aspect of the wound below the jawline.   Neck: No stridor. Cardiovascular: Normal rate, regular rhythm. No murmurs, rubs, or gallops. Respiratory: Tachypnea, diminished breath sounds on the left Gastrointestinal: Soft and nontender. Normal  bowel sounds Musculoskeletal: Nontender with normal range of motion in all extremities. No lower extremity tenderness nor edema. Neurologic:  Normal speech and language. No gross focal neurologic deficits are appreciated.  Skin:  Skin is warm, dry and intact. No rash noted. Psychiatric: Anxious mood and affect ____________________________________________  EKG: Interpreted by me. Sinus rhythm rate of 95 bpm, normal PR interval, normal QRS, long QT, normal axis.  ____________________________________________  ED COURSE:  Pertinent labs & imaging results that were available during my care of the patient were reviewed by me and considered in my medical decision making (see chart for details). Clinical Course   Patient resents to ER for shortness of breath of uncertain etiology. We will assess with labs and imaging.  Procedures ____________________________________________   LABS (pertinent positives/negatives)  Labs Reviewed  CBC WITH DIFFERENTIAL/PLATELET - Abnormal; Notable for the following:       Result Value   WBC 13.6 (*)    RBC 2.83 (*)    Hemoglobin 10.1 (*)    HCT 29.0 (*)    MCV 102.6 (*)    MCH 35.9 (*)    RDW 14.8 (*)    Neutro Abs 12.7 (*)    Lymphs Abs 0.5 (*)    All other components within normal limits  BASIC METABOLIC PANEL - Abnormal; Notable for the following:    Sodium 127 (*)    Chloride 89 (*)    All other components within normal limits  BRAIN NATRIURETIC PEPTIDE - Abnormal; Notable for the following:    B Natriuretic Peptide 131.0 (*)    All other components within normal limits  FIBRIN DERIVATIVES D-DIMER ([facility] ONLY) - Abnormal; Notable for the following:    Fibrin derivatives D-dimer ([facility]) 1,525 (*)    All other components within normal limits  TROPONIN I    RADIOLOGY Images were viewed by me  Chest x-ray IMPRESSION: 1. Excellent opacification of the pulmonary vasculature. No pulmonary emboli. 2. Consolidation and mild volume loss in the  right middle lobe, suspicious for pneumonia. Smaller patchy subpleural opacity in the posterior left lower lobe may represent another focus of pneumonia. No effusions. ____________________________________________  FINAL ASSESSMENT AND PLAN  Dyspnea, Pneumonia  Plan: Patient with labs and imaging as dictated above. Patient presents to the ER with shortness of breath. CT scan confirmed pneumonia that was not seen on chest x-ray. Patient was unable to ambulate safely without oxygen. Upon standing her oxygen saturations dropped to the low 80s. She has been given IV Levaquin and need supplemental O2. I will discuss with the hospitalist for admission.   Earleen Newport, MD   Note: This dictation was prepared with Dragon dictation. Any transcriptional errors that result from this process are unintentional    Earleen Newport, MD 03/07/16 [phone]

## 2016-03-07 NOTE — Progress Notes (Signed)
[place] [facility] [place] NAME: Rebecca Mcmillan    MR#:  [id number]  DATE OF BIRTH:  10-24-1945  SUBJECTIVE:   Came in with increasing shortness of breath. Patient has history of head and neck cancer and underwent chemotherapy 2 weeks ago. She has PEG tube feeding. Denies any fever. REVIEW OF SYSTEMS:   Review of Systems  Constitutional: Negative for chills, fever and weight loss.  HENT: Negative for ear discharge, ear pain and nosebleeds.   Eyes: Negative for blurred vision, pain and discharge.  Respiratory: Positive for cough and shortness of breath. Negative for sputum production, wheezing and stridor.   Cardiovascular: Negative for chest pain, palpitations, orthopnea and PND.  Gastrointestinal: Negative for abdominal pain, diarrhea, nausea and vomiting.  Genitourinary: Negative for frequency and urgency.  Musculoskeletal: Negative for back pain and joint pain.  Neurological: Positive for weakness. Negative for sensory change, speech change and focal weakness.  Psychiatric/Behavioral: Negative for depression and hallucinations. The patient is not nervous/anxious.    Tolerating Diet: Tolerating PT:   DRUG ALLERGIES:   Allergies  Allergen Reactions  . Garlic Swelling    "Eyes swell up"  . Lactose Intolerance (Gi)   . Tetracyclines & Related     Per patient breaks out in hives given 5-6 years ago pt doesn't remember where this happened     VITALS:  Blood pressure (!) 142/80, pulse 85, temperature 97.8 F (36.6 C), temperature source Oral, resp. rate 18, height 5' (1.524 m), weight 29 kg (64 lb), SpO2 96 %.  PHYSICAL EXAMINATION:   Physical Exam  GENERAL:  70 y.o.-year-old patient lying in the bed with no acute distress. Thin cachectic EYES: Pupils equal, round, reactive to light and accommodation. No scleral icterus. Extraocular muscles intact.  HEENT: Head atraumatic, normocephalic. Oropharynx and nasopharynx clear.  Significant surgical changes over the left jaw. Minimal amount of drainage from the surgical site. Nonpulsatile. No evidence of cellulitis.  NECK:  Supple, no jugular venous distention. No thyroid enlargement, no tenderness.  LUNGS: Normal breath sounds bilaterally, no wheezing, rales, rhonchi. No use of accessory muscles of respiration.  CARDIOVASCULAR: S1, S2 normal. No murmurs, rubs, or gallops.  ABDOMEN: Soft, nontender, nondistended. Bowel sounds present. No organomegaly or mass. PEG tube plus EXTREMITIES: No cyanosis, clubbing or edema b/l.    NEUROLOGIC: Cranial nerves II through XII are intact. No focal Motor or sensory deficits b/l.   PSYCHIATRIC:  patient is alert and oriented x 3.  SKIN: No obvious rash, lesion, or ulcer.   LABORATORY PANEL:  CBC  Recent Labs Lab 03/07/16 0010  WBC 13.6*  HGB 10.1*  HCT 29.0*  PLT 304    Chemistries   Recent Labs Lab 03/07/16 0010  NA 127*  K 4.2  CL 89*  CO2 25  GLUCOSE 94  BUN 18  CREATININE 0.62  CALCIUM 9.2   Cardiac Enzymes  Recent Labs Lab 03/07/16 0010  TROPONINI <0.03   RADIOLOGY:  Ct Angio Chest Pe W And/or Wo Contrast  Result Date: 03/07/2016 CLINICAL DATA:  Dyspnea for 2 days, worsened tonight.  Hypoxia. EXAM: CT ANGIOGRAPHY CHEST WITH CONTRAST TECHNIQUE: Multidetector CT imaging of the chest was performed using the standard protocol during bolus administration of intravenous contrast. Multiplanar CT image reconstructions and MIPs were obtained to evaluate the vascular anatomy. CONTRAST:  60 mL Isovue 370 intravenous COMPARISON:  06/26/2015 FINDINGS: Cardiovascular: Excellent opacification of the pulmonary arteries to the subsegmental level. No evidence of pulmonary embolism.  Normal heart size. No pericardial effusion. Mediastinum/Nodes: No enlarged mediastinal, hilar, or axillary lymph nodes. Thyroid gland, trachea, and esophagus demonstrate no significant findings. Lungs/Pleura: There is consolidation and mild  volume loss in the central right middle lobe. There is patchy subpleural opacity in the posterior left lower lobe. This may represent multifocal pneumonia. Paraseptal emphysematous blebs are present in the upper lobes. No pleural effusions. Airways are patent and normal in caliber. Upper Abdomen: No acute abnormality. Musculoskeletal: No significant skeletal lesion. Review of the MIP images confirms the above findings. IMPRESSION: 1. Excellent opacification of the pulmonary vasculature. No pulmonary emboli. 2. Consolidation and mild volume loss in the right middle lobe, suspicious for pneumonia. Smaller patchy subpleural opacity in the posterior left lower lobe may represent another focus of pneumonia. No effusions. Electronically Signed   By: Andreas Newport M.D.   On: 03/07/2016 03:08   Dg Chest Port 1 View  Result Date: 03/07/2016 CLINICAL DATA:  Shortness of breath EXAM: PORTABLE CHEST 1 VIEW COMPARISON:  Chest radiograph 11/22/2015 FINDINGS: Right chest wall Port-A-Cath tip overlies the proximal right atrium. There is atherosclerotic calcification in the aortic arch. Cardiomediastinal contours are otherwise normal. The lungs are hyperexpanded without focal consolidation or pulmonary edema. No pleural effusion or pneumothorax. IMPRESSION: 1. COPD and aortic atherosclerosis. 2. No focal airspace disease or pulmonary edema. Electronically Signed   By: Ulyses Jarred M.D.   On: 03/07/2016 00:32   ASSESSMENT AND PLAN:  70 year old female patient with history of head and neck cancer, dysphagia, hypertension, hyperlipidemia presented to the emergency room with difficulty breathing.  1. Right lung community-acquired pneumonia -IV Rocephin and Zithromax -Follow blood cultures are negative so far -Follow white count which showed seems to be improving. Patient is afebrile.  2. 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 Patient has last chemotherapy  02/13/2016. Patient reports she was so drained out she is not going to take anymore chemotherapy according to her. -She has some drainage from the surgical site. We'll do one consult. We'll send wound culture. -Consider oncology consultation if needed  3. Hypertension -Continue home meds  4. Hyperlipidemia  5. Leukocytosis secondary to pneumonia Case discussed with Care Management/Social Worker. Management plans discussed with the patient, family and they are in agreement.  CODE STATUS: DO NOT RESUSCITATE  DVT Prophylaxis: Lovenox  TOTAL TIME TAKING CARE OF THIS PATIENT: 25 minutes.  >50% time spent on counselling and coordination of care  POSSIBLE D/C IN one DAYS, DEPENDING ON CLINICAL CONDITION.  Note: This dictation was prepared with Dragon dictation along with smaller phrase technology. Any transcriptional errors that result from this process are unintentional.  Terryn Redner M.D on 03/07/2016 at 12:43 PM  Between 7am to 6pm - Pager - [phone]  After 6pm go to [email or web address] - password [email or web address] [facility]  Office  [phone]  CC: Primary care physician; Tracie Harrier, MD

## 2016-03-07 NOTE — ED Notes (Signed)
ED Provider at bedside. 

## 2016-03-07 NOTE — ED Triage Notes (Signed)
Pt presents to ED via [facility] from home with c/o shortness of breath x 2 days, states has increased tonight. Per EMS pt was in the 80's% at home, EMS placed pt on non rebreather ranging between 90-95%. Pt reports had new chemo treatment 2 weeks ago and believes that is the cause of her shortness of breath. Pt alert and oriented, placed on 2 L nasal cannula per MD.

## 2016-03-07 NOTE — ED Notes (Signed)
Patient transported to CT via stretcher.

## 2016-03-07 NOTE — Progress Notes (Signed)
Pharmacist - Prescriber Communication  Enoxaparin dose has been modified to 30 mg subcutaneously once daily for low body weight.  Kynslee Baham A. [place], [place].D., BCPS Clinical Pharmacist 03/07/2016 [phone]

## 2016-03-07 NOTE — ED Notes (Signed)
Attempted to ambulate pt and monitor O2 sats per MD order; when pt stood up and got her balance, the O2 sats had dropped to 84%; pt was laid back on the bed, and the O2 sats climbed to 98% within 2 minutes

## 2016-03-07 NOTE — Progress Notes (Addendum)
Initial Nutrition Assessment  DOCUMENTATION CODES:   Severe malnutrition in context of chronic illness  INTERVENTION:  1. Begin Jevity 1.5 boluses via PEG @ 0600, 1000, 1500, 2000 Provides 1420 calories, 60gm protein, and 720cc free water 2. Encourage PO intake of liquids, soups, pt should be ok to meet hydration needs via PO.  NUTRITION DIAGNOSIS:   Malnutrition related to chronic illness as evidenced by severe depletion of muscle mass, severe depletion of body fat, percent weight loss.  GOAL:   Patient will meet greater than or equal to 90% of their needs  MONITOR:   I & O's, Labs, Weight trends, Supplement acceptance, PO intake, TF tolerance  REASON FOR ASSESSMENT:   Consult Enteral/tube feeding initiation and management  ASSESSMENT:   Rebecca Mcmillan  is a 70 y.o. female with a known history of Head and neck cancer, dysphagia, hypertension, hyperlipidemia, peripheral vascular disease, hypothyroidism presented to the emergency room with increased shortness of breath for the last few days  Spoke with Rebecca Mcmillan at bedside - she reports being 118# in May, currently 64#, exhibiting a 54#/45% severe wt loss over 7 months. Nutrition-Focused physical exam completed. Findings are severe fat depletion, severe muscle depletion, and no edema.   Most recent Chemo was 2 weeks ago. Now has R L PNA.  She denies any nausea/vomiting at this point. Only consuming liquids, soups, cannot consume solids as they cannot pass through patient's esophagus. Was drinking water during my visit.  Will continue regimen implemented by Rebecca Mcmillan RD - pt was tolerating 4 cans of Jevity 1.5 previously with no issues.  Labs and medications reviewed: Na 127 Colace, Miralax NS @ 53mL/hr  Diet Order:  Diet Mcmillan Room service appropriate? Yes; Fluid consistency: Thin  Skin:  Reviewed, no issues  Last BM:  PTA  Height:   Ht Readings from Last 1 Encounters:  03/07/16 5' (1.524 m)     Weight:   Wt Readings from Last 1 Encounters:  03/07/16 64 lb (29 kg)    Ideal Body Weight:  45.45 kg  BMI:  Body mass index is 12.5 kg/m.  Estimated Nutritional Needs:   Kcal:  1305-1450 calories  Protein:  43 - 73 gm  Fluid:  >/= 1.3L  EDUCATION NEEDS:   No education needs identified at this time  Satira Anis. Halie Gass, MS, RD LDN Inpatient Clinical Dietitian Pager [phone]

## 2016-03-07 NOTE — H&P (Signed)
[facility] [place] NAME: Rebecca Mcmillan    MR#:  [id number]  DATE OF BIRTH:  1945-03-25  DATE OF ADMISSION:  03/06/2016  PRIMARY CARE PHYSICIAN: Tracie Harrier, MD   REQUESTING/REFERRING PHYSICIAN:   CHIEF COMPLAINT:   Chief Complaint  Patient presents with  . Shortness of Breath    HISTORY OF PRESENT ILLNESS: Rebecca Mcmillan  is a 70 y.o. female with a known history of Head and neck cancer, dysphagia, hypertension, hyperlipidemia, peripheral vascular disease, hypothyroidism presented to the emergency room with increased shortness of breath for the last few days. Patient felt short of breath but no complaints of any cough or fever. 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 She has been diagnosed with cancer in April 2017. It started as a Mass in the left side of the neck as well as the left mandibular area.Mass has shrunk in size after having chemotherapy. Currently she has a small draining site over the cancer mass. Tenderness is noted at the local site. Patient was evaluated with a CT angiogram of the chest which showed right lung pneumonia with no evidence of pulmonary embolism. Hospitalist service was consulted for further care of the patient. No complaints of any chest pain, orthopnea or proximal nocturnal dyspnea. Still actively smokes.  PAST MEDICAL HISTORY:   Past Medical History:  Diagnosis Date  . Dysphagia   . G tube feedings ([place])   . Hyperlipidemia   . Hypertension   . Hypokalemia   . Hypothyroidism   . Malnourished ([place])   . Metastatic cancer ([place])   . 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 Peripheral vascular disease ([place])   . Thyroid disease     PAST SURGICAL HISTORY: Past Surgical History:  Procedure Laterality Date  . ABDOMINAL HYSTERECTOMY    . FINE NEEDLE ASPIRATION N/A 07/02/2015   Procedure: FINE NEEDLE ASPIRATION;  Surgeon: Beverly Gust, MD;  Location: [facility];  Service: ENT;   Laterality: N/A;  . FINGER SURGERY Right    thumb  . PERIPHERAL VASCULAR CATHETERIZATION N/A 07/15/2015   Procedure: Glori Luis Cath Insertion;  Surgeon: Algernon Huxley, MD;  Location: [facility];  Service: Cardiovascular;  Laterality: N/A;  . TONGUE BIOPSY N/A 07/02/2015   Procedure: TONGUE BIOPSY;  Surgeon: Beverly Gust, MD;  Location: [facility];  Service: ENT;  Laterality: N/A;    SOCIAL HISTORY:  Social History  Substance Use Topics  . Smoking status: Former Smoker    Packs/day: 1.00    Years: 45.00    Types: Cigarettes    Quit date: 06/19/2015  . Smokeless tobacco: Never Used  . Alcohol use No    FAMILY HISTORY: No family history on file.  DRUG ALLERGIES:  Allergies  Allergen Reactions  . Garlic Swelling    "Eyes swell up"  . Lactose Intolerance (Gi)   . Tetracyclines & Related     Per patient breaks out in hives given 5-6 years ago pt doesn't remember where this happened     REVIEW OF SYSTEMS:   CONSTITUTIONAL: No fever, has weakness.  EYES: No blurred or double vision.  EARS, NOSE, AND THROAT: No tinnitus or ear pain.  RESPIRATORY: No cough, has shortness of breath,  No wheezing or hemoptysis.  CARDIOVASCULAR: No chest pain, orthopnea, edema.  GASTROINTESTINAL: No nausea, vomiting, diarrhea or abdominal pain.  GENITOURINARY: No dysuria, hematuria.  ENDOCRINE: No polyuria, nocturia,  HEMATOLOGY: No anemia, easy bruising or bleeding SKIN:  No rash or lesion. Has open draining lesion over left mandibular area, tenderness noted. MUSCULOSKELETAL: No joint pain or arthritis.   NEUROLOGIC: No tingling, numbness, weakness.  PSYCHIATRY: No anxiety or depression.   MEDICATIONS AT HOME:  Prior to Admission medications   Medication Sig Start Date End Date Taking? Authorizing Provider  atenolol (TENORMIN) 25 MG tablet Take 25 mg by mouth daily.  05/16/15   Historical Provider, MD  docusate sodium (COLACE) 100 MG capsule Take 100 mg by mouth 2 (two) times daily.     Historical Provider, MD  Gauze Pads & Dressings ([place]) 4"X4" PADS 1 each by Does not apply route 1 day or 1 dose. 09/05/15   Cammie Sickle, MD  Irrigation Supplies (PISTON IRRIGATION SYRINGE) MISC 1 Syringe by Does not apply route 1 day or 1 dose. 09/05/15   Cammie Sickle, MD  levothyroxine (SYNTHROID, LEVOTHROID) 25 MCG tablet Take 1 tablet (25 mcg total) by mouth daily before breakfast. 01/24/16   Cammie Sickle, MD  levothyroxine (SYNTHROID, LEVOTHROID) 50 MCG tablet Take 50 mcg by mouth daily before breakfast.  06/14/15   Historical Provider, MD  lidocaine-prilocaine (EMLA) cream Apply 1 application topically as needed. 07/10/15   Cammie Sickle, MD  losartan (COZAAR) 100 MG tablet Take 100 mg by mouth daily.  01/22/16   Historical Provider, MD  magic mouthwash w/lidocaine SOLN Take 5 mLs by mouth 4 (four) times daily. 80 ml viscous lidocaine 2%, 80 ml Mylanta, 80 ml Diphenhydramine 12.5 mg/5 ml Elixir, 80 ml Nystatin 100,000 Unit suspension, 80 ml Prednisolone 15 mg/79ml, 80 ml Distilled Water. Sig: Swish/Swallow 5-10 ml four times a day as needed. Dispense 480 ml. 3RFs 02/28/16   Cammie Sickle, MD  Morphine Sulfate (MORPHINE CONCENTRATE) 10 mg / 0.5 ml concentrated solution Take 0.25 mLs (5 mg total) by mouth every 6 (six) hours as needed for moderate pain or severe pain. 02/28/16   Cammie Sickle, MD  Nutritional Supplements (FEEDING SUPPLEMENT, JEVITY 1.5 CAL,) LIQD Place 1,000 mLs into feeding tube continuous. 4 to 5 cans via tube feed    Historical Provider, MD  ondansetron (ZOFRAN) 8 MG tablet TAKE 1 TABLET EVERY 8 HOURS AS NEEDED FOR NAUSEA AND VOMITING (START 3 DAYS AFTER CHEMOTHERAPY) 02/17/16   Cammie Sickle, MD  polyethylene glycol (MIRALAX / GLYCOLAX) packet Take 17 g by mouth daily.    Historical Provider, MD  prochlorperazine (COMPAZINE) 10 MG tablet TAKE 1 TABLET EVERY 6 HOURS AS NEEDED FOR NAUSEA OR VOMITING 02/19/16   Cammie Sickle, MD  Water For Irrigation, Sterile (STERILE WATER FOR IRRIGATION) Irrigate with 1,000 mLs as directed once. 09/05/15   Cammie Sickle, MD      PHYSICAL EXAMINATION:   VITAL SIGNS: Blood pressure (!) 141/88, pulse 86, temperature 99 F (37.2 C), temperature source Axillary, resp. rate 17, height 5' (1.524 m), weight 32.7 kg (72 lb 3.2 oz), SpO2 100 %.  GENERAL:  70 y.o.-year-old patient lying in the bed with no acute distress.  EYES: Pupils equal, round, reactive to light and accommodation. No scleral icterus. Extraocular muscles intact.  HEENT: Head atraumatic, normocephalic. Oropharynx and nasopharynx clear.  NECK:  Supple, no jugular venous distention. No thyroid enlargement, no tenderness.  LUNGS: Normal breath sounds bilaterally, rales heard in right lung. No use of accessory muscles of respiration.  CARDIOVASCULAR: S1, S2 normal. No murmurs, rubs, or gallops.  ABDOMEN: Soft, nontender, nondistended. Bowel sounds present. No organomegaly or mass.  EXTREMITIES: No pedal edema, cyanosis, or clubbing.  NEUROLOGIC: Cranial nerves II through XII are intact. Muscle strength 5/5 in all extremities. Sensation intact. Gait not checked.  PSYCHIATRIC: The patient is alert and oriented x 3.  SKIN: Mass noted over left mandibular area extending on to neck. Open draining site noted over the left chin area.   LABORATORY PANEL:   CBC  Recent Labs Lab 03/07/16 0010  WBC 13.6*  HGB 10.1*  HCT 29.0*  PLT 304  MCV 102.6*  MCH 35.9*  MCHC 35.0  RDW 14.8*  LYMPHSABS 0.5*  MONOABS 0.3  EOSABS 0.0  BASOSABS 0.0   ------------------------------------------------------------------------------------------------------------------  Chemistries   Recent Labs Lab 03/07/16 0010  NA 127*  K 4.2  CL 89*  CO2 25  GLUCOSE 94  BUN 18  CREATININE 0.62  CALCIUM 9.2    ------------------------------------------------------------------------------------------------------------------ estimated creatinine clearance is 34.4 mL/min (by C-G formula based on SCr of 0.62 mg/dL). ------------------------------------------------------------------------------------------------------------------ No results for input(s): TSH, T4TOTAL, T3FREE, THYROIDAB in the last 72 hours.  Invalid input(s): FREET3   Coagulation profile No results for input(s): INR, PROTIME in the last 168 hours. ------------------------------------------------------------------------------------------------------------------- No results for input(s): DDIMER in the last 72 hours. -------------------------------------------------------------------------------------------------------------------  Cardiac Enzymes  Recent Labs Lab 03/07/16 0010  TROPONINI <0.03   ------------------------------------------------------------------------------------------------------------------ Invalid input(s): POCBNP  ---------------------------------------------------------------------------------------------------------------  Urinalysis No results found for: COLORURINE, APPEARANCEUR, LABSPEC, PHURINE, GLUCOSEU, HGBUR, BILIRUBINUR, KETONESUR, PROTEINUR, UROBILINOGEN, NITRITE, LEUKOCYTESUR   RADIOLOGY: Ct Angio Chest Pe W And/or Wo Contrast  Result Date: 03/07/2016 CLINICAL DATA:  Dyspnea for 2 days, worsened tonight.  Hypoxia. EXAM: CT ANGIOGRAPHY CHEST WITH CONTRAST TECHNIQUE: Multidetector CT imaging of the chest was performed using the standard protocol during bolus administration of intravenous contrast. Multiplanar CT image reconstructions and MIPs were obtained to evaluate the vascular anatomy. CONTRAST:  60 mL Isovue 370 intravenous COMPARISON:  06/26/2015 FINDINGS: Cardiovascular: Excellent opacification of the pulmonary arteries to the subsegmental level. No evidence of pulmonary embolism. Normal  heart size. No pericardial effusion. Mediastinum/Nodes: No enlarged mediastinal, hilar, or axillary lymph nodes. Thyroid gland, trachea, and esophagus demonstrate no significant findings. Lungs/Pleura: There is consolidation and mild volume loss in the central right middle lobe. There is patchy subpleural opacity in the posterior left lower lobe. This may represent multifocal pneumonia. Paraseptal emphysematous blebs are present in the upper lobes. No pleural effusions. Airways are patent and normal in caliber. Upper Abdomen: No acute abnormality. Musculoskeletal: No significant skeletal lesion. Review of the MIP images confirms the above findings. IMPRESSION: 1. Excellent opacification of the pulmonary vasculature. No pulmonary emboli. 2. Consolidation and mild volume loss in the right middle lobe, suspicious for pneumonia. Smaller patchy subpleural opacity in the posterior left lower lobe may represent another focus of pneumonia. No effusions. Electronically Signed   By: Andreas Newport M.D.   On: 03/07/2016 03:08   Dg Chest Port 1 View  Result Date: 03/07/2016 CLINICAL DATA:  Shortness of breath EXAM: PORTABLE CHEST 1 VIEW COMPARISON:  Chest radiograph 11/22/2015 FINDINGS: Right chest wall Port-A-Cath tip overlies the proximal right atrium. There is atherosclerotic calcification in the aortic arch. Cardiomediastinal contours are otherwise normal. The lungs are hyperexpanded without focal consolidation or pulmonary edema. No pleural effusion or pneumothorax. IMPRESSION: 1. COPD and aortic atherosclerosis. 2. No focal airspace disease or pulmonary edema. Electronically Signed   By: Ulyses Jarred M.D.   On: 03/07/2016 00:32    EKG: Orders placed or performed during the hospital encounter of 03/06/16  . ED EKG  .  ED EKG    IMPRESSION AND PLAN: 70 year old female patient with history of head and neck cancer, dysphagia, hypertension, hyperlipidemia presented to the emergency room with difficulty  breathing. Admitting diagnosis 1. Right lung community-acquired pneumonia 2. Head and neck cancer 3. Hypertension 4. Hyperlipidemia 5. Leukocytosis secondary to pneumonia Treatment plan Mid patient to medical floor IV fluid hydration Start patient on IV Rocephin and IV Zithromax antibiotics Pain management Resume atenolol for hypertension DVT prophylaxis with subcutaneous Lovenox 40 MG daily Oncology follow-up as outpatient.  All the records are reviewed and case discussed with ED provider. Management plans discussed with the patient, family and they are in agreement.  CODE STATUS:DNR Code Status History    This patient does not have a recorded code status. Please follow your organizational policy for patients in this situation.    Advance Directive Documentation   Flowsheet Row Most Recent Value  Type of Advance Directive  Healthcare Power of Attorney, Out of facility DNR (pink MOST or yellow form)  Pre-existing out of facility DNR order (yellow form or pink MOST form)  No data  "MOST" Form in Place?  No data       TOTAL TIME TAKING CARE OF THIS PATIENT: 52 minutes.    Saundra Shelling M.D on 03/07/2016 at 5:43 AM  Between 7am to 6pm - Pager - [phone]  After 6pm go to [email or web address] - password [email or web address]  Office  [phone]  CC: Primary care physician; Tracie Harrier, MD

## 2016-03-08 LAB — BASIC METABOLIC PANEL
Anion gap: 5 (ref 5–15)
BUN: 12 mg/dL (ref 6–20)
CALCIUM: 8.1 mg/dL — AB (ref 8.9–10.3)
CHLORIDE: 95 mmol/L — AB (ref 101–111)
CO2: 29 mmol/L (ref 22–32)
CREATININE: 0.52 mg/dL (ref 0.44–1.00)
GFR calc non Af Amer: >60 mL/min (ref >60)
Glucose, Bld: 143 mg/dL — ABNORMAL HIGH (ref 65–99)
Potassium: 3.8 mmol/L (ref 3.5–5.1)
SODIUM: 129 mmol/L — AB (ref 135–145)

## 2016-03-08 LAB — CBC
HCT: 23.9 % — ABNORMAL LOW (ref 35.0–47.0)
Hemoglobin: 8.5 g/dL — ABNORMAL LOW (ref 12.0–16.0)
MCH: 36.6 pg — ABNORMAL HIGH (ref 26.0–34.0)
MCHC: 35.4 g/dL (ref 32.0–36.0)
MCV: 103.5 fL — ABNORMAL HIGH (ref 80.0–100.0)
PLATELETS: 240 10*3/uL (ref 150–440)
RBC: 2.31 MIL/uL — AB (ref 3.80–5.20)
RDW: 15.1 % — ABNORMAL HIGH (ref 11.5–14.5)
WBC: 10.5 10*3/uL (ref 3.6–11.0)

## 2016-03-08 MED ORDER — CEPHALEXIN 500 MG PO CAPS: 500.0000 mg | ORAL_CAPSULE | Freq: Two times a day (BID) | ORAL | 0 refills | Status: AC

## 2016-03-08 MED ORDER — AZITHROMYCIN 250 MG PO TABS: ORAL_TABLET | ORAL | 0 refills | Status: DC

## 2016-03-08 MED ORDER — DEXTROSE 5 % IV SOLN: INTRAVENOUS | Status: DC

## 2016-03-08 MED ORDER — AZITHROMYCIN 250 MG PO TABS: 250.0000 mg | ORAL_TABLET | Freq: Every day | ORAL | Status: DC

## 2016-03-08 MED ORDER — CEPHALEXIN 500 MG PO CAPS
500.0000 mg | ORAL_CAPSULE | Freq: Two times a day (BID) | ORAL | Status: DC
  Administered 2016-03-08: 09:00:00 500 mg via ORAL
  Filled 2016-03-08: qty 1

## 2016-03-08 NOTE — Discharge Instructions (Signed)
Dressings to left jaw daily

## 2016-03-08 NOTE — Discharge Summary (Signed)
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 Dysphagia   . G tube feedings (Rebecca Mcmillan)   . Hyperlipidemia   . Hypertension   . Hypokalemia   . Hypothyroidism   . Malnourished (Rebecca Mcmillan)   . Metastatic cancer (Rebecca Mcmillan)   . Oral-mouth cancer (Rebecca Mcmillan)    tongue  . Peripheral vascular disease (Rebecca Mcmillan)   . Thyroid disease     HOSPITAL COURSE:   70 year old female patient with history of head and neck cancer, dysphagia, hypertension, hyperlipidemia presented to the emergency room with difficulty breathing.  1.Right lung community-acquired pneumonia -IV Rocephin and Zithromax---change to roal meds - blood cultures are negative so far - white count which showed seems to be improving. Patient is afebrile.  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 Patient has last chemotherapy 02/13/2016. Patient reports she was so drained out she is not going to take anymore chemotherapy according to her. -She has some drainage from the surgical site. wound consult not done due to holidays -wound culture -few GPC and Few GNR -cont above abxs. Dressing done. Supplies given Pt advised to call oncology on Tuesday and get it checked ot -Consider oncology consultation if needed  3.Hypertension -Continue home meds  4.Hyperlipidemia  5.Leukocytosis secondary to  pneumonia  Overall stable d/c home CONSULTS OBTAINED:    DRUG ALLERGIES:   Allergies  Allergen Reactions  . Garlic Swelling    "Eyes swell up"  . Lactose Intolerance (Gi)   . Tetracyclines & Related     Per patient breaks out in hives given 5-6 years ago pt doesn't remember where this happened     DISCHARGE MEDICATIONS:   Current Discharge Medication List    START taking these medications   Details  azithromycin (ZITHROMAX) 250 MG tablet take daily as directed Qty: 4 each, Refills: 0    cephALEXin (KEFLEX) 500 MG capsule Take 1 capsule (500 mg total) by mouth every 12 (twelve) hours. Qty: 12 capsule, Refills: 0      CONTINUE these medications which have NOT CHANGED   Details  atenolol (TENORMIN) 25 MG tablet Take 25 mg by mouth daily.    Associated Diagnoses: Tongue cancer (HCC)    docusate sodium (COLACE) 100 MG capsule Take 100 mg by mouth 2 (two) times daily.    Gauze Pads & Dressings (DERMACEA DRAIN SPONGES) 4"X4" PADS 1 each by Does not apply route 1 day or 1 dose. Qty: 50 each, Refills: 0    Irrigation Supplies (PISTON IRRIGATION SYRINGE) MISC 1 Syringe by Does not apply route 1 day or 1 dose. Qty: 30 each, Refills: 0   Associated Diagnoses: G tube feedings (Rebecca Mcmillan)    !! levothyroxine (SYNTHROID, LEVOTHROID) 25 MCG tablet Take 1 tablet (25 mcg total) by mouth daily before breakfast. Qty: 30 tablet, Refills: 3   Associated Diagnoses: Acquired hypothyroidism    !! levothyroxine (SYNTHROID, LEVOTHROID) 50 MCG tablet Take  50 mcg by mouth daily before breakfast.    Associated Diagnoses: Metastatic squamous cell carcinoma (Rebecca Mcmillan); Port-a-cath in place    lidocaine-prilocaine (EMLA) cream Apply 1 application topically as needed. Qty: 30 g, Refills: 6   Associated Diagnoses: Metastatic squamous cell carcinoma (Rebecca Mcmillan); Port-a-cath in place    losartan (COZAAR) 100 MG tablet Take 100 mg by mouth daily.     magic mouthwash w/lidocaine SOLN Take 5 mLs by mouth 4 (four)  times daily. 80 ml viscous lidocaine 2%, 80 ml Mylanta, 80 ml Diphenhydramine 12.5 mg/5 ml Elixir, 80 ml Nystatin 100,000 Unit suspension, 80 ml Prednisolone 15 mg/65ml, 80 ml Distilled Water. Sig: Swish/Swallow 5-10 ml four times a day as needed. Dispense 480 ml. 3RFs Qty: 480 mL, Refills: 6   Associated Diagnoses: Cancer of floor of mouth (HCC)    Morphine Sulfate (MORPHINE CONCENTRATE) 10 mg / 0.5 ml concentrated solution Take 0.25 mLs (5 mg total) by mouth every 6 (six) hours as needed for moderate pain or severe pain. Qty: 30 mL, Refills: 0   Associated Diagnoses: Cancer of floor of mouth (HCC)    Nutritional Supplements (FEEDING SUPPLEMENT, JEVITY 1.5 CAL,) LIQD Place 1,000 mLs into feeding tube continuous. 4 to 5 cans via tube feed    ondansetron (ZOFRAN) 8 MG tablet TAKE 1 TABLET EVERY 8 HOURS AS NEEDED FOR NAUSEA AND VOMITING (START 3 DAYS AFTER CHEMOTHERAPY) Qty: 40 tablet, Refills: 0    polyethylene glycol (MIRALAX / GLYCOLAX) packet Take 17 g by mouth daily.    prochlorperazine (COMPAZINE) 10 MG tablet TAKE 1 TABLET EVERY 6 HOURS AS NEEDED FOR NAUSEA OR VOMITING Qty: 30 tablet, Refills: 0   Associated Diagnoses: Chemotherapy induced nausea and vomiting    Water For Irrigation, Sterile (STERILE WATER FOR IRRIGATION) Irrigate with 1,000 mLs as directed once. Qty: 1000 mL, Refills: 0     !! - Potential duplicate medications found. Please discuss with provider.      If you experience worsening of your admission symptoms, develop shortness of breath, life threatening emergency, suicidal or homicidal thoughts you must seek medical attention immediately by calling 911 or calling your MD immediately  if symptoms less severe.  You Must read complete instructions/literature along with all the possible adverse reactions/side effects for all the Medicines you take and that have been prescribed to you. Take any new Medicines after you have completely understood and accept all the possible  adverse reactions/side effects.   Please note  You were cared for by a hospitalist during your hospital stay. If you have any questions about your discharge medications or the care you received while you were in the hospital after you are discharged, you can call the unit and asked to speak with the hospitalist on call if the hospitalist that took care of you is not available. Once you are discharged, your primary care physician will handle any further medical issues. Please note that NO REFILLS for any discharge medications will be authorized once you are discharged, as it is imperative that you return to your primary care physician (or establish a relationship with a primary care physician if you do not have one) for your aftercare needs so that they can reassess your need for medications and monitor your lab values. Today   SUBJECTIVE   Feels better  VITAL SIGNS:  Blood pressure 131/73, pulse 86, temperature 97.6 F (36.4 C), temperature source Oral, resp. rate 17, height 5' (1.524 m), weight 29 kg (64 lb), SpO2 100 %.  I/O:  Intake/Output Summary (Last 24 hours) at 03/08/16 0929 Last data filed at 03/07/16 1651  Gross per 24 hour  Intake              666 ml  Output                0 ml  Net              666 ml    PHYSICAL EXAMINATION:  GENERAL:  70 y.o.-year-old patient lying in the bed with no acute distress. Thin/cacehctic EYES: Pupils equal, round, reactive to light and accommodation. No scleral icterus. Extraocular muscles intact.  HEENT: Head atraumatic, normocephalic. Oropharynx and nasopharynx clear. Left jaw 3x3 cem area with drainage-minimal. No cellulitis NECK:  Supple, no jugular venous distention. No thyroid enlargement, no tenderness.  LUNGS: Normal breath sounds bilaterally, no wheezing, rales,rhonchi or crepitation. No use of accessory muscles of respiration.  CARDIOVASCULAR: S1, S2 normal. No murmurs, rubs, or gallops.  ABDOMEN: Soft, non-tender, non-distended.  Bowel sounds present. No organomegaly or mass.  EXTREMITIES: No pedal edema, cyanosis, or clubbing.  NEUROLOGIC: Cranial nerves II through XII are intact. Muscle strength 5/5 in all extremities. Sensation intact. Gait not checked.  PSYCHIATRIC: The patient is alert and oriented x 3.  SKIN: No obvious rash, lesion, or ulcer.   DATA REVIEW:   CBC   Recent Labs Lab 03/08/16 0431  WBC 10.5  HGB 8.5*  HCT 23.9*  PLT 240    Chemistries   Recent Labs Lab 03/08/16 0431  NA 129*  K 3.8  CL 95*  CO2 29  GLUCOSE 143*  BUN 12  CREATININE 0.52  CALCIUM 8.1*    Microbiology Results   Recent Results (from the past 240 hour(s))  Aerobic Culture (superficial specimen)     Status: None (Preliminary result)   Collection Time: 03/07/16 12:44 PM  Result Value Ref Range Status   Specimen Description TISSUE  Final   Special Requests NONE  Final   Gram Stain   Final    FEW WBC PRESENT,BOTH PMN AND MONONUCLEAR MODERATE GRAM POSITIVE RODS FEW GRAM NEGATIVE RODS Performed at [facility]    Culture PENDING  Incomplete   Report Status PENDING  Incomplete    RADIOLOGY:  Ct Angio Chest Pe W And/or Wo Contrast  Result Date: 03/07/2016 CLINICAL DATA:  Dyspnea for 2 days, worsened tonight.  Hypoxia. EXAM: CT ANGIOGRAPHY CHEST WITH CONTRAST TECHNIQUE: Multidetector CT imaging of the chest was performed using the standard protocol during bolus administration of intravenous contrast. Multiplanar CT image reconstructions and MIPs were obtained to evaluate the vascular anatomy. CONTRAST:  60 mL Isovue 370 intravenous COMPARISON:  06/26/2015 FINDINGS: Cardiovascular: Excellent opacification of the pulmonary arteries to the subsegmental level. No evidence of pulmonary embolism. Normal heart size. No pericardial effusion. Mediastinum/Nodes: No enlarged mediastinal, hilar, or axillary lymph nodes. Thyroid gland, trachea, and esophagus demonstrate no significant findings. Lungs/Pleura: There is  consolidation and mild volume loss in the central right middle lobe. There is patchy subpleural opacity in the posterior left lower lobe. This may represent multifocal pneumonia. Paraseptal emphysematous blebs are present in the upper lobes. No pleural effusions. Airways are patent and normal in caliber. Upper Abdomen: No acute abnormality. Musculoskeletal: No significant skeletal lesion. Review of the MIP images confirms the above findings. IMPRESSION: 1. Excellent opacification of the pulmonary vasculature. No pulmonary emboli. 2. Consolidation and mild volume loss in the right middle lobe, suspicious for pneumonia. Smaller patchy subpleural opacity in the posterior  left lower lobe may represent another focus of pneumonia. No effusions. Electronically Signed   By: Andreas Newport M.D.   On: 03/07/2016 03:08   Dg Chest Port 1 View  Result Date: 03/07/2016 CLINICAL DATA:  Shortness of breath EXAM: PORTABLE CHEST 1 VIEW COMPARISON:  Chest radiograph 11/22/2015 FINDINGS: Right chest wall Port-A-Cath tip overlies the proximal right atrium. There is atherosclerotic calcification in the aortic arch. Cardiomediastinal contours are otherwise normal. The lungs are hyperexpanded without focal consolidation or pulmonary edema. No pleural effusion or pneumothorax. IMPRESSION: 1. COPD and aortic atherosclerosis. 2. No focal airspace disease or pulmonary edema. Electronically Signed   By: Ulyses Jarred M.D.   On: 03/07/2016 00:32     Management plans discussed with the patient, family and they are in agreement.  CODE STATUS:     Code Status Orders        Start     Ordered   03/07/16 0554  Do not attempt resuscitation (DNR)  Continuous    Question Answer Comment  In the event of cardiac or respiratory ARREST Do not call a "code blue"   In the event of cardiac or respiratory ARREST Do not perform Intubation, CPR, defibrillation or ACLS   In the event of cardiac or respiratory ARREST Use medication by any  route, position, wound care, and other measures to relive pain and suffering. May use oxygen, suction and manual treatment of airway obstruction as needed for comfort.      03/07/16 0553    Code Status History    Date Active Date Inactive Code Status Order ID Comments User Context   This patient has a current code status but no historical code status.    Advance Directive Documentation   Flowsheet Row Most Recent Value  Type of Advance Directive  Healthcare Power of Attorney, Living will, Out of facility DNR (pink MOST or yellow form)  Pre-existing out of facility DNR order (yellow form or pink MOST form)  Yellow form placed in chart (order not valid for inpatient use)  "MOST" Form in Place?  No data      TOTAL TIME TAKING CARE OF THIS PATIENT:40 minutes.    Hjalmar Ballengee M.D on 03/08/2016 at 9:29 AM  Between 7am to 6pm - Pager - [phone] After 6pm go to [email or web address] - password [email or web address] [facility]  Office  [phone]  CC: Primary care physician; Tracie Harrier, MD

## 2016-03-08 NOTE — Progress Notes (Signed)
Discussed discharge instructions and medications with pt and her husband. IV removed. All questions addressed. Pt transported home via car by her husband.  Clarise Cruz, RN

## 2016-03-10 LAB — AEROBIC CULTURE W GRAM STAIN (SUPERFICIAL SPECIMEN)

## 2016-03-10 LAB — AEROBIC CULTURE  (SUPERFICIAL SPECIMEN)

## 2016-03-12 ENCOUNTER — Telehealth: Payer: Self-pay | Admitting: *Deleted

## 2016-03-12 MED ORDER — LORAZEPAM 0.5 MG PO TABS: 0.5000 mg | ORAL_TABLET | Freq: Three times a day (TID) | ORAL | 1 refills | Status: AC | PRN

## 2016-03-12 NOTE — Telephone Encounter (Signed)
Requesting prescription for lorazepam 0.5 mg be sent to [facility]  For anxiety and inability to sleep

## 2016-03-18 ENCOUNTER — Other Ambulatory Visit: Payer: Self-pay | Admitting: *Deleted

## 2016-03-18 NOTE — Patient Outreach (Signed)
Transition of care call done.  RN CM followed  up on referral from [facility] received 03/17/16 to follow pt for transition of care - recent hospitalization 12/22-12/24 Community acquired pneumonia of right middle lobe of lung.  Hx of squamous cell cancer left jaw s/p surgery, Peg tube feeding, metastatic cancer.  Spoke with pt, HIPAA/identity verified, discussed purpose of call - follow up on [facility] referral for transition of care.   Also discussed with pt [facility] transition of care program- to follow for  31 days (weekly phone calls, a home visit), no cost to pt.  Pt reports right now Hospice is coming, started last week (nurse coming).   RN CM discussed with pt with [facility]osp[facility] involved, [facility] RN CM will not need to engage.    Plan:  RN CM to inform [facility] care management assistant pt being followed with Hospice, community nurse case management services will not engage.      Zara Chess.   [facility]  [phone]

## 2016-03-20 ENCOUNTER — Inpatient Hospital Stay: Attending: Internal Medicine | Admitting: Internal Medicine

## 2016-03-20 ENCOUNTER — Other Ambulatory Visit: Payer: Self-pay

## 2016-03-20 ENCOUNTER — Inpatient Hospital Stay

## 2016-03-20 VITALS — BP 107/68 | HR 69 | Temp 96.0°F | Wt <= 1120 oz

## 2016-03-20 DIAGNOSIS — Z931 Gastrostomy status: Secondary | ICD-10-CM | POA: Diagnosis not present

## 2016-03-20 DIAGNOSIS — C049 Malignant neoplasm of floor of mouth, unspecified: Secondary | ICD-10-CM

## 2016-03-20 DIAGNOSIS — F17210 Nicotine dependence, cigarettes, uncomplicated: Secondary | ICD-10-CM | POA: Insufficient documentation

## 2016-03-20 DIAGNOSIS — Z79899 Other long term (current) drug therapy: Secondary | ICD-10-CM | POA: Insufficient documentation

## 2016-03-20 DIAGNOSIS — I10 Essential (primary) hypertension: Secondary | ICD-10-CM | POA: Diagnosis not present

## 2016-03-20 DIAGNOSIS — E785 Hyperlipidemia, unspecified: Secondary | ICD-10-CM | POA: Insufficient documentation

## 2016-03-20 DIAGNOSIS — E039 Hypothyroidism, unspecified: Secondary | ICD-10-CM | POA: Insufficient documentation

## 2016-03-20 DIAGNOSIS — E876 Hypokalemia: Secondary | ICD-10-CM | POA: Insufficient documentation

## 2016-03-20 DIAGNOSIS — Z7189 Other specified counseling: Secondary | ICD-10-CM

## 2016-03-20 DIAGNOSIS — I739 Peripheral vascular disease, unspecified: Secondary | ICD-10-CM | POA: Diagnosis not present

## 2016-03-20 LAB — CBC WITH DIFFERENTIAL/PLATELET
BASOS ABS: 0.1 10*3/uL (ref 0–0.1)
BASOS PCT: 1 %
EOS ABS: 0 10*3/uL (ref 0–0.7)
Eosinophils Relative: 0 %
HEMATOCRIT: 25.4 % — AB (ref 35.0–47.0)
HEMOGLOBIN: 8.9 g/dL — AB (ref 12.0–16.0)
Lymphocytes Relative: 2 %
Lymphs Abs: 0.2 10*3/uL — ABNORMAL LOW (ref 1.0–3.6)
MCH: 36.3 pg — ABNORMAL HIGH (ref 26.0–34.0)
MCHC: 35 g/dL (ref 32.0–36.0)
MCV: 103.9 fL — ABNORMAL HIGH (ref 80.0–100.0)
Monocytes Absolute: 0.5 10*3/uL (ref 0.2–0.9)
Monocytes Relative: 5 %
NEUTROS ABS: 8.9 10*3/uL — AB (ref 1.4–6.5)
NEUTROS PCT: 92 %
Platelets: 245 10*3/uL (ref 150–440)
RBC: 2.45 MIL/uL — ABNORMAL LOW (ref 3.80–5.20)
RDW: 16.2 % — AB (ref 11.5–14.5)
WBC: 9.7 10*3/uL (ref 3.6–11.0)

## 2016-03-20 LAB — COMPREHENSIVE METABOLIC PANEL
ALBUMIN: 3.6 g/dL (ref 3.5–5.0)
ALK PHOS: 75 U/L (ref 38–126)
ALT: 16 U/L (ref 14–54)
AST: 22 U/L (ref 15–41)
Anion gap: 11 (ref 5–15)
BILIRUBIN TOTAL: 0.9 mg/dL (ref 0.3–1.2)
BUN: 21 mg/dL — AB (ref 6–20)
CO2: 28 mmol/L (ref 22–32)
Calcium: 9.1 mg/dL (ref 8.9–10.3)
Chloride: 91 mmol/L — ABNORMAL LOW (ref 101–111)
Creatinine, Ser: 0.73 mg/dL (ref 0.44–1.00)
GFR calc Af Amer: >60 mL/min (ref >60)
GFR calc non Af Amer: >60 mL/min (ref >60)
Glucose, Bld: 183 mg/dL — ABNORMAL HIGH (ref 65–99)
POTASSIUM: 4.1 mmol/L (ref 3.5–5.1)
SODIUM: 130 mmol/L — AB (ref 135–145)
TOTAL PROTEIN: 7.4 g/dL (ref 6.5–8.1)

## 2016-03-20 LAB — MAGNESIUM: Magnesium: 2.1 mg/dL (ref 1.7–2.4)

## 2016-03-20 MED ORDER — HYDROCOD POLST-CPM POLST ER 10-8 MG/5ML PO SUER: 2.5000 mL | Freq: Two times a day (BID) | ORAL | 0 refills | Status: AC | PRN

## 2016-03-20 NOTE — Assessment & Plan Note (Addendum)
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 Poor tolerance to chemotherapy carbotaxol approximately 4 weeks ago. Patient declines further chemotherapy.  # Pain-currently on  morphine liquid prn. Stable.  # Cough likely secondary to aspiration- recommend use of tusionex; no prescription given.  # hypothyroidism recent TSH 8; on snthroid.   # Reviewed/counselled regarding the goals of care- being palliative/  Recommend continued hospice. Discussed that life expectancy is in order of few months- depends upon rate of progression of disease.    # no follow up.

## 2016-03-20 NOTE — Progress Notes (Signed)
Patient here today for follow up.  Patient seen in ED 2 weeks ago.  Patient states that tumor on side of her face has

## 2016-03-20 NOTE — Progress Notes (Signed)
[name] NOTE  Patient Care Team: Tracie Harrier, MD as PCP - General (Internal Medicine) Noreene Filbert, MD as Referring Physician (Radiation Oncology) Algernon Huxley, MD as Referring Physician (Vascular Surgery)  CHIEF COMPLAINTS/PURPOSE OF CONSULTATION:   Oncology History   # April 2017-SCC [STAGE IVA T4N2; Dr.McQueen; Left floor of mouth -Bx; Left neck LN-FNA+] left tongue/ floor of mouth- 4 x 3 x 2 cm.; 4.3 x 3.3 x 3 cm complex mass extends through the left mylohyoid muscle below the left mandible anterior to left submandibular gland] - START Cis [5/15]- weekly with RT [5/17]  # SEP 21st 2017- PET progression; OCT 2017- NIVO every 2 w x3;   # NOV 30th- PROGRESSION - recommend carbo-taxol q 3 W  # s/p PEG [September 06 2015]  # Smoker # weight loss     Cancer of floor of mouth ([place])   07/10/2015 Initial Diagnosis    Cancer of floor of mouth ([place])       HISTORY OF PRESENTING ILLNESS:  Rebecca Mcmillan 71 y.o.  female with above history of  squamous cell carcinoma of the left base of tongue/floor of mouth Currently on palliative therapy with [place] Taxol status post cycle #1 Approximately 4 weeks ago is here for follow-up. Patient had declined further chemotherapy.  In the interim patient was seen in the hospital for "pneumonia". Treated With antibiotics.  Patient continues to complain of significant fatigue. Poor appetite. Continues to use PEG tube. Continues to lose weight. She notes to have draining of the left neck mass.    ROS: A complete 10 point review of system is done which is negative except mentioned above in history of present illness  MEDICAL HISTORY:  Past Medical History:  Diagnosis Date  . Dysphagia   . G tube feedings ([place])   . Hyperlipidemia   . Hypertension   . Hypokalemia   . Hypothyroidism   . Malnourished ([place])   . Metastatic cancer ([place])   . Oral-mouth cancer ([place])    tongue  . Peripheral vascular disease ([place])   .  Thyroid disease     SURGICAL HISTORY: None  SOCIAL HISTORY: Patient previously used to work in nursing homes. Denies any alcohol. Smokes a pack of Celexa day for 45 years lives with her husband in [place]. No children.  Social History   Social History  . Marital status: Married    Spouse name: N/A  . Number of children: N/A  . Years of education: N/A   Occupational History  . Not on file.   Social History Main Topics  . Smoking status: Former Smoker    Packs/day: 1.00    Years: 45.00    Types: Cigarettes    Quit date: 06/19/2015  . Smokeless tobacco: Never Used  . Alcohol use No  . Drug use: No  . Sexual activity: Not on file   Other Topics Concern  . Not on file   Social History Narrative  . No narrative on file    FAMILY HISTORY:no family history of head and neck cancer.   ALLERGIES:  is allergic to garlic; lactose intolerance (gi); and tetracyclines & related.  MEDICATIONS:  Current Outpatient Prescriptions  Medication Sig Dispense Refill  . atenolol (TENORMIN) 25 MG tablet Take 25 mg by mouth daily.     . cephALEXin (KEFLEX) 500 MG capsule Take 1 capsule (500 mg total) by mouth every 12 (twelve) hours. 12 capsule 0  . docusate sodium (COLACE) 100 MG capsule  Take 100 mg by mouth 2 (two) times daily.    . Gauze Pads & Dressings (DERMACEA DRAIN SPONGES) 4"X4" PADS 1 each by Does not apply route 1 day or 1 dose. 50 each 0  . Irrigation Supplies (PISTON IRRIGATION SYRINGE) MISC 1 Syringe by Does not apply route 1 day or 1 dose. 30 each 0  . levothyroxine (SYNTHROID, LEVOTHROID) 25 MCG tablet Take 1 tablet (25 mcg total) by mouth daily before breakfast. 30 tablet 3  . levothyroxine (SYNTHROID, LEVOTHROID) 50 MCG tablet Take 50 mcg by mouth daily before breakfast.     . lidocaine-prilocaine (EMLA) cream Apply 1 application topically as needed. 30 g 6  . LORazepam (ATIVAN) 0.5 MG tablet Take 1 tablet (0.5 mg total) by mouth every 8 (eight) hours as needed for anxiety. 45  tablet 1  . losartan (COZAAR) 100 MG tablet Take 100 mg by mouth daily.     . magic mouthwash w/lidocaine SOLN Take 5 mLs by mouth 4 (four) times daily. 80 ml viscous lidocaine 2%, 80 ml Mylanta, 80 ml Diphenhydramine 12.5 mg/5 ml Elixir, 80 ml Nystatin 100,000 Unit suspension, 80 ml Prednisolone 15 mg/48ml, 80 ml Distilled Water. Sig: Swish/Swallow 5-10 ml four times a day as needed. Dispense 480 ml. 3RFs 480 mL 6  . Morphine Sulfate (MORPHINE CONCENTRATE) 10 mg / 0.5 ml concentrated solution Take 0.25 mLs (5 mg total) by mouth every 6 (six) hours as needed for moderate pain or severe pain. 30 mL 0  . Nutritional Supplements (FEEDING SUPPLEMENT, JEVITY 1.5 CAL,) LIQD Place 1,000 mLs into feeding tube continuous. 4 to 5 cans via tube feed    . ondansetron (ZOFRAN) 8 MG tablet TAKE 1 TABLET EVERY 8 HOURS AS NEEDED FOR NAUSEA AND VOMITING (START 3 DAYS AFTER CHEMOTHERAPY) 40 tablet 0  . polyethylene glycol (MIRALAX / GLYCOLAX) packet Take 17 g by mouth daily.    . prochlorperazine (COMPAZINE) 10 MG tablet TAKE 1 TABLET EVERY 6 HOURS AS NEEDED FOR NAUSEA OR VOMITING 30 tablet 0  . Water For Irrigation, Sterile (STERILE WATER FOR IRRIGATION) Irrigate with 1,000 mLs as directed once. 1000 mL 0  . chlorpheniramine-HYDROcodone (TUSSIONEX PENNKINETIC ER) 10-8 MG/5ML SUER Take 2.5 mLs by mouth every 12 (twelve) hours as needed for cough. 140 mL 0   No current facility-administered medications for this visit.       Marland Kitchen  PHYSICAL EXAMINATION: ECOG PERFORMANCE STATUS: 1 - Symptomatic but completely ambulatory  Vitals:   03/20/16 1207  BP: 107/68  Pulse: 69  Temp: (!) 96 F (35.6 C)   Filed Weights   03/20/16 1207  Weight: 68 lb (30.8 kg)    GENERAL: Thin builtCachectic-appearing nourished Caucasian female patient anxious. Alert, no distress and comfortable. Accompanied by her husband. EYES: no pallor or icterus OROPHARYNX: no thrush or ulceration; poor dentition. No obvious masses noted in the  oral cavity.Thick secretions noted NECK: suppl worsening of the left neck adenopathy.  Non-tender. Right neck 3x5 x3.5 mass [increasing] LN felt. Mass is bandaged. LYMPH:  no palpable lymphadenopathy xillary or inguinal regions LUNGS: Decreased breath sounds bilaterally No wheeze or crackles; barrel chested.  HEART/CVS: regular rate & rhythm and no murmurs; No lower extremity edema ABDOMEN: abdomen soft, non-tender and normal bowel sounds; positive for PEG tube.  Musculoskeletal:no cyanosis of digits and no clubbing  PSYCH: alert & oriented x 3.  NEURO: no focal motor/sensory deficits SKIN:  no rashes or significant lesions  LABORATORY DATA:  I have reviewed the data as  listed Lab Results  Component Value Date   WBC 9.7 03/20/2016   HGB 8.9 (L) 03/20/2016   HCT 25.4 (L) 03/20/2016   MCV 103.9 (H) 03/20/2016   PLT 245 03/20/2016    Recent Labs  01/23/16 0957 02/13/16 0911  03/07/16 0010 03/08/16 0431 03/20/16 1140  NA 126* 127*  < > 127* 129* 130*  K 3.8 3.5  < > 4.2 3.8 4.1  CL 90* 92*  < > 89* 95* 91*  CO2 29 28  < > 25 29 28   GLUCOSE 98 158*  < > 94 143* 183*  BUN 12 13  < > 18 12 21*  CREATININE 0.61 0.60  < > 0.62 0.52 0.73  CALCIUM 8.5* 8.7*  < > 9.2 8.1* 9.1  GFRNONAA >60 >60  < > >60 >60 >60  GFRAA >60 >60  < > >60 >60 >60  PROT 6.5 6.3*  --   --   --  7.4  ALBUMIN 3.4* 3.3*  --   --   --  3.6  AST 17 21  --   --   --  22  ALT 13* 12*  --   --   --  16  ALKPHOS 70 75  --   --   --  75  BILITOT 0.4 0.5  --   --   --  0.9  < > = values in this interval not displayed.  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 Poor tolerance to chemotherapy carbotaxol approximately 4 weeks ago. Patient declines further chemotherapy.  # Pain-currently on  morphine liquid prn. Stable.  # Cough likely secondary to  aspiration- recommend use of tusionex; no prescription given.  # hypothyroidism recent TSH 8; on snthroid.   # Reviewed/counselled regarding the goals of care- being palliative/  Recommend continued hospice. Discussed that life expectancy is in order of few months- depends upon rate of progression of disease.    # no follow up.    Cammie Sickle, MD   03/20/2016 12:46 PM  .

## 2016-04-01 ENCOUNTER — Ambulatory Visit: Payer: Commercial Managed Care - HMO | Admitting: Radiation Oncology

## 2016-04-04 ENCOUNTER — Other Ambulatory Visit: Payer: Self-pay | Admitting: Nurse Practitioner

## 2016-04-16 DEATH — deceased

## 2017-12-16 IMAGING — PT NM PET TUM IMG INITIAL (PI) SKULL BASE T - THIGH
10 series · 24 of 25 positions shown · non-contrast
Comparison: Neck CT of 06/13/2015. Abdominopelvic CT of 12/29/2006.

CLINICAL DATA: Initial treatment strategy for left-sided tongue
cancer. No recent chemotherapy or radiation therapy..

EXAM:
NUCLEAR MEDICINE PET SKULL BASE TO THIGH
TECHNIQUE: 12.2 mCi F-18 FDG was injected intravenously. Full-ring PET imaging
was performed from the skull base to thigh after the radiotracer. CT
data was obtained and used for attenuation correction and anatomic
localization.
FASTING BLOOD GLUCOSE:  Value: 89 mg/dl

[Series 3: ct wb 5.0 b30f · axial · 5.0mm · 0.98mm/px · z∈[-1311,-561]mm · 3 of 251 slices shown]
[im 1/251]
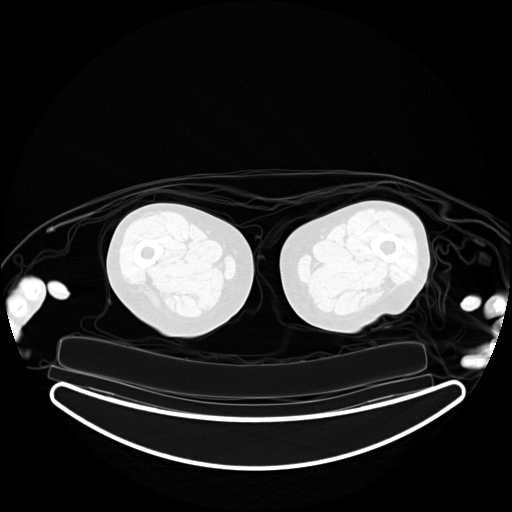
[im 126/251]
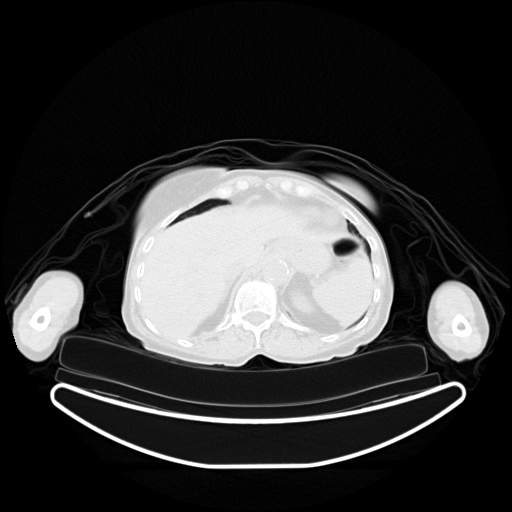
[im 251/251  brain]
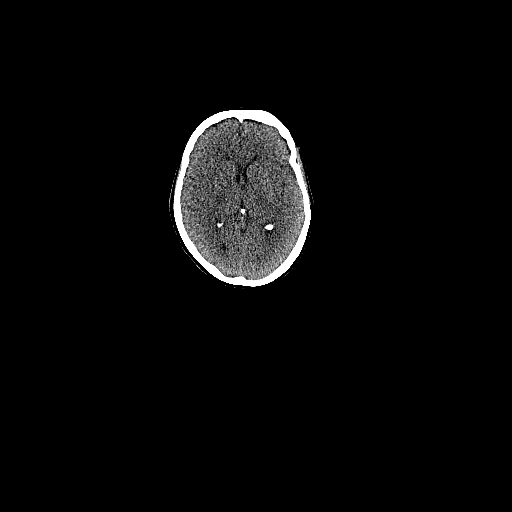

[Series 4: pet wb (ac) · axial · 5.0mm · 4.07mm/px · z∈[-1311,-561]mm · 3 of 251 slices shown]
[im 1/251]
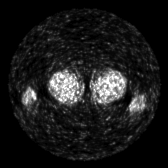
[im 126/251]
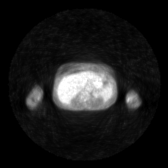
[im 251/251]
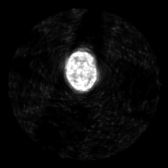

[Series 5: pet wb uncorrected (nac) · axial · 5.0mm · 4.07mm/px · z∈[-1311,-561]mm · 3 of 251 slices shown]
[im 1/251]
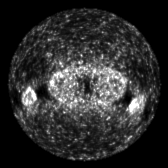
[im 126/251]
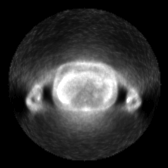
[im 251/251]
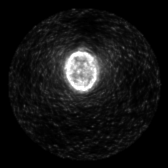

[Series 603: pet/ct axial fused · 3 of 250 slices shown]
[im 1/250]
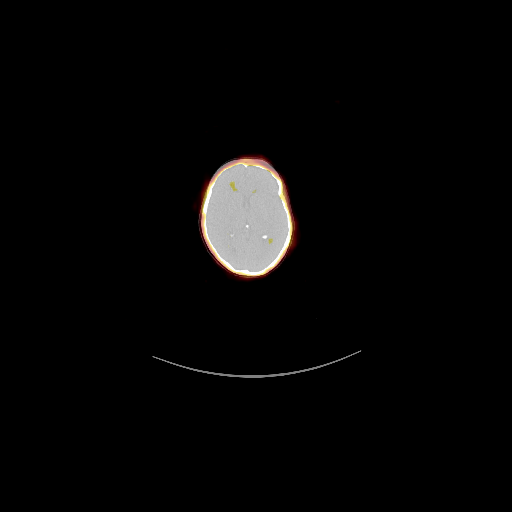
[im 84/250]
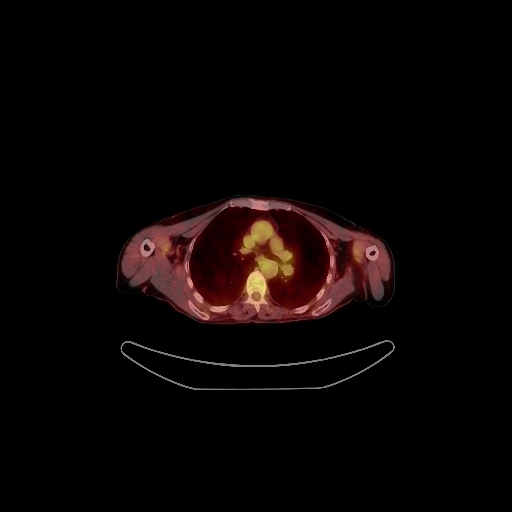
[im 167/250]
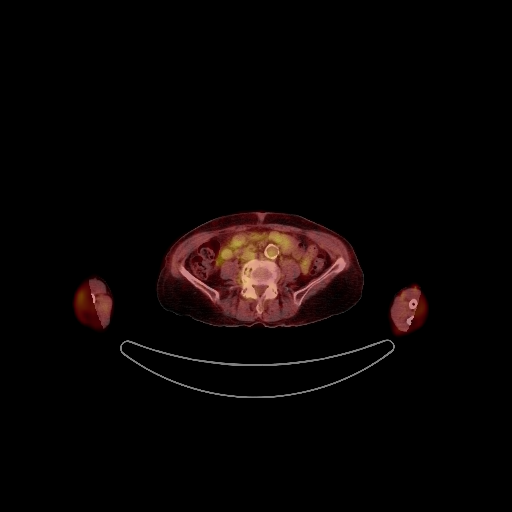

[Series 604: pet/ct coronal fused · 1 of 65 slices shown]
[im 1/65]
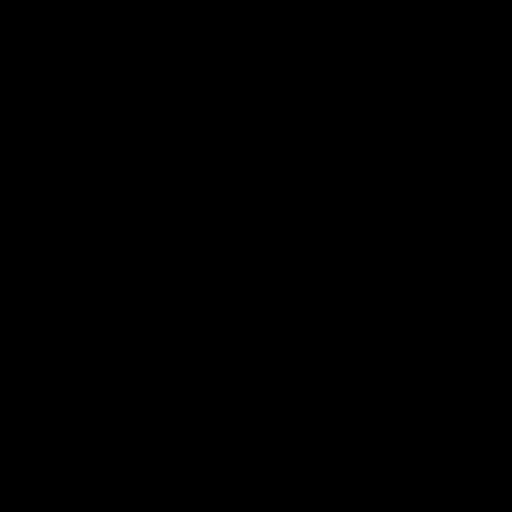

[Series 605: pet/ct sagittal fused · 2 of 173 slices shown]
[im 1/173]
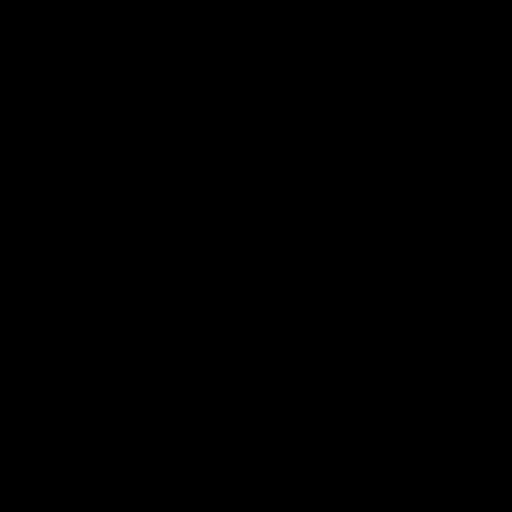
[im 173/173]
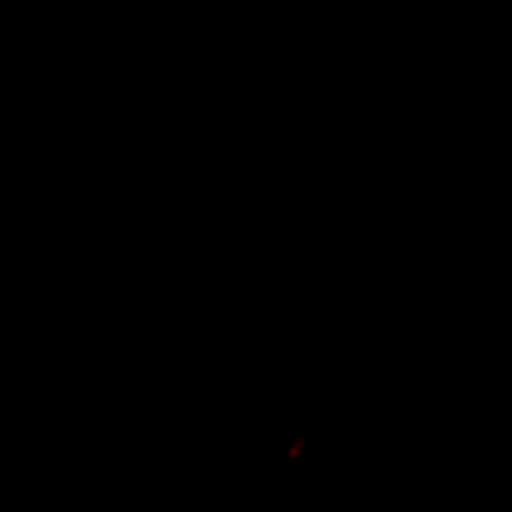

[Series 606: pet axial · 4 of 250 slices shown]
[im 1/250]
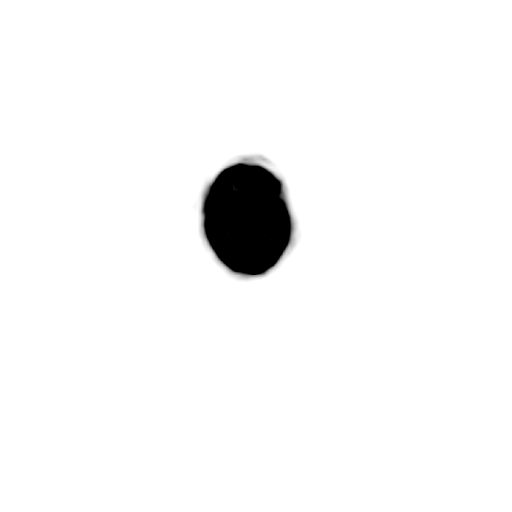
[im 84/250]
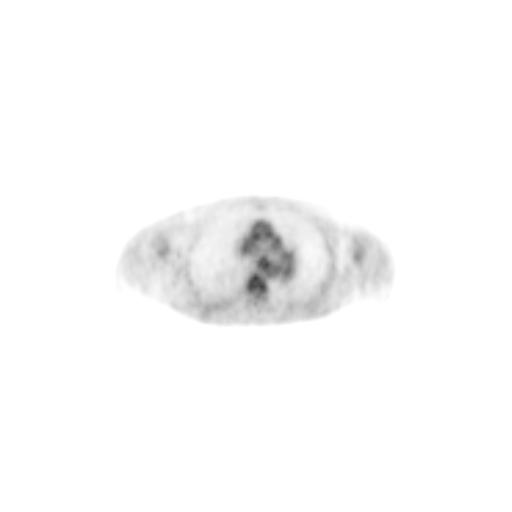
[im 167/250]
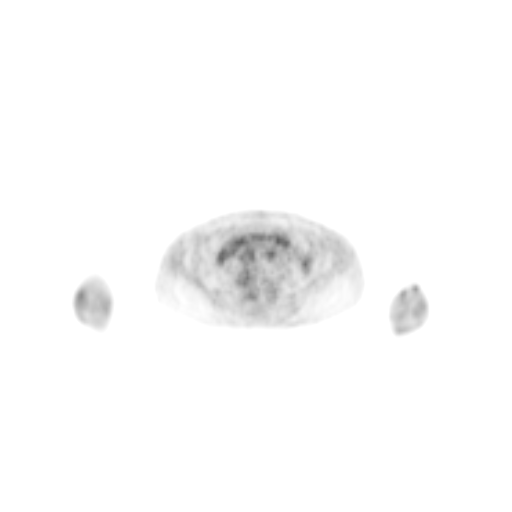
[im 250/250]
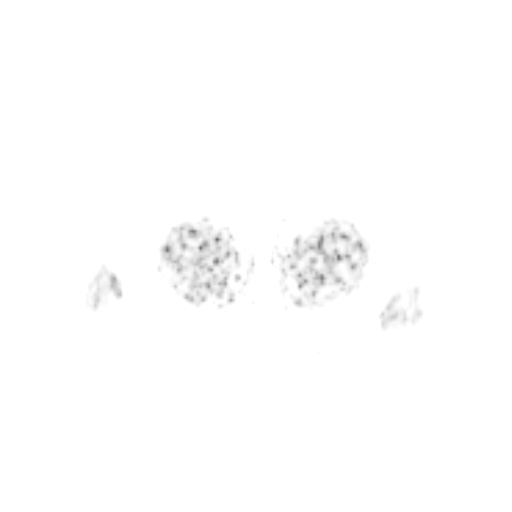

[Series 607: pet coronal · 1 of 92 slices shown]
[im 1/92]
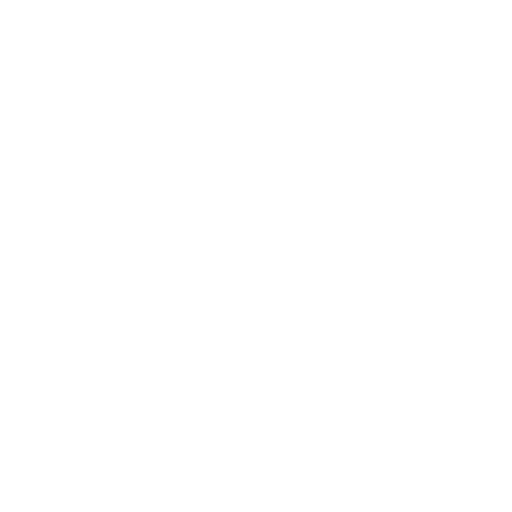

[Series 608: pet sagittal · 3 of 183 slices shown]
[im 1/183]
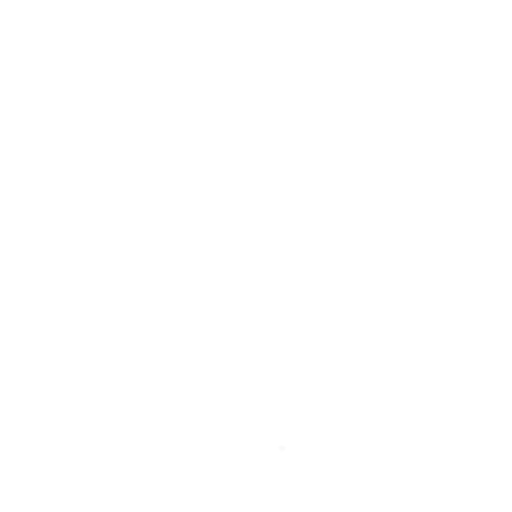
[im 92/183]
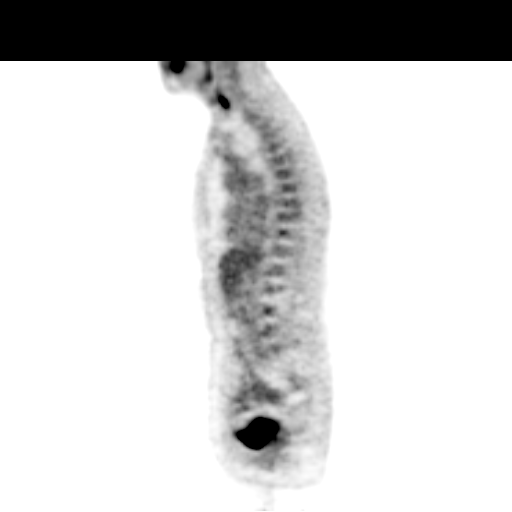
[im 183/183]
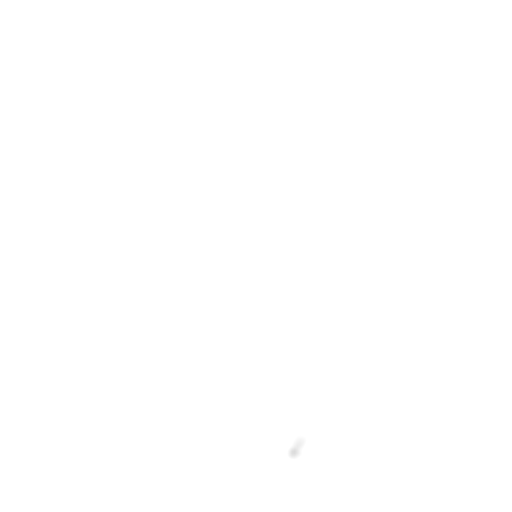

[Series 1106: results mm oncology reading · 0.47mm/px · 1 of 3 slices shown]
[im 1/3]
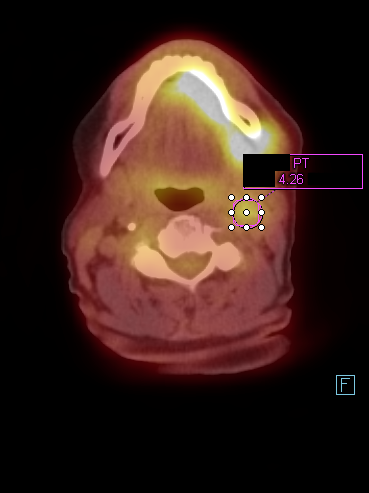

[24 of 25 positions shown; findings below may reference images not displayed]

FINDINGS: NECK

Left floor of mouth primary. This measures a S.U.V. max of 9.9,
including on image 34/series 3.

An adjacent left submandibular mass is favored to represent localize
nodal metastasis, possibly with a component of direct tumor
extension. This measures 4.3 x 3.3 cm and a S.U.V. max of 12.1. The
hypermetabolism is identified medially, with necrosis identified
laterally.

Hypermetabolic nodule or node in the left parotid gland measures 7
mm and a S.U.V. max of 3.9 on image 30/series 3.

Left level 2 hypermetabolic nodes, including a centrally necrotic
1.0 cm node which measures a S.U.V. max of 4.3 on image 35/series 3.

CHEST

No areas of abnormal hypermetabolism.

ABDOMEN/PELVIS

No areas of abnormal hypermetabolism.

SKELETON

No abnormal marrow activity.

CT IMAGES PERFORMED FOR ATTENUATION CORRECTION

Neck findings deferred to recent diagnostic CT.

Thoracic aortic and great vessel atherosclerosis. Multivessel
coronary artery atherosclerosis. Mild centrilobular and paraseptal
emphysema. Normal adrenal glands. Renal vascular calcifications. Non
aneurysmal dilatation of the infrarenal abdominal aorta at maximally
2.2 cm. Presumed calcified wall thrombus within. Hysterectomy. Mild
pelvic floor laxity. Osteopenia.
IMPRESSION: 1. Left-sided floor of mouth primary with left-sided submandibular
and level 2 jugular chain nodal metastasis. A left parotid
hypermetabolic nodule is favored to represent metastasis. Concurrent
parotid neoplasm felt less likely.
2. No extracervical hypermetabolic metastatic disease identified.
3. Advanced atherosclerosis, including within the coronary arteries.

## 2018-01-20 IMAGING — CR DG THORACIC SPINE 2V
1 series · 2 of 2 positions shown · non-contrast
Comparison: None.

CLINICAL DATA: Pain following fall.  History of tongue carcinoma

EXAM:
THORACIC SPINE 2 VIEWS

[Series 1: t thoracic spine ap · 0.14mm/px · 2 of 2 slices shown]
[im 1/2]
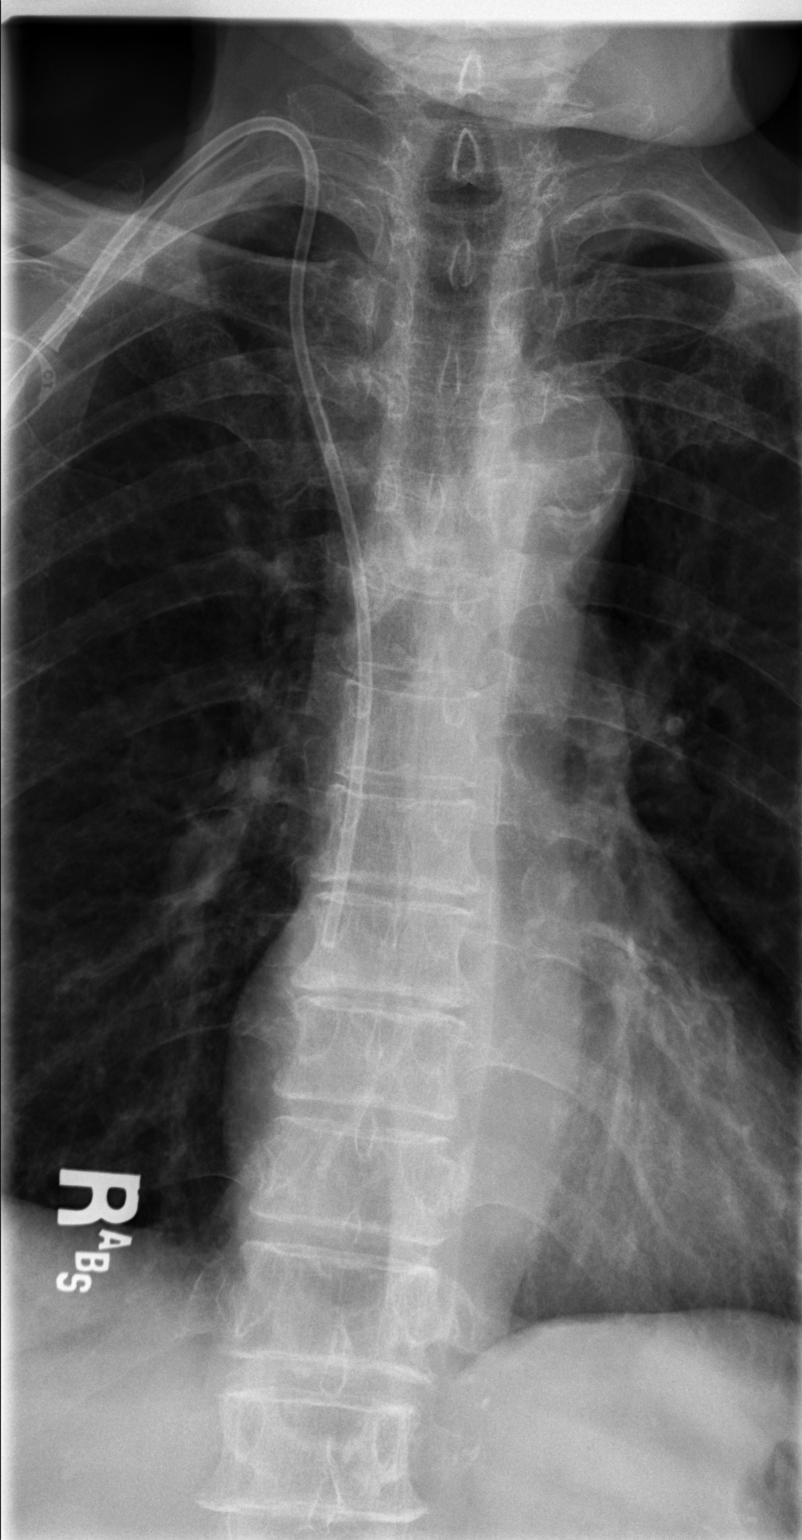
[im 2/2]
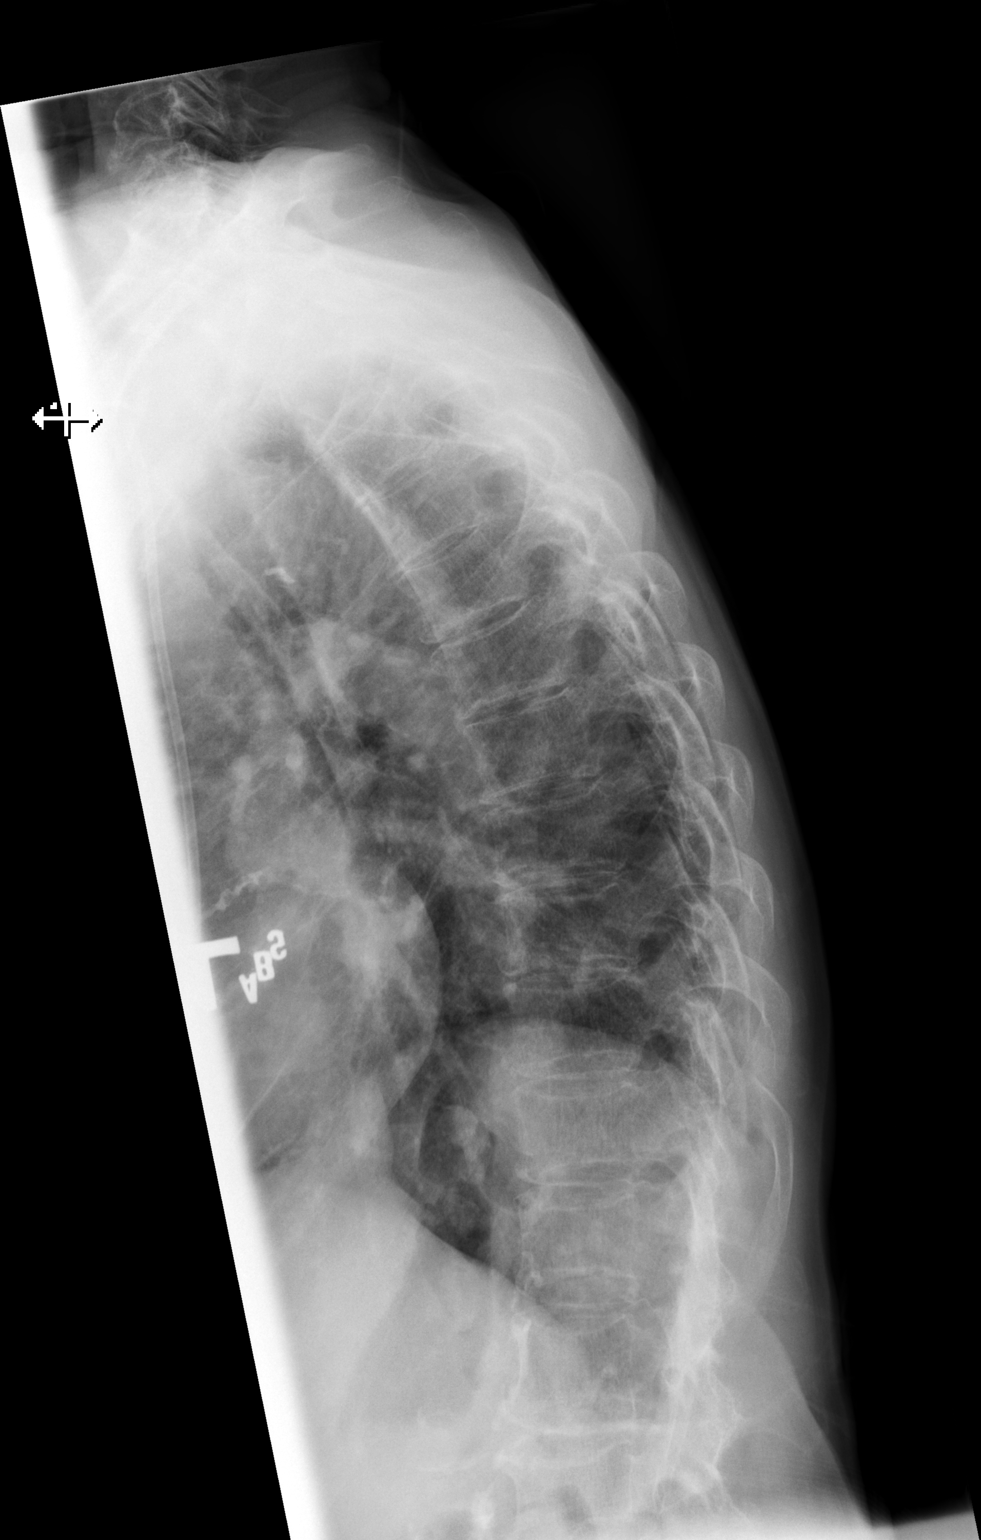

[2 of 2 positions shown; findings below may reference images not displayed]

FINDINGS: Frontal and lateral views were obtained. There is no fracture or
spondylolisthesis. There is mild disc space narrowing at multiple
levels. No erosive change or paraspinous lesion. Port-A-Cath tip is
in the superior vena cava.
IMPRESSION: Osteoarthritic changes several levels. No erosive change. No
fracture or spondylolisthesis.
